# Patient Record
Sex: Female | Born: 1960 | Race: White | Hispanic: No | Marital: Married | Smoking: Former smoker
Health system: Southern US, Community
[De-identification: ages and names within clinical notes are randomized; demographics above are authoritative.]

## PROBLEM LIST (undated history)

## (undated) DIAGNOSIS — E559 Vitamin D deficiency, unspecified: Secondary | ICD-10-CM

## (undated) DIAGNOSIS — M810 Age-related osteoporosis without current pathological fracture: Secondary | ICD-10-CM

## (undated) DIAGNOSIS — R778 Other specified abnormalities of plasma proteins: Secondary | ICD-10-CM

## (undated) DIAGNOSIS — J439 Emphysema, unspecified: Secondary | ICD-10-CM

## (undated) DIAGNOSIS — F419 Anxiety disorder, unspecified: Secondary | ICD-10-CM

## (undated) DIAGNOSIS — E041 Nontoxic single thyroid nodule: Secondary | ICD-10-CM

## (undated) DIAGNOSIS — M2550 Pain in unspecified joint: Secondary | ICD-10-CM

## (undated) DIAGNOSIS — N809 Endometriosis, unspecified: Secondary | ICD-10-CM

## (undated) DIAGNOSIS — N6019 Diffuse cystic mastopathy of unspecified breast: Secondary | ICD-10-CM

## (undated) DIAGNOSIS — N879 Dysplasia of cervix uteri, unspecified: Secondary | ICD-10-CM

## (undated) DIAGNOSIS — J449 Chronic obstructive pulmonary disease, unspecified: Secondary | ICD-10-CM

## (undated) DIAGNOSIS — K219 Gastro-esophageal reflux disease without esophagitis: Secondary | ICD-10-CM

## (undated) HISTORY — DX: Gastro-esophageal reflux disease without esophagitis: K21.9

## (undated) HISTORY — DX: Pain in unspecified joint: M25.50

## (undated) HISTORY — DX: Diffuse cystic mastopathy of unspecified breast: N60.19

## (undated) HISTORY — DX: Endometriosis, unspecified: N80.9

## (undated) HISTORY — DX: Emphysema, unspecified: J43.9

## (undated) HISTORY — DX: Age-related osteoporosis without current pathological fracture: M81.0

## (undated) HISTORY — PX: OTHER SURGICAL HISTORY: SHX169

## (undated) HISTORY — DX: Chronic obstructive pulmonary disease, unspecified: J44.9

## (undated) HISTORY — DX: Nontoxic single thyroid nodule: E04.1

## (undated) HISTORY — DX: Vitamin D deficiency, unspecified: E55.9

## (undated) HISTORY — DX: Other specified abnormalities of plasma proteins: R77.8

## (undated) HISTORY — DX: Anxiety disorder, unspecified: F41.9

## (undated) HISTORY — DX: Dysplasia of cervix uteri, unspecified: N87.9

---

## 1986-01-23 HISTORY — PX: TUBAL LIGATION: SHX77

## 1999-01-24 ENCOUNTER — Other Ambulatory Visit: Admission: RE | Admit: 1999-01-24 | Discharge: 1999-01-24 | Payer: Self-pay | Admitting: Obstetrics & Gynecology

## 2000-02-02 ENCOUNTER — Other Ambulatory Visit: Admission: RE | Admit: 2000-02-02 | Discharge: 2000-02-02 | Payer: Self-pay | Admitting: Obstetrics & Gynecology

## 2000-02-02 ENCOUNTER — Encounter: Admission: RE | Admit: 2000-02-02 | Discharge: 2000-02-02 | Payer: Self-pay | Admitting: Obstetrics & Gynecology

## 2000-02-02 ENCOUNTER — Encounter: Payer: Self-pay | Admitting: Obstetrics & Gynecology

## 2000-02-06 ENCOUNTER — Encounter: Payer: Self-pay | Admitting: Obstetrics & Gynecology

## 2000-02-06 ENCOUNTER — Encounter: Admission: RE | Admit: 2000-02-06 | Discharge: 2000-02-06 | Payer: Self-pay | Admitting: Obstetrics & Gynecology

## 2000-04-17 ENCOUNTER — Other Ambulatory Visit: Admission: RE | Admit: 2000-04-17 | Discharge: 2000-04-17 | Payer: Self-pay | Admitting: Obstetrics & Gynecology

## 2000-04-17 ENCOUNTER — Encounter (INDEPENDENT_AMBULATORY_CARE_PROVIDER_SITE_OTHER): Payer: Self-pay

## 2000-10-11 ENCOUNTER — Other Ambulatory Visit: Admission: RE | Admit: 2000-10-11 | Discharge: 2000-10-11 | Payer: Self-pay | Admitting: Obstetrics & Gynecology

## 2001-05-29 ENCOUNTER — Other Ambulatory Visit: Admission: RE | Admit: 2001-05-29 | Discharge: 2001-05-29 | Payer: Self-pay | Admitting: Obstetrics & Gynecology

## 2002-03-05 ENCOUNTER — Encounter: Payer: Self-pay | Admitting: Obstetrics & Gynecology

## 2002-03-05 ENCOUNTER — Encounter: Admission: RE | Admit: 2002-03-05 | Discharge: 2002-03-05 | Payer: Self-pay | Admitting: Obstetrics & Gynecology

## 2002-06-19 ENCOUNTER — Other Ambulatory Visit: Admission: RE | Admit: 2002-06-19 | Discharge: 2002-06-19 | Payer: Self-pay | Admitting: Obstetrics and Gynecology

## 2003-02-25 ENCOUNTER — Other Ambulatory Visit: Admission: RE | Admit: 2003-02-25 | Discharge: 2003-02-25 | Payer: Self-pay | Admitting: Obstetrics and Gynecology

## 2003-08-26 ENCOUNTER — Encounter: Admission: RE | Admit: 2003-08-26 | Discharge: 2003-08-26 | Payer: Self-pay | Admitting: Obstetrics and Gynecology

## 2004-07-18 ENCOUNTER — Other Ambulatory Visit: Admission: RE | Admit: 2004-07-18 | Discharge: 2004-07-18 | Payer: Self-pay | Admitting: Obstetrics and Gynecology

## 2005-07-10 ENCOUNTER — Ambulatory Visit: Payer: Self-pay | Admitting: Internal Medicine

## 2005-07-16 ENCOUNTER — Ambulatory Visit: Payer: Self-pay | Admitting: Internal Medicine

## 2005-08-02 ENCOUNTER — Other Ambulatory Visit: Admission: RE | Admit: 2005-08-02 | Discharge: 2005-08-02 | Payer: Self-pay | Admitting: Obstetrics and Gynecology

## 2005-08-21 ENCOUNTER — Ambulatory Visit: Payer: Self-pay | Admitting: Internal Medicine

## 2009-08-02 ENCOUNTER — Encounter: Admission: RE | Admit: 2009-08-02 | Discharge: 2009-08-02 | Payer: Self-pay | Admitting: Family Medicine

## 2009-08-02 ENCOUNTER — Other Ambulatory Visit: Admission: RE | Admit: 2009-08-02 | Discharge: 2009-08-02 | Payer: Self-pay | Admitting: Interventional Radiology

## 2009-11-21 ENCOUNTER — Encounter: Admission: RE | Admit: 2009-11-21 | Discharge: 2009-11-21 | Payer: Self-pay | Admitting: Surgery

## 2010-03-08 ENCOUNTER — Ambulatory Visit (HOSPITAL_COMMUNITY): Admission: RE | Admit: 2010-03-08 | Discharge: 2010-03-09 | Payer: Self-pay | Admitting: Surgery

## 2010-03-08 ENCOUNTER — Encounter (INDEPENDENT_AMBULATORY_CARE_PROVIDER_SITE_OTHER): Payer: Self-pay | Admitting: Surgery

## 2010-03-08 HISTORY — PX: THYROID LOBECTOMY: SHX420

## 2010-05-19 ENCOUNTER — Encounter: Admission: RE | Admit: 2010-05-19 | Discharge: 2010-05-19 | Payer: Self-pay | Admitting: Obstetrics and Gynecology

## 2010-11-12 LAB — COMPREHENSIVE METABOLIC PANEL
Alkaline Phosphatase: 96 U/L (ref 39–117)
BUN: 7 mg/dL (ref 6–23)
Chloride: 104 mEq/L (ref 96–112)
Creatinine, Ser: 0.64 mg/dL (ref 0.4–1.2)
Glucose, Bld: 90 mg/dL (ref 70–99)
Potassium: 4.1 mEq/L (ref 3.5–5.1)
Total Bilirubin: 0.4 mg/dL (ref 0.3–1.2)
Total Protein: 7.6 g/dL (ref 6.0–8.3)

## 2010-11-12 LAB — SURGICAL PCR SCREEN
MRSA, PCR: NEGATIVE
Staphylococcus aureus: POSITIVE — AB

## 2010-11-12 LAB — CALCIUM: Calcium: 8.1 mg/dL — ABNORMAL LOW (ref 8.4–10.5)

## 2011-01-12 NOTE — H&P (Signed)
NAME:  Megan Harper, Megan Harper                         ACCOUNT NO.:  [id number]   MEDICAL RECORD NO.:  [id number]                   PATIENT TYPE:  AMB   LOCATION:  [facility]                                  FACILITY:  [facility]   PHYSICIAN:  [place] B. Earlene Plater, M.D.               DATE OF BIRTH:  April 09, 1961   DATE OF ADMISSION:  DATE OF DISCHARGE:                                HISTORY & PHYSICAL   PREOPERATIVE DIAGNOSES:  1. Persistent complex right ovarian cyst.  2. Bilateral lower quadrant abdominal pain.  3. History of endometriosis.   PLANNED PROCEDURE:  Open laparoscopy, right ovarian cystectomy, possible  right salpingo-oophorectomy.   HISTORY OF PRESENT ILLNESS:  A 50 year old white female gravida 3, para 3  with a history of severe dysmenorrhea and associated right lower quadrant  pain which has been increased in the last month.  Recent ultrasound in the  office showed complex right ovarian cyst consistent with endometrioma by  appearance.  The patient with a previous history of endometriosis and  desires surgical therapy as she is taking nonsteroidal pain medications  without relief.   PAST MEDICAL HISTORY:  1. Endometriosis.  2. Cervical dysplasia.  3. Previous urinary tract infection.  4. Fibrocystic breast disease.   PAST SURGICAL HISTORY:  Laparoscopy for endometriosis.   FAMILY HISTORY:  Heart disease, hypertension and mother with cervical  cancer.  Father with lung cancer.   SOCIAL HISTORY:  The patient does smoke 1-1/2 per day.  Occasional alcohol.  No other drugs.   ALLERGIES:  CECLOR.   MEDICATIONS:  Prevacid.   REVIEW OF SYSTEMS:  Otherwise noncontributory.   PHYSICAL EXAMINATION:  VITAL SIGNS:  Blood pressure 120/70.  Pulse 68.  Temperature 98.8.  GENERAL:  The patient is alert and oriented in no acute distress.  SKIN:  Warm, dry, no lesions.  HEART:  Regular rate and rhythm.  LUNGS:  Clear to auscultation.  ABDOMEN:  Liver and spleen normal.  No hernia.  No acute  signs of mild  bilateral lower quadrant tenderness.  Previous laparoscopy and tubal  ligation scar noted.  PELVIC:  Normal external genitalia.  Vagina: small amount of white discharge  noted.  Wet prep showed yeast in the office.  Bimanual exam:  The uterus is  felt enlarged and slightly tender.  No true adnexal mass or tenderness  palpable.   ASSESSMENT:  Abdominal pain with associated complex right ovarian cyst  consistent with endometriosis.  Not responding to Bextra and not a candidate  for contraceptive management as she is a smoker over age 62.  Therefore, she  wishes to proceed with laparoscopy and right ovarian cystectomy.  Also,  discussed with the patient possible need for right salpingo-oophorectomy.  Operative risks discussed including infection, bleeding, damage to  gallbladder and surrounding organs.  All questions were answered and the  patient wished to proceed.  [name] B. Earlene Plater, M.D.    WBD/MEDQ  D:  05/21/2002  T:  05/21/2002  Job:  [phone]

## 2011-04-18 ENCOUNTER — Other Ambulatory Visit: Payer: Self-pay | Admitting: Obstetrics and Gynecology

## 2013-01-09 ENCOUNTER — Encounter: Payer: Self-pay | Admitting: Internal Medicine

## 2013-02-04 ENCOUNTER — Encounter: Payer: Self-pay | Admitting: Internal Medicine

## 2013-02-04 ENCOUNTER — Ambulatory Visit (INDEPENDENT_AMBULATORY_CARE_PROVIDER_SITE_OTHER): Payer: 59 | Admitting: Internal Medicine

## 2013-02-04 VITALS — BP 104/62 | HR 68 | Ht 66.25 in | Wt 175.0 lb

## 2013-02-04 DIAGNOSIS — R1011 Right upper quadrant pain: Secondary | ICD-10-CM

## 2013-02-04 DIAGNOSIS — R1013 Epigastric pain: Secondary | ICD-10-CM

## 2013-02-04 DIAGNOSIS — R141 Gas pain: Secondary | ICD-10-CM

## 2013-02-04 DIAGNOSIS — K219 Gastro-esophageal reflux disease without esophagitis: Secondary | ICD-10-CM

## 2013-02-04 DIAGNOSIS — K5900 Constipation, unspecified: Secondary | ICD-10-CM

## 2013-02-04 DIAGNOSIS — R1310 Dysphagia, unspecified: Secondary | ICD-10-CM

## 2013-02-04 MED ORDER — MOVIPREP 100 G PO SOLR: 1.0000 | Freq: Once | ORAL | Status: DC

## 2013-02-04 NOTE — Patient Instructions (Addendum)
   You have been scheduled for an abdominal ultrasound at [facility] (1st floor of hospital) on 02-06-13 at 9:00am. Please arrive 15 minutes prior to your appointment for registration. Make certain not to have anything to eat or drink after midnight prior to your appointment. Should you need to reschedule your appointment, please contact radiology at [phone]. This test typically takes about 30 minutes to perform.   You have been scheduled for an endoscopy and colonoscopy with propofol. Please follow the written instructions given to you at your visit today.  Please pick up your prep at the pharmacy within the next 1-3 days.  If you use inhalers (even only as needed), please bring them with you on the day of your procedure.  Your physician has requested that you go to www.startemmi.com and enter the access code given to you at your visit today. This web site gives a general overview about your procedure. However, you should still follow specific instructions given to you by our office regarding your preparation for the procedure.  Continue taking your Prilosec - make sure to take it daily.  You have been given a reflux

## 2013-02-04 NOTE — Progress Notes (Signed)
HISTORY OF PRESENT ILLNESS:  Megan Harper is a 52 y.o. female with postop hypothyroidism, anxiety, GERD, and IBS. Patient has not been seen in this office since 2006. Previous evaluations included a normal upper endoscopy and flexible sigmoidoscopy in 1999. As well, normal abdominal ultrasound 2006. She presents today with a myriad of GI complaints. First, chronic bloating which has worsened in recent months. Associated with this has been a tendency toward constipation with bowel movements every few days. Next, she reports reflux symptoms if she misses doses of PPI. She does report an episode of regurgitation with coughing at night. She is concerned about halitosis. She is also had some intermittent food and pill dysphagia for 6 months. Finally, she reports a one-year history of recurrent epigastric and right upper cautery pain with radiation to the back. She has this several times per week it last for several hours. She cannot identify any exacerbating or relieving factors. She denies melena or hematochezia. She has had over 30 pound weight gain since 2006. Review of outside laboratories from January 2014 reveal normal CBC, comprehensive metabolic panel, and thyroid testing . REVIEW OF SYSTEMS:  All non-GI ROS negative except for anxiety, back pain, confusion, fatigue, shortness of breath, itching, leg swelling, excessive urination  Past Medical History  Diagnosis Date  . GERD (gastroesophageal reflux disease)   . Endometriosis   . Fibrocystic breast disease   . Cervical dysplasia   . Thyroid nodule   . Anxiety   . COPD (chronic obstructive pulmonary disease)     Past Surgical History  Procedure Laterality Date  . Laproscopy for endometriosis    . Thyroid lobectomy Right 03/08/2010  . Tubal ligation  01/23/1986    Social History SIBBIE FLAMMIA  reports that she quit smoking about 3 years ago. Her smoking use included Cigarettes. She has a 77.5 pack-year smoking history. She has never  used smokeless tobacco. She reports that she does not drink alcohol or use illicit drugs.  family history includes Colon cancer in her maternal grandfather; Diabetes in her paternal grandmother; Heart disease in her maternal grandmother, maternal uncle, and paternal grandmother; Hypertension in her maternal grandfather and maternal grandmother; Kidney cancer in her maternal grandfather and maternal uncle; Lung cancer in her father; and Uterine cancer in her mother.  No Known Allergies     PHYSICAL EXAMINATION: Vital signs: BP 104/62  Pulse 68  Ht 5' 6.25" (1.683 m)  Wt 175 lb (79.379 kg)  BMI 28.02 kg/m2  Constitutional: Obese, generally well-appearing, no acute distress Psychiatric: alert and oriented x3, cooperative Eyes: extraocular movements intact, anicteric, conjunctiva pink Mouth: oral pharynx moist, no lesions Neck: supple no lymphadenopathy Cardiovascular: heart regular rate and rhythm, no murmur Lungs: clear to auscultation bilaterally Abdomen: soft, obese, nontender, nondistended, no obvious ascites, no peritoneal signs, normal bowel sounds, no organomegaly Rectal: Deferred until colonoscopy Extremities: no lower extremity edema bilaterally Skin: no lesions on visible extremities Neuro: No focal deficits.   ASSESSMENT:  #1. Constipation predominant IBS #2. Colon cancer screening. Appropriate candidate for screening colonoscopy without contraindication. No prior colonoscopy #3. GERD. #4. Intermittent solid food/pill dysphagia. Rule out stricture #5. Intermittent epigastric and right upper quadrant pain. Rule out stones.   PLAN:  #1. Reflux precautions with attention to weight loss #2. Take PPI dosage daily. May have to increase dose if breakthrough occurs on regular medication #3. Upper endoscopy to evaluate recurrent abdominal pain as well as new onset dysphagia. Possible esophageal dilation.The nature of the procedure, as well  as the risks, benefits, and  alternatives were carefully and thoroughly reviewed with the patient. Ample time for discussion and questions allowed. The patient understood, was satisfied, and agreed to proceed. #4. Screening colonoscopy.The nature of the procedure, as well as the risks, benefits, and alternatives were carefully and thoroughly reviewed with the patient. Ample time for discussion and questions allowed. The patient understood, was satisfied, and agreed to proceed. We will perform this at the same time endoscopy for her convenience. #5. Movi prep prescribed. The patient instructed on his use #6. Abdominal ultrasound rule out gallstones #7. Consider Linzess for constipation predominant IBS after endoscopic evaluations completed

## 2013-02-06 ENCOUNTER — Telehealth: Payer: Self-pay | Admitting: Internal Medicine

## 2013-02-06 ENCOUNTER — Ambulatory Visit (HOSPITAL_COMMUNITY)
Admission: RE | Admit: 2013-02-06 | Discharge: 2013-02-06 | Disposition: A | Discharge status: Home / Self Care | Payer: 59 | Source: Ambulatory Visit | Attending: Internal Medicine | Admitting: Internal Medicine

## 2013-02-06 DIAGNOSIS — R1310 Dysphagia, unspecified: Secondary | ICD-10-CM

## 2013-02-06 DIAGNOSIS — R141 Gas pain: Secondary | ICD-10-CM

## 2013-02-06 DIAGNOSIS — K219 Gastro-esophageal reflux disease without esophagitis: Secondary | ICD-10-CM

## 2013-02-06 DIAGNOSIS — R1011 Right upper quadrant pain: Secondary | ICD-10-CM

## 2013-02-06 DIAGNOSIS — R1013 Epigastric pain: Secondary | ICD-10-CM

## 2013-02-06 DIAGNOSIS — K5900 Constipation, unspecified: Secondary | ICD-10-CM

## 2013-02-06 NOTE — Telephone Encounter (Signed)
LM on VM.

## 2013-02-09 NOTE — Telephone Encounter (Signed)
Told patient I would mail a moviprep voucher to her.  Patient acknowledged and understood

## 2013-02-09 NOTE — Telephone Encounter (Signed)
Called and gave pt results of ultrasound. Pt states she was waiting to hear back regarding moviprep alternative.

## 2013-03-09 ENCOUNTER — Encounter: Payer: 59 | Admitting: Internal Medicine

## 2013-04-23 ENCOUNTER — Other Ambulatory Visit: Payer: Self-pay | Admitting: Obstetrics & Gynecology

## 2014-04-30 ENCOUNTER — Other Ambulatory Visit: Payer: Self-pay | Admitting: Obstetrics and Gynecology

## 2014-05-04 LAB — CYTOLOGY - PAP

## 2014-07-19 ENCOUNTER — Other Ambulatory Visit (HOSPITAL_COMMUNITY): Payer: Self-pay | Admitting: Respiratory Therapy

## 2014-07-19 DIAGNOSIS — J441 Chronic obstructive pulmonary disease with (acute) exacerbation: Secondary | ICD-10-CM

## 2014-07-21 ENCOUNTER — Encounter: Payer: Self-pay | Admitting: Internal Medicine

## 2014-08-03 ENCOUNTER — Ambulatory Visit (HOSPITAL_COMMUNITY)
Admission: RE | Admit: 2014-08-03 | Discharge: 2014-08-03 | Disposition: A | Discharge status: Home / Self Care | Payer: 59 | Source: Ambulatory Visit | Attending: Family Medicine | Admitting: Family Medicine

## 2014-08-03 DIAGNOSIS — J441 Chronic obstructive pulmonary disease with (acute) exacerbation: Secondary | ICD-10-CM | POA: Insufficient documentation

## 2014-08-03 LAB — PULMONARY FUNCTION TEST
DL/VA % PRED: 102 %
DL/VA: 5.16 ml/min/mmHg/L
DLCO COR: 10.66 ml/min/mmHg
DLCO UNC % PRED: 39 %
DLCO UNC: 10.66 ml/min/mmHg
DLCO cor % pred: 39 %
FEF 25-75 POST: 1.27 L/s
FEF 25-75 PRE: 0.82 L/s
FEF2575-%Change-Post: 53 %
FEF2575-%PRED-POST: 45 %
FEF2575-%Pred-Pre: 29 %
FEV1-%Change-Post: 10 %
FEV1-%Pred-Post: 57 %
FEV1-%Pred-Pre: 52 %
FEV1-PRE: 1.55 L
FEV1-Post: 1.7 L
FEV1FVC-%Change-Post: 5 %
FEV1FVC-%PRED-PRE: 83 %
FEV6-%CHANGE-POST: 6 %
FEV6-%PRED-POST: 64 %
FEV6-%Pred-Pre: 61 %
FEV6-POST: 2.35 L
FEV6-Pre: 2.21 L
FEV6FVC-%CHANGE-POST: 2 %
FEV6FVC-%PRED-POST: 101 %
FEV6FVC-%Pred-Pre: 99 %
FVC-%CHANGE-POST: 4 %
FVC-%PRED-POST: 64 %
FVC-%PRED-PRE: 61 %
FVC-POST: 2.4 L
FVC-Pre: 2.31 L
PRE FEV1/FVC RATIO: 67 %
Post FEV1/FVC ratio: 71 %
Post FEV6/FVC ratio: 98 %
Pre FEV6/FVC Ratio: 96 %
RV % pred: 122 %
RV: 2.37 L
TLC % pred: 89 %
TLC: 4.8 L

## 2014-08-03 MED ORDER — ALBUTEROL SULFATE (2.5 MG/3ML) 0.083% IN NEBU
2.5000 mg | INHALATION_SOLUTION | Freq: Once | RESPIRATORY_TRACT | Status: AC
  Administered 2014-08-03: 2.5 mg via RESPIRATORY_TRACT

## 2014-09-13 ENCOUNTER — Ambulatory Visit: Payer: 59

## 2014-09-13 VITALS — Ht 66.5 in | Wt 168.0 lb

## 2014-09-13 DIAGNOSIS — R1013 Epigastric pain: Secondary | ICD-10-CM

## 2014-09-13 DIAGNOSIS — K219 Gastro-esophageal reflux disease without esophagitis: Secondary | ICD-10-CM

## 2014-09-13 NOTE — Progress Notes (Signed)
No allergies to eggs or soy No past problems with anesthesia No home oxygen No diet/weight loss meds  Has email  Emmi instructions given for colonoscopy 

## 2014-09-27 ENCOUNTER — Ambulatory Visit (AMBULATORY_SURGERY_CENTER): Payer: 59 | Admitting: Internal Medicine

## 2014-09-27 ENCOUNTER — Encounter: Payer: Self-pay | Admitting: Internal Medicine

## 2014-09-27 VITALS — BP 114/64 | HR 71 | Temp 97.8°F | Resp 23 | Ht 66.0 in | Wt 168.0 lb

## 2014-09-27 DIAGNOSIS — D125 Benign neoplasm of sigmoid colon: Secondary | ICD-10-CM

## 2014-09-27 DIAGNOSIS — R1013 Epigastric pain: Secondary | ICD-10-CM

## 2014-09-27 DIAGNOSIS — K635 Polyp of colon: Secondary | ICD-10-CM

## 2014-09-27 DIAGNOSIS — R1314 Dysphagia, pharyngoesophageal phase: Secondary | ICD-10-CM

## 2014-09-27 DIAGNOSIS — Z1211 Encounter for screening for malignant neoplasm of colon: Secondary | ICD-10-CM

## 2014-09-27 DIAGNOSIS — K219 Gastro-esophageal reflux disease without esophagitis: Secondary | ICD-10-CM

## 2014-09-27 MED ORDER — SODIUM CHLORIDE 0.9 % IV SOLN: 500.0000 mL | INTRAVENOUS | Status: DC

## 2014-09-27 NOTE — Op Note (Signed)
[facility]  [address] [place], [address]   ENDOSCOPY PROCEDURE REPORT  PATIENT: Megan Harper, Megan Harper  MR#: [id number] BIRTHDATE: March 18, 1961 , 71  yrs. old GENDER: female ENDOSCOPIST: Eustace Quail, MD REFERRED BY:  .  Self / Office PROCEDURE DATE:  09/27/2014 PROCEDURE:  EGD, diagnostic ASA CLASS:     Class II INDICATIONS:  history of esophageal reflux and dysphagia. MEDICATIONS: Monitored anesthesia care and Propofol 90 mg IV TOPICAL ANESTHETIC: none  DESCRIPTION OF PROCEDURE: After the risks benefits and alternatives of the procedure were thoroughly explained, informed consent was obtained.  The LB [id number] endoscope was introduced through the mouth and advanced to the second portion of the duodenum , Without limitations.  The instrument was slowly withdrawn as the mucosa was fully examined.    EXAM: The esophagus and gastroesophageal junction were completely normal in appearance.  The stomach was entered and closely examined.The antrum, angularis, and lesser curvature were well visualized, including a retroflexed view of the cardia and fundus. The stomach wall was normally distensable.  The scope passed easily through the pylorus into the duodenum.  Retroflexed views revealed no abnormalities.     The scope was then withdrawn from the patient and the procedure completed.  COMPLICATIONS: There were no immediate complications.  ENDOSCOPIC IMPRESSION: 1. Normal EGD 2. GERD 3. IBS  RECOMMENDATIONS: 1.  Anti-reflux regimen to be followed 2.  Continue omeprazole for acid reflux symptoms  REPEAT EXAM:  eSigned:  Eustace Quail, MD 09/27/2014 10:15 AM    MV:HQIONG Laurann Montana, MD and The Patient

## 2014-09-27 NOTE — Progress Notes (Signed)
Report to PACU, RN, vss, BBS= Clear.  

## 2014-09-27 NOTE — Patient Instructions (Signed)
YOU HAD AN ENDOSCOPIC PROCEDURE TODAY AT THE [facility]: Refer to the procedure report that was given to you for any specific questions about what was found during the examination.  If the procedure report does not answer your questions, please call your gastroenterologist to clarify.  If you requested that your care partner not be given the details of your procedure findings, then the procedure report has been included in a sealed envelope for you to review at your convenience later.  YOU SHOULD EXPECT: Some feelings of bloating in the abdomen. Passage of more gas than usual.  Walking can help get rid of the air that was put into your GI tract during the procedure and reduce the bloating. If you had a lower endoscopy (such as a colonoscopy or flexible sigmoidoscopy) you may notice spotting of blood in your stool or on the toilet paper. If you underwent a bowel prep for your procedure, then you may not have a normal bowel movement for a few days.  DIET: Your first meal following the procedure should be a light meal and then it is ok to progress to your normal diet.  A half-sandwich or bowl of soup is an example of a good first meal.  Heavy or fried foods are harder to digest and may make you feel nauseous or bloated.  Likewise meals heavy in dairy and vegetables can cause extra gas to form and this can also increase the bloating.  Drink plenty of fluids but you should avoid alcoholic beverages for 24 hours.  ACTIVITY: Your care partner should take you home directly after the procedure.  You should plan to take it easy, moving slowly for the rest of the day.  You can resume normal activity the day after the procedure however you should NOT DRIVE or use heavy machinery for 24 hours (because of the sedation medicines used during the test).    SYMPTOMS TO REPORT IMMEDIATELY: A gastroenterologist can be reached at any hour.  During normal business hours, 8:30 AM to 5:00 PM Monday through Friday,  call [phone].  After hours and on weekends, please call the GI answering service at [phone] who will take a message and have the physician on call contact you.   Following lower endoscopy (colonoscopy or flexible sigmoidoscopy):  Excessive amounts of blood in the stool  Significant tenderness or worsening of abdominal pains  Swelling of the abdomen that is new, acute  Fever of 100F or higher  Following upper endoscopy (EGD)  Vomiting of blood or coffee ground material  New chest pain or pain under the shoulder blades  Painful or persistently difficult swallowing  New shortness of breath  Fever of 100F or higher  Black, tarry-looking stools  FOLLOW UP: If any biopsies were taken you will be contacted by phone or by letter within the next 1-3 weeks.  Call your gastroenterologist if you have not heard about the biopsies in 3 weeks.  Our staff will call the home number listed on your records the next business day following your procedure to check on you and address any questions or concerns that you may have at that time regarding the information given to you following your procedure. This is a courtesy call and so if there is no answer at the home number and we have not heard from you through the emergency physician on call, we will assume that you have returned to your regular daily activities without incident.  SIGNATURES/CONFIDENTIALITY: You and/or your care   partner have signed paperwork which will be entered into your electronic medical record.  These signatures attest to the fact that that the information above on your After Visit Summary has been reviewed and is understood.  Full responsibility of the confidentiality of this discharge information lies with you and/or your care-partner.  Recommendations Next routine colonoscopy determined by pathology results; 5 years or 10 years. Discharge instructions given to patient and/or care partner. Polyp and GERD handouts  provided.

## 2014-09-27 NOTE — Op Note (Signed)
[facility]  [address]. [place], [address]   COLONOSCOPY PROCEDURE REPORT  PATIENT: Megan, Harper  MR#: [id number] BIRTHDATE: Jan 16, 1961 , 82  yrs. old GENDER: female ENDOSCOPIST: Eustace Quail, MD REFERRED BY:.  Self / Office PROCEDURE DATE:  09/27/2014 PROCEDURE:   Colonoscopy with snare polypectomy x 1 First Screening Colonoscopy - Avg.  risk and is 50 yrs.  old or older Yes.  Prior Negative Screening - Now for repeat screening. N/A  History of Adenoma - Now for follow-up colonoscopy & has been > or = to 3 yrs.  N/A  Polyps Removed Today? Yes. ASA CLASS:   Class II INDICATIONS:average risk for colorectal cancer. MEDICATIONS: Monitored anesthesia care and Propofol 250 mg IV  DESCRIPTION OF PROCEDURE:   After the risks benefits and alternatives of the procedure were thoroughly explained, informed consent was obtained.  The digital rectal exam revealed no abnormalities of the rectum.   The LB [id number]  endoscope was introduced through the anus and advanced to the cecum, which was identified by both the appendix and ileocecal valve. No adverse events experienced.   The quality of the prep was good, using MoviPrep  The instrument was then slowly withdrawn as the colon was fully examined.     COLON FINDINGS: A single polyp measuring 3 mm in size was found in the sigmoid colon.  A polypectomy was performed with a cold snare. The resection was complete, the polyp tissue was completely retrieved and sent to histology.   The examination was otherwise normal.  Retroflexed views revealed no abnormalities. The time to cecum=2 minutes 31 seconds.  Withdrawal time=11 minutes 56 seconds. The scope was withdrawn and the procedure completed. COMPLICATIONS: There were no immediate complications.  ENDOSCOPIC IMPRESSION: 1.   Single polyp measuring 3 mm in size was found in the sigmoid colon; polypectomy was performed with a cold snare 2.   The examination  was otherwise normal  RECOMMENDATIONS: 1.  Repeat colonoscopy in 5 years if polyp adenomatous; otherwise 10 years 2.  Upper endoscopy performed today (please see report)  eSigned:  Eustace Quail, MD 09/27/2014 10:12 AM   cc: Kelton Pillar, MD and The Patient

## 2014-09-27 NOTE — Progress Notes (Signed)
Called to room to assist during endoscopic procedure.  Patient ID and intended procedure confirmed with present staff. Received instructions for my participation in the procedure from the performing physician.  

## 2014-09-28 ENCOUNTER — Telehealth: Payer: Self-pay

## 2014-09-28 NOTE — Telephone Encounter (Signed)
  Follow up Call-  Call back number 09/27/2014  Post procedure Call Back phone  # [phone]  Permission to leave phone message Yes     Patient questions:  Do you have a fever, pain , or abdominal swelling? No. Pain Score  0 *  Have you tolerated food without any problems? Yes.    Have you been able to return to your normal activities? Yes.    Do you have any questions about your discharge instructions: Diet   No. Medications  No. Follow up visit  No.  Do you have questions or concerns about your Care? No.  Actions: * If pain score is 4 or above: No action needed, pain <4.

## 2014-09-30 ENCOUNTER — Encounter: Payer: Self-pay | Admitting: Internal Medicine

## 2014-11-24 ENCOUNTER — Other Ambulatory Visit: Payer: Self-pay | Admitting: Family Medicine

## 2014-11-24 ENCOUNTER — Ambulatory Visit
Admission: RE | Admit: 2014-11-24 | Discharge: 2014-11-24 | Disposition: A | Discharge status: Home / Self Care | Payer: 59 | Source: Ambulatory Visit | Attending: Family Medicine | Admitting: Family Medicine

## 2014-11-24 DIAGNOSIS — M546 Pain in thoracic spine: Secondary | ICD-10-CM

## 2015-05-17 ENCOUNTER — Other Ambulatory Visit: Payer: Self-pay | Admitting: Obstetrics and Gynecology

## 2015-05-18 LAB — CYTOLOGY - PAP

## 2015-08-23 ENCOUNTER — Telehealth: Payer: Self-pay | Admitting: Internal Medicine

## 2015-08-23 NOTE — Telephone Encounter (Signed)
Patient advised that unfortunately the facility fee is part of the procedure charges and can't be separated.  I did apologized that she was misinformed about the information.  I did advise her I will have my director look into the billing.

## 2015-09-05 ENCOUNTER — Institutional Professional Consult (permissible substitution): Payer: 59 | Admitting: Pulmonary Disease

## 2015-10-12 ENCOUNTER — Institutional Professional Consult (permissible substitution): Payer: 59 | Admitting: Internal Medicine

## 2015-10-17 ENCOUNTER — Institutional Professional Consult (permissible substitution): Payer: 59 | Admitting: Internal Medicine

## 2015-10-28 ENCOUNTER — Ambulatory Visit (INDEPENDENT_AMBULATORY_CARE_PROVIDER_SITE_OTHER)
Admission: RE | Admit: 2015-10-28 | Discharge: 2015-10-28 | Disposition: A | Discharge status: Home / Self Care | Payer: 59 | Source: Ambulatory Visit | Attending: Internal Medicine | Admitting: Internal Medicine

## 2015-10-28 ENCOUNTER — Other Ambulatory Visit (INDEPENDENT_AMBULATORY_CARE_PROVIDER_SITE_OTHER): Payer: 59

## 2015-10-28 ENCOUNTER — Telehealth: Payer: Self-pay | Admitting: Internal Medicine

## 2015-10-28 ENCOUNTER — Ambulatory Visit (INDEPENDENT_AMBULATORY_CARE_PROVIDER_SITE_OTHER): Payer: 59 | Admitting: Internal Medicine

## 2015-10-28 ENCOUNTER — Encounter: Payer: Self-pay | Admitting: Internal Medicine

## 2015-10-28 VITALS — BP 124/84 | HR 72 | Ht 66.0 in | Wt 181.8 lb

## 2015-10-28 DIAGNOSIS — R05 Cough: Secondary | ICD-10-CM

## 2015-10-28 DIAGNOSIS — R0600 Dyspnea, unspecified: Secondary | ICD-10-CM

## 2015-10-28 DIAGNOSIS — R058 Other specified cough: Secondary | ICD-10-CM

## 2015-10-28 LAB — CBC WITH DIFFERENTIAL/PLATELET
BASOS PCT: 0.8 % (ref 0.0–3.0)
Basophils Absolute: 0.1 10*3/uL (ref 0.0–0.1)
Eosinophils Absolute: 0.1 10*3/uL (ref 0.0–0.7)
Eosinophils Relative: 1.7 % (ref 0.0–5.0)
HEMATOCRIT: 40.8 % (ref 36.0–46.0)
Hemoglobin: 13.8 g/dL (ref 12.0–15.0)
Lymphocytes Relative: 28.5 % (ref 12.0–46.0)
Lymphs Abs: 2.2 10*3/uL (ref 0.7–4.0)
MCHC: 33.7 g/dL (ref 30.0–36.0)
MCV: 93.8 fl (ref 78.0–100.0)
MONOS PCT: 5.8 % (ref 3.0–12.0)
Monocytes Absolute: 0.4 10*3/uL (ref 0.1–1.0)
Neutro Abs: 4.8 10*3/uL (ref 1.4–7.7)
Neutrophils Relative %: 63.2 % (ref 43.0–77.0)
Platelets: 321 10*3/uL (ref 150.0–400.0)
RBC: 4.35 Mil/uL (ref 3.87–5.11)
RDW: 12.9 % (ref 11.5–15.5)
WBC: 7.6 10*3/uL (ref 4.0–10.5)

## 2015-10-28 MED ORDER — ALBUTEROL SULFATE HFA 108 (90 BASE) MCG/ACT IN AERS: 2.0000 | INHALATION_SPRAY | Freq: Four times a day (QID) | RESPIRATORY_TRACT | Status: DC | PRN

## 2015-10-28 MED ORDER — FAMOTIDINE 20 MG PO TABS: ORAL_TABLET | ORAL | Status: DC

## 2015-10-28 MED ORDER — PANTOPRAZOLE SODIUM 40 MG PO TBEC: 40.0000 mg | DELAYED_RELEASE_TABLET | Freq: Every day | ORAL | Status: DC

## 2015-10-28 NOTE — Telephone Encounter (Signed)
Left message for patient to call back  

## 2015-10-28 NOTE — Progress Notes (Signed)
Subjective:    Patient ID: Megan Harper, female    DOB: [birth year],    MRN: [id number]  HPI  59 yowm quit smoking 2011 with mild sob resolved and fine until thryroid surgery same year then gradual worse breathing since assoc with wt gain from baseline of 140 up to 180's and referred to pulmonary clinic 10/28/2015 by Dr Laurann Montana p rec symbicort/alb trial  but never  Started it.    10/28/2015 1st [facility] office visit/ Megan Harper   Chief Complaint  Patient presents with  . Pulmonary Consult    Referred by Dr. Kelton Pillar.  Pt c/o SOB since 2010, worse for the past year. She states she was dxed with COPD. She gets SOB with just talking sometimes. She also c/o occ '"catch" in her chest and non prod cough.   in the last 7 y  is aware of a problem daily with variable pattern overall progressive sob but not continous , sometimes difficulty across the room / unaware while asleep Onset / severity related to reflux severity per pt  No correlation with cough but she does some problems cough  with laughing causing cough sometimes to point of choking  Nose always stopped up for years does not correlate with symptoms of sob or cough  No obvious other patterns in day to day or daytime variabilty or assoc  cp or chest tightness, subjective wheeze overt   hb symptoms. No unusual exp hx or h/o childhood pna/ asthma or knowledge of premature birth.  Sleeping ok without nocturnal  or early am exacerbation  of respiratory  c/o's or need for noct saba. Also denies any obvious fluctuation of symptoms with weather or environmental changes or other aggravating or alleviating factors except as outlined above   Current Medications, Allergies, Complete Past Medical History, Past Surgical History, Family History, and Social History were reviewed in [employer] record.               Review of Systems  Constitutional: Negative for fever, chills and unexpected weight change.  HENT:  Positive for congestion and sinus pressure. Negative for dental problem, ear pain, nosebleeds, postnasal drip, rhinorrhea, sneezing, sore throat, trouble swallowing and voice change.   Eyes: Negative for visual disturbance.  Respiratory: Positive for cough and shortness of breath. Negative for choking.   Cardiovascular: Positive for chest pain and leg swelling.  Gastrointestinal: Negative for vomiting, abdominal pain and diarrhea.  Genitourinary: Negative for difficulty urinating.  Musculoskeletal: Negative for arthralgias.  Skin: Negative for rash.  Neurological: Positive for headaches. Negative for tremors and syncope.  Hematological: Does not bruise/bleed easily.       Objective:   Physical Exam  amb wf nad  Prominent pseudowheeze with fvc    Wt Readings from Last 3 Encounters:  10/28/15 181 lb 12.8 oz (82.464 kg)  09/27/14 168 lb (76.204 kg)  09/13/14 168 lb (76.204 kg)    Vital signs reviewed     HEENT: nl dentition, turbinates, and oropharynx. Nl external ear canals without cough reflex   NECK :  without JVD/Nodes/TM/ nl carotid upstrokes bilaterally   LUNGS: no acc muscle use,  Nl contour chest which is clear to A and P bilaterally without cough on insp or exp maneuvers   CV:  RRR  no s3 or murmur or increase in P2, no edema   ABD:  Obese / soft and nontender with nl inspiratory excursion in the supine position. No bruits or organomegaly, bowel sounds  nl  MS:  Nl gait/ ext warm without deformities, calf tenderness, cyanosis or clubbing No obvious joint restrictions   SKIN: warm and dry without lesions    NEURO:  alert, approp, nl sensorium with  no motor deficits     CXR PA and Lateral:   10/28/2015 :    I personally reviewed images and agree with radiology impression as follows:    Lung volumes are within normal limits. Both lungs are clear without airspace disease or pulmonary edema. Heart and mediastinum are within normal limits. Trachea is midline. Mild  degenerative changes in thoracic spine. No pleural effusions.   Labs ordered/ reviewed 10/28/2015 :   Lab Results  Component Value Date   WBC 7.6 10/28/2015   HGB 13.8 10/28/2015   HCT 40.8 10/28/2015   MCV 93.8 10/28/2015   PLT 321.0 10/28/2015     Allergy profile         Assessment & Plan:

## 2015-10-28 NOTE — Patient Instructions (Signed)
Please remember to go to the lab and x-ray department downstairs for your tests - we will call you with the results when they are available.    GERD (REFLUX)  is an extremely common cause of respiratory symptoms just like yours , many times with no obvious heartburn at all.    It can be treated with medication, but also with lifestyle changes including elevation of the head of your bed (ideally with 6 inch  bed blocks),  Smoking cessation, avoidance of late meals, excessive alcohol, and avoid fatty foods, chocolate, peppermint, colas, red wine, and acidic juices such as orange juice.  NO MINT OR MENTHOL PRODUCTS SO NO COUGH DROPS  USE SUGARLESS CANDY INSTEAD (Jolley ranchers or Stover's or Life Savers) or even ice chips will also do - the key is to swallow to prevent all throat clearing. NO OIL BASED VITAMINS - use powdered substitutes.    Pantoprazole (protonix) 40 mg   Take  30-60 min before first meal of the day and Pepcid (famotidine)  20 mg one @  bedtime until return to office - this is the best way to tell whether stomach acid is contributing to your problem.    Please schedule a follow up office visit in 6 weeks, call sooner if needed

## 2015-10-28 NOTE — Telephone Encounter (Signed)
Patient states that she saw Dr. Melvyn Novas and he was going to send in a rescue inhaler for her to use for emergency. She said that his nurse told her she would send it, but pharmacy never received Rx. Rx for Albuterol rescue inhaler was sent to pharmacy. Nothing further needed.

## 2015-10-29 NOTE — Assessment & Plan Note (Signed)
PFT's  07/19/14   FEV1 1.70 (57 % ) ratio 71  p 10 % improvement from saba with DLCO  39 % corrects to 102% for alv vol ERV 40%  -  10/28/2015  Walked RA x 3 laps @ 185 ft each stopped due to  End of study, fast  pace, min sob no desat    Symptoms are markedly disproportionate to objective findings and not clear this is a lung problem but pt does appear to have difficult airway management issues. DDX of  difficult airways management almost all start with A and  include Adherence, Ace Inhibitors, Acid Reflux, Active Sinus Disease, Alpha 1 Antitripsin deficiency, Anxiety masquerading as Airways dz,  ABPA,  Allergy(esp in young), Aspiration (esp in elderly), Adverse effects of meds,  Active smokers, A bunch of PE's (a small clot burden can't cause this syndrome unless there is already severe underlying pulm or vascular dz with poor reserve) plus two Bs  = Bronchiectasis and Beta blocker use..and one C= CHF   Adherence is always the initial "prime suspect" and is a multilayered concern that requires a "trust but verify" approach in every patient - starting with knowing how to use medications, especially inhalers, correctly, keeping up with refills and understanding the fundamental difference between maintenance and prns vs those medications only taken for a very short course and then stopped and not refilled.   ? Acid (or non-acid) GERD > always difficult to exclude as up to 75% of pts in some series report no assoc GI/ Heartburn symptoms and GERD present in 100% with symptoms of HB which is the case with her > rec max (24h)  acid suppression and diet restrictions/ reviewed and instructions given in writing.   ? Allergy > profile sent   ? Anxiety > certainly may explain the disproportionality of her symptoms  I had an extended discussion with the patient reviewing all relevant studies completed to date and  lasting  35 minutes of a 60 minute visit    Each maintenance medication was reviewed in detail  including most importantly the difference between maintenance and prns and under what circumstances the prns are to be triggered using an action plan format that is not reflected in the computer generated alphabetically organized AVS.    Please see instructions for details which were reviewed in writing and the patient given a copy highlighting the part that I personally wrote and discussed at today's ov.

## 2015-10-29 NOTE — Assessment & Plan Note (Signed)
Many of her symptoms strongly suggest  Classic Upper airway cough syndrome, so named because it's frequently impossible to sort out how much is  CR/sinusitis with freq throat clearing (which can be related to primary GERD)   vs  causing  secondary (" extra esophageal")  GERD from wide swings in gastric pressure that occur with throat clearing, often  promoting self use of mint and menthol lozenges that reduce the lower esophageal sphincter tone and exacerbate the problem further in a cyclical fashion.   These are the same pts (now being labeled as having "irritable larynx syndrome" by some cough centers) who not infrequently have a history of having failed to tolerate ace inhibitors,  dry powder inhalers or biphosphonates or report having atypical reflux symptoms that don't respond to standard doses of PPI , and are easily confused as having aecopd or asthma flares by even experienced allergists/ pulmonologists.   For now focus on gerd rx then regroup in 6 weeks

## 2015-10-31 LAB — RESPIRATORY ALLERGY PROFILE REGION II ~~LOC~~
Allergen, Cedar tree, t12: <0.1 kU/L
Allergen, Comm Silver Birch, t9: <0.1 kU/L
Allergen, Cottonwood, t14: <0.1 kU/L
Allergen, Mulberry, t76: <0.1 kU/L
Alternaria Alternata: <0.1 kU/L
Aspergillus fumigatus, m3: <0.1 kU/L
Bermuda Grass: <0.1 kU/L
Box Elder IgE: <0.1 kU/L
Cladosporium Herbarum: <0.1 kU/L
Cockroach: <0.1 kU/L
Common Ragweed: <0.1 kU/L
D. farinae: <0.1 kU/L
Dog Dander: <0.1 kU/L
IgE (Immunoglobulin E), Serum: 6 kU/L (ref <115)
Johnson Grass: <0.1 kU/L
Penicillium Notatum: <0.1 kU/L

## 2015-10-31 NOTE — Progress Notes (Signed)
Quick Note:  Spoke with pt and notified of results per Dr. Wert. Pt verbalized understanding and denied any questions.  ______ 

## 2015-11-01 NOTE — Progress Notes (Signed)
Quick Note:  Spoke with pt and notified of results per Dr. Wert. Pt verbalized understanding and denied any questions.  ______ 

## 2015-11-01 NOTE — Telephone Encounter (Signed)
I spoke with the patient again and advised her that we could not change the coding for her procedures, nor can we cancel the facility fes.  She says she will appeal her bill with [facility]

## 2015-12-12 ENCOUNTER — Encounter: Payer: Self-pay | Admitting: Internal Medicine

## 2015-12-12 ENCOUNTER — Ambulatory Visit (INDEPENDENT_AMBULATORY_CARE_PROVIDER_SITE_OTHER): Payer: 59 | Admitting: Internal Medicine

## 2015-12-12 VITALS — BP 134/72 | HR 83 | Ht 66.0 in | Wt 183.0 lb

## 2015-12-12 DIAGNOSIS — R05 Cough: Secondary | ICD-10-CM

## 2015-12-12 DIAGNOSIS — R0600 Dyspnea, unspecified: Secondary | ICD-10-CM | POA: Diagnosis not present

## 2015-12-12 DIAGNOSIS — R058 Other specified cough: Secondary | ICD-10-CM

## 2015-12-12 NOTE — Progress Notes (Signed)
Subjective:    Patient ID: Megan Harper, female    DOB: [birth year]     MRN: [id number]   Brief patient profile:  37 yowm quit smoking 2011 with mild sob resolved and fine until thryroid surgery same year then gradual worse breathing since assoc with wt gain from baseline of 140 up to 180's and referred to pulmonary clinic 10/28/2015 by Dr Megan Harper p rec symbicort/alb trial  but never  Started it.     History of Present Illness  10/28/2015 1st [facility] office visit/ Megan Harper   Chief Complaint  Patient presents with  . Pulmonary Consult    Referred by Dr. Kelton Harper.  Pt c/o SOB since 2010, worse for the past year. She states she was dxed with COPD. She gets SOB with just talking sometimes. She also c/o occ '"catch" in her chest and non prod cough.   in the last 7 y  is aware of a problem daily with variable pattern overall progressive sob but not continous , sometimes difficulty across the room / unaware while asleep onset / severity related to reflux severity per pt  No correlation with cough but she does some have problems cough  with laughing causing cough sometimes to point of choking  Nose always stopped up for years does not correlate with symptoms of sob or cough rec GERD diet  Pantoprazole (protonix) 40 mg   Take  30-60 min before first meal of the day and Pepcid (famotidine)  20 mg one @  bedtime until return to office - this is the best way to tell whether stomach acid is contributing to your problem.      12/12/2015  Extended summary  f/u ov/Megan Harper re: unexplained sob/ cough only rx = omeprazole mid day  Chief Complaint  Patient presents with  . Follow-up    Breathing is unchanged. She stopped the protonix due to itching.   sleeps on 2 pillows / water bed Migrating cp/ does not occur supine but happens if tries to sit or sometimes if bends over x years Overt hb symptoms and throat discomfort ever since taking protonix , itching resolved p 24 h off  No obvious day to  day or daytime variability or assoc excess/ purulent sputum or mucus plugs or hemoptysis or   chest tightness, subjective wheeze or overt sinus symptoms. No unusual exp hx or h/o childhood pna/ asthma or knowledge of premature birth.  Sleeping ok without nocturnal  or early am exacerbation  of respiratory  c/o's or need for noct saba. Also denies any obvious fluctuation of symptoms with weather or environmental changes or other aggravating or alleviating factors except as outlined above   Current Medications, Allergies, Complete Past Medical History, Past Surgical History, Family History, and Social History were reviewed in [employer] record.  ROS  The following are not active complaints unless bolded sore throat, dysphagia, dental problems, itching, sneezing,  nasal congestion or excess/ purulent secretions, ear ache,   fever, chills, sweats, unintended wt loss, classically pleuritic or exertional cp,  orthopnea pnd or leg swelling, presyncope, palpitations, abdominal pain, anorexia, nausea, vomiting, diarrhea  or change in bowel or bladder habits, change in stools or urine, dysuria,hematuria,  rash, arthralgias, visual complaints, headache, numbness, weakness or ataxia or problems with walking or coordination,  change in mood/affect or memory.            Objective:   Physical Exam  amb wf nad  With occ violent throat clearing -  no candy handy     12/12/2015       183   10/28/15 181 lb 12.8 oz (82.464 kg)  09/27/14 168 lb (76.204 kg)  09/13/14 168 lb (76.204 kg)    Vital signs reviewed     HEENT: nl dentition, turbinates, and oropharynx. Nl external ear canals without cough reflex   NECK :  without JVD/Nodes/TM/ nl carotid upstrokes bilaterally   LUNGS: no acc muscle use,  Nl contour chest which is clear to A and P bilaterally without cough on insp or exp maneuvers   CV:  RRR  no s3 or murmur or increase in P2, no edema   ABD:  Obese / soft and nontender  with nl inspiratory excursion in the supine position. No bruits or organomegaly, bowel sounds nl  MS:  Nl gait/ ext warm without deformities, calf tenderness, cyanosis or clubbing No obvious joint restrictions   SKIN: warm and dry without lesions    NEURO:  alert, approp, nl sensorium with  no motor deficits     CXR PA and Lateral:   10/28/2015 :    I personally reviewed images and agree with radiology impression as follows:    Lung volumes are within normal limits. Both lungs are clear without airspace disease or pulmonary edema. Heart and mediastinum are within normal limits. Trachea is midline. Mild degenerative changes in thoracic spine. No pleural effusions.               Assessment & Plan:

## 2015-12-12 NOTE — Patient Instructions (Addendum)
Try omeprazole 20 x 2 before x 30 min before breakfast daily and Pepcid 20 mg at bedtime for at least a month trial  GERD (REFLUX)  is an extremely common cause of respiratory symptoms just like yours , many times with no obvious heartburn at all.    It can be treated with medication, but also with lifestyle changes including elevation of the head of your bed (ideally with 6 inch  bed blocks),  Smoking cessation, avoidance of late meals, excessive alcohol, and avoid fatty foods, chocolate, peppermint, colas, red wine, and acidic juices such as orange juice.  NO MINT OR MENTHOL PRODUCTS SO NO COUGH DROPS  USE SUGARLESS CANDY INSTEAD (Jolley ranchers or Stover's or Life Savers) or even ice chips will also do - the key is to swallow to prevent all throat clearing. NO OIL BASED VITAMINS - use powdered substitutes.   I strongly recommend you have a cpst - ok for Dr Nadyne Coombes to schedule-  (take the acid suppression the morning of test)  to sort out why you have symptoms related to exertion but if this is negative this strongly would  indicate that your problem lies in GI and you should focus on this per Dr Henrene Pastor

## 2015-12-13 NOTE — Assessment & Plan Note (Signed)
PFT's  07/19/14   FEV1 1.70 (57 % ) ratio 71  p 10 % improvement from saba with DLCO  39 % corrects to 102% for alv vol ERV 40%  -  10/28/2015  Walked RA x 3 laps @ 185 ft each stopped due to  End of study, fast  pace, min sob no desat    So far obesity/deconditioning are the only findings with pfts c/w restrictive effects of obesity but the next logical step is cpst with spirometry before and after

## 2015-12-13 NOTE — Assessment & Plan Note (Addendum)
Allergy profile 10/28/2015 >  Eos 0.1 /  IgE  6 neg RAST   [place] violent cough typical of    Upper airway cough syndrome, so named because it's frequently impossible to sort out how much is  CR/sinusitis with freq throat clearing (which can be related to primary GERD)   vs  causing  secondary (" extra esophageal")  GERD from wide swings in gastric pressure that occur with throat clearing, often  promoting self use of mint and menthol lozenges that reduce the lower esophageal sphincter tone and exacerbate the problem further in a cyclical fashion.   These are the same pts (now being labeled as having "irritable larynx syndrome" by some cough centers) who not infrequently have a history of having failed to tolerate ace inhibitors,  dry powder inhalers or biphosphonates or report having atypical reflux symptoms that don't respond to standard doses of PPI , and are easily confused as having aecopd or asthma flares by even experienced allergists/ pulmonologists.   For now needs to resume max acid suppression x 24 h with diet/ refuses to elevate HOB as sleeps on water bed/ f/u with GI once CPST/ cards w/u is complete  I had an extended final summary discussion with the patient reviewing all relevant studies completed to date and  lasting 25  minutes of a 40 minute visit   Reviewed her clinical course to date including an apparent reaction to protononix (but not omeprazole which she is now taking at the wrong time of day) with itching on rechallenging with protonix but not with omeprazole and her migratory chest and abd pains that are typical of IBS which I have seen aggravated by PPI in the past and may need additional GI input once the cpst is complete  Each maintenance medication was reviewed in detail including most importantly the difference between maintenance and as needed and under what circumstances the prns are to be used.  Please see instructions for details which were reviewed in writing and the  patient given a copy.

## 2015-12-14 ENCOUNTER — Institutional Professional Consult (permissible substitution): Payer: 59 | Admitting: Pulmonary Disease

## 2016-01-02 ENCOUNTER — Ambulatory Visit (HOSPITAL_COMMUNITY): Payer: 59 | Attending: Cardiology

## 2016-01-02 DIAGNOSIS — R0602 Shortness of breath: Secondary | ICD-10-CM | POA: Insufficient documentation

## 2016-01-31 ENCOUNTER — Telehealth: Payer: Self-pay | Admitting: Internal Medicine

## 2016-01-31 NOTE — Telephone Encounter (Signed)
lmtcb x1 for pt. 

## 2016-02-01 NOTE — Telephone Encounter (Signed)
LMTCB

## 2016-02-03 NOTE — Telephone Encounter (Signed)
Fine with me

## 2016-02-03 NOTE — Telephone Encounter (Signed)
Spoke with pt.  Pt would like to have a 2nd opinion with another MD in our office.  She would like this with Dr. Vaughan Browner but first has to check to see if PM is in network with her insurance.  Pt is aware of office policy.  She is aware we will call her back after sending msg to MW for his approval to see if PM is in network.  She verbalized understanding.  MW, are you ok with this?

## 2016-02-03 NOTE — Telephone Encounter (Signed)
lmtcb x1 for pt. 

## 2016-02-06 NOTE — Telephone Encounter (Signed)
lmtcb x2 for pt. 

## 2016-02-07 NOTE — Telephone Encounter (Signed)
OK with me. Please book for the next available appt

## 2016-02-07 NOTE — Telephone Encounter (Signed)
lmtcb

## 2016-02-07 NOTE — Telephone Encounter (Signed)
Spoke with pt and she would like to see PM. Pt has had CST and PFT done. Pt states that her cardiologist advised her that her lung condition was putting stress on her heart. She would like to know what PM recommends as follow up so that she can make appt.   PM please advise if ok to switch from MW to you and as to when pt needs to be seen for follow up. Thanks!

## 2016-02-08 NOTE — Telephone Encounter (Signed)
lmtcb X1 for pt. Pt just needs to be scheduled for next available 30-minute appt with PM.

## 2016-02-08 NOTE — Telephone Encounter (Signed)
lmtcb

## 2016-02-08 NOTE — Telephone Encounter (Signed)
Pt returning call.Megan Harper ° °

## 2016-02-10 NOTE — Telephone Encounter (Signed)
Spoke with pt. She has already been scheduled to see PM on 03/15/2016. Nothing further was needed.

## 2016-02-10 NOTE — Telephone Encounter (Signed)
Pt calling back [phone]

## 2016-02-10 NOTE — Telephone Encounter (Signed)
lmtcb x2 for pt. 

## 2016-03-15 ENCOUNTER — Ambulatory Visit (INDEPENDENT_AMBULATORY_CARE_PROVIDER_SITE_OTHER): Payer: 59 | Admitting: Pulmonary Disease

## 2016-03-15 ENCOUNTER — Encounter: Payer: Self-pay | Admitting: Pulmonary Disease

## 2016-03-15 VITALS — BP 124/86 | HR 62 | Ht 66.0 in | Wt 183.0 lb

## 2016-03-15 DIAGNOSIS — R911 Solitary pulmonary nodule: Secondary | ICD-10-CM | POA: Diagnosis not present

## 2016-03-15 NOTE — Progress Notes (Addendum)
Subjective:    Patient ID: Megan Harper, female    DOB: 09-Mar-1961, 55 y.o.   MRN: [id number]  HPI Consult for evaluation of dyspnea  Mrs. Cutcher is a 55 year old with past medical history of heavy smoking, hypothyroidism. Complains of dyspnea on exertion since 2010. Symptoms are worsening over the past year. She cannot climb more than a flight of stairs. Her symptoms associated with occasional cough, nonproductive in nature and wheezing. She was given Symbicort several years ago but did not use it. She just on albuterol inhaler which she does not use often. She has stuffy nose but no seasonal allergies, postnasal drip. She has significant issues with acid reflux and is on Nexium. She had a normal EGD in 2016.  She's had workup as noted below which included a cardiac pulmonary exercise test. Evaluated by her cardiologist Dr. Einar Gip with echo suggestive of elevated right pressures (see report below). She has thyroid surgery as few years ago and noted weight gain since then. She is trying to lose weight with exercise program and diet.   DATA: PFTs 07/19/14 FVC 2.31 (61%) FEV1 1.55 (52%) F/F 67 TLC 89% RV/TLC 137 DLCO 39% Moderate obstructive disease with slight bronchial dilator response Severe diffusion defect which corrects partially for alveolar volume.   Cardiac CPET 01/02/16 Exercise testing with gas exchange demonstrates normal functional capacity when compared to matched sedentary Norms.There is no cardiac limitation noted. At peak exercise, patient appears limited due to her body habitus with related restrictive lung physiology and mild obstruction. Would recommend continued follow-up with Pulmonary as well as weight loss.   Echocardiogram 12/27/15 Normal LV cavity size and function, normal diastolic filling pattern EF 61% Trace MR, mild TR. No evidence of pulmonary hypertension. Trace PR. IVC is dilated with respiratory variation. Suggest elevated right heart  pressure.  Imaging CXR 10/28/15 No active disease  CT scan 03/22/10 Small stable bilateral lung nodules, likely calcified granuloma. Bilateral upper lobe emphysematous changes.  Social History: 54-pack-year smoking history. She quit in 2011. No alcohol, drug use.  Family History: Lung cancer-father Uterine cancer-mother   Past Medical History  Diagnosis Date  . GERD (gastroesophageal reflux disease)   . Endometriosis   . Fibrocystic breast disease   . Cervical dysplasia   . Thyroid nodule   . Anxiety   . COPD (chronic obstructive pulmonary disease) ([place])     Current outpatient prescriptions:  .  albuterol (PROVENTIL HFA;VENTOLIN HFA) 108 (90 Base) MCG/ACT inhaler, Inhale 2 puffs into the lungs every 6 (six) hours as needed for wheezing or shortness of breath., Disp: 1 Inhaler, Rfl: 6 .  levothyroxine (SYNTHROID, LEVOTHROID) 50 MCG tablet, Take 50 mcg by mouth daily before breakfast., Disp: , Rfl:  .  omeprazole (PRILOSEC) 20 MG capsule, Take 20 mg by mouth daily., Disp: , Rfl:   Review of Systems Dyspnea on exertion, cough, sputum production, wheezing. No hemoptysis Mild epigastric pain, no nausea, vomiting, diarrhea, constipation. Chest pain, palpitations. No fevers, chills, malaise, loss of weight, loss of appetite. All other review of systems negative.    Objective:   Physical Exam Blood pressure 124/86, pulse 62, height 5\' 6"  (1.676 m), weight 183 lb (83.008 kg), SpO2 98 %. Gen: No apparent distress Neuro: No gross focal deficits. HEENT: No JVD, lymphadenopathy, thyromegaly. RS: Clear, No wheeze or crackles CVS: S1-S2 heard, no murmurs rubs gallops. Abdomen: Soft, positive bowel sounds. Musculoskeletal: No edema.    Assessment & Plan:  #1 Dyspnea She has moderate COPD from smoking.  CT scan in past showed emphysema. The PFTs were reviewed which show moderate obstructive defect with improvement in flow rates post albuterol, although this did not meet ATS criteria  for a bronchodilator response. She also has significant air trapping as shown by the RV/TLC ratio. I believe she would benefit from addition of an inhaler and will try her on symbicort.   She has disproportionate reduction in her diffusion capacity suggestive of pulmonary vascular process but echo did not show any overt pulmonary hypertension. We will continue to monitor.  CPET also shows exercise limitation from obesity and deconditioning. I encouraged weight loss and exercise.  #2 Pulmonary nodules. This is likely benign, suggestive of calcified granulomas. However given her smoking history and since she is anxious about malignancy I'll go ahead with a CT scan without contrast. If this is normal she will get enrolled in the regular low-dose cancer screening program starting next year.  Plan: - Start symbicort - CT chest without contrast - Weight loss and exercise.  Marshell Garfinkel MD [facility] Pager [phone] If no answer or after 3pm call: [phone] 03/15/2016, 11:34 AM

## 2016-03-15 NOTE — Patient Instructions (Addendum)
Start Symbicort 160, 2 puffs twice daily. We will schedule you for a chest CT. Return in 2 months.

## 2016-03-22 ENCOUNTER — Ambulatory Visit (INDEPENDENT_AMBULATORY_CARE_PROVIDER_SITE_OTHER)
Admission: RE | Admit: 2016-03-22 | Discharge: 2016-03-22 | Disposition: A | Discharge status: Home / Self Care | Payer: 59 | Source: Ambulatory Visit | Attending: Pulmonary Disease | Admitting: Pulmonary Disease

## 2016-03-22 DIAGNOSIS — R911 Solitary pulmonary nodule: Secondary | ICD-10-CM | POA: Diagnosis not present

## 2016-04-02 ENCOUNTER — Telehealth: Payer: Self-pay | Admitting: Pulmonary Disease

## 2016-04-02 NOTE — Telephone Encounter (Signed)
Can we try advair 250/50 or dulera 200/10 bid. Give her a sample if we have any. Thanks.

## 2016-04-02 NOTE — Telephone Encounter (Signed)
Called spoke with patient and discussed her CT results / recs as stated by PM Pt did mention that her Symbicort Rx'd at last ov caused throat irritation, "pressure on her voicebox" and dryness to the point where she felt like water was irritating it further.  Pt decreased the dose to 1 puff daily with no relief.  She stopped this medication on 8.3.17 and the irritation and dryness has since resolved.  Pt is requesting an alternative.  Dr Vaughan Browner please advise, thank you.

## 2016-04-02 NOTE — Progress Notes (Signed)
Called spoke with patient, advised of CT results / recs as stated by PM.  Pt verbalized her understanding and denied any questions.  Pt would like to hold off on the referral to the Lung Nodule Program at this time and will discuss it further at her 9.20.17 ov w/ PM.

## 2016-04-03 NOTE — Telephone Encounter (Signed)
Called spoke with patient and advised of PM's recommendations as stated below Pt voiced her understanding but would like to contact her insurance company to check coverage on these two medications and call me back.  Did advise pt that if for some reason her insurance will not cover either of those meds, to please ask them what they will cover.  Pt voiced her understanding.  Will route back to myself for follow up

## 2016-04-17 NOTE — Telephone Encounter (Signed)
Called spoke with pt. She reports she is currently having ear problems that is affecting her throat/sinus's. She is getting this checked out first bc she is now thinking it is not the inhaler causing her problems. She reports after she see's ENT on Friday, she will give [place] a call. Will await call back.

## 2016-05-08 ENCOUNTER — Telehealth: Payer: Self-pay | Admitting: Pulmonary Disease

## 2016-05-08 NOTE — Telephone Encounter (Signed)
Patient states she started Symbicort, but it irritates her throat.  She stopped taking it and discuss switching to Advair.  Patient said that she spoke with Mindy and told her that she was going to try Symbicort again because she had an ear infection and decided to try.  She cancelled appointment to give herself time with Symbicort, and reschedule appointment for recheck later. Patient calling to go ahead and schedule appointment with Dr. Vaughan Browner in 1 month.   Scheduled to see Dr. Vaughan Browner, 10/16 at 21.  Nothing further needed.

## 2016-05-11 ENCOUNTER — Encounter: Payer: Self-pay | Admitting: Pulmonary Disease

## 2016-05-16 ENCOUNTER — Ambulatory Visit: Payer: 59 | Admitting: Pulmonary Disease

## 2016-05-30 ENCOUNTER — Other Ambulatory Visit: Payer: Self-pay | Admitting: Family Medicine

## 2016-05-30 ENCOUNTER — Telehealth: Payer: Self-pay | Admitting: Pulmonary Disease

## 2016-05-30 DIAGNOSIS — R198 Other specified symptoms and signs involving the digestive system and abdomen: Secondary | ICD-10-CM

## 2016-05-30 MED ORDER — MOMETASONE FURO-FORMOTEROL FUM 200-5 MCG/ACT IN AERO: 2.0000 | INHALATION_SPRAY | Freq: Two times a day (BID) | RESPIRATORY_TRACT | 0 refills | Status: DC

## 2016-05-30 NOTE — Telephone Encounter (Signed)
Spoke with pt. Pt would like to try Dulera 200. Sample given to pt and advised to take 2 puffs twice daily. Pt states she will call back if this works well for her and needs an rx. Nothing further needed at this time.

## 2016-06-11 ENCOUNTER — Ambulatory Visit: Payer: 59 | Admitting: Pulmonary Disease

## 2016-06-13 ENCOUNTER — Ambulatory Visit (INDEPENDENT_AMBULATORY_CARE_PROVIDER_SITE_OTHER): Payer: 59 | Admitting: Endocrinology

## 2016-06-13 ENCOUNTER — Encounter: Payer: Self-pay | Admitting: Endocrinology

## 2016-06-13 VITALS — BP 124/76 | HR 64 | Temp 97.7°F | Resp 14 | Ht 66.0 in | Wt 186.8 lb

## 2016-06-13 DIAGNOSIS — Z8639 Personal history of other endocrine, nutritional and metabolic disease: Secondary | ICD-10-CM

## 2016-06-13 DIAGNOSIS — L659 Nonscarring hair loss, unspecified: Secondary | ICD-10-CM | POA: Diagnosis not present

## 2016-06-13 DIAGNOSIS — R5383 Other fatigue: Secondary | ICD-10-CM | POA: Diagnosis not present

## 2016-06-13 DIAGNOSIS — M797 Fibromyalgia: Secondary | ICD-10-CM

## 2016-06-13 LAB — T4, FREE: Free T4: 1.13 ng/dL (ref 0.60–1.60)

## 2016-06-13 LAB — T3, FREE: T3 FREE: 3.6 pg/mL (ref 2.3–4.2)

## 2016-06-13 NOTE — Progress Notes (Signed)
Patient ID: Megan Harper, female   DOB: [birth year], 55 y.o.   MRN: [id number]             Reason for Appointment: ?  Hypothyroidism, new visit    History of Present Illness:    She had a thyroid nodule discovered on a routine exam in 2011 Since the cytology was indicating follicular cells she had a right thyroid lobectomy done which showed benign hyperplastic nodule with Hurthle cells She was not told to have hypothyroidism previously She was started on 100 g of levothyroxine by the surgeon potentially to prevent any nodule formation on the contralateral side  However the patient thinks that sometime after her surgery she started having problems with weight gain and fatigue. She progressively gained about 40 pounds over time.  However also she said that she stopped drinking alcohol mostly in the form of beer Also for several years she has continued to feel tired and has daytime sleepiness also She does not have any significant cold intolerance but she thinks that she feels cold inside more significantly at times However when she is in the sun she tends to feel excessively hot. Over the last day she has had some hair shedding and now staying some thinning of the hair in the front of the scalp She thinks she has had some swelling of her eyes and feet.          Patient's weight history is as follows:   Wt Readings from Last 3 Encounters:  06/13/16 186 lb 12.8 oz (84.7 kg)  03/15/16 183 lb (83 kg)  12/12/15 183 lb (83 kg)    Thyroid function results have been Reviewed from PCP office Her TSH has been consistently in the normal range ranging from 1.6 up to 2.4 since 2015 and most recent TSH was 2.2 done in October 2017 Total T4 was normal in 2014  Patient has requested a consultation since she thinks that she has problems with her thyroid that needs to be evaluated  No results found for: FREET4, TSH   Past Medical History:  Diagnosis Date  . Anxiety   . Cervical dysplasia     . COPD (chronic obstructive pulmonary disease) ([place])   . Endometriosis   . Fibrocystic breast disease   . GERD (gastroesophageal reflux disease)   . Thyroid nodule     Past Surgical History:  Procedure Laterality Date  . laproscopy for endometriosis    . THYROID LOBECTOMY Right 03/08/2010  . TUBAL LIGATION  01/23/1986    Family History  Problem Relation Age of Onset  . Heart disease Paternal Grandmother   . Hypertension Maternal Grandfather   . Uterine cancer Mother   . Lung cancer Father     smoked  . Colon cancer Maternal Grandfather   . Kidney cancer Maternal Grandfather   . Kidney cancer Maternal Uncle   . Diabetes Paternal Grandmother   . Heart disease Maternal Grandmother   . Heart disease Maternal Uncle   . Hypertension Maternal Grandmother     Social History:  reports that she quit smoking about 6 years ago. Her smoking use included Cigarettes. She has a 77.50 pack-year smoking history. She has never used smokeless tobacco. She reports that she does not drink alcohol or use drugs.  Allergies:  Allergies  Allergen Reactions  . Ceclor [Cefaclor]     Flu like symptoms  . Pantoprazole Itching      Medication List       Accurate as of  06/13/16 11:41 AM. Always use your most recent med list.          albuterol 108 (90 Base) MCG/ACT inhaler Commonly known as:  PROVENTIL HFA;VENTOLIN HFA Inhale 2 puffs into the lungs every 6 (six) hours as needed for wheezing or shortness of breath.   levothyroxine 50 MCG tablet Commonly known as:  SYNTHROID, LEVOTHROID Take 50 mcg by mouth daily before breakfast.   mometasone-formoterol 200-5 MCG/ACT Aero Commonly known as:  DULERA Inhale 2 puffs into the lungs 2 (two) times daily.   omeprazole 20 MG capsule Commonly known as:  PRILOSEC Take 20 mg by mouth daily.       Review of Systems:  Review of Systems  Respiratory: Positive for daytime sleepiness and shortness of breath.        She has been told to have  COPD, currently not on any inhalers  Cardiovascular: Positive for palpitations and leg swelling.  Gastrointestinal: Positive for constipation and abdominal pain.       Periodically has abdominal discomfort and tenderness, getting evaluation done  Endocrine: Positive for cold intolerance and heat intolerance.       In 2010 Menopause, no hot flashes but is sensitive to sun exposure with more sweating    Musculoskeletal: Positive for joint pain and muscle aches.  Neurological:       Has memory difficulties  Psychiatric/Behavioral: Positive for insomnia.Negative for depressed mood.       Usually has middle insomnia          Examination:    BP 124/76   Pulse 64   Temp 97.7 F (36.5 C)   Resp 14   Ht 5\' 6"  (1.676 m)   Wt 186 lb 12.8 oz (84.7 kg)   SpO2 98%   BMI 30.15 kg/m   GENERAL:  Average build.  Mild generalized obesity present.  No pallor, clubbing, lymphadenopathy  Skin:  no rash or pigmentation.  EYES:  No prominence of the eyes ; does have swelling of the eyelids  ENT: Oral mucosa and tongue normal.  THYROID:  Not palpable.  HEART:  Normal  S1 and S2; no murmur or click.  CHEST:    Lungs: Vescicular breath sounds heard equally.  No crepitations/ wheeze.  ABDOMEN:  No distention.  Liver and spleen not palpable.  No other mass.  Had mild diffuse tenderness on exam more in the upper and right abdomen  NEUROLOGICAL: Reflexes are bilaterally normal at biceps and ankles.  Extremities: She has 1+ right and 2+ ankle edema  JOINTS:  Normal.   Assessment:  History of benign right-sided thyroid nodule Patient has been on levothyroxine since surgery for purposes of thyroid suppression Does not have documented HYPOTHYROIDISM.  She continues to have multiple somatic symptoms of fatigue, difficulty losing weight and various aches and pains which are unlikely to be related to hypothyroidism Although she does have some puffiness of her face and ankle edema she does not  have all the signs of hypothyroidism on exam  Most likely her symptoms are related to chronic insomnia and fibromyalgia  Dependent pedal edema: May be related to venous insufficiency  PLAN:   Check free T4 and free T3, will need to rule out secondary  hypothyroidism Check thyroid antibodies to determine if she may have Hashimoto thyroiditis in the remaining lobe  Discussed to the patient that she does not need to take levothyroxine long-term for thyroid suppression and may theoretically stop this Will also check DHEAS because of her hair loss  She will need to discuss or other symptoms and potentially management of fibromyalgia with PCP  Follow-up to be determined after labs are available   [facility] 06/13/2016, 11:41 AM

## 2016-06-14 ENCOUNTER — Telehealth: Payer: Self-pay | Admitting: Pulmonary Disease

## 2016-06-14 LAB — DHEA-SULFATE: DHEA SO4: 155.3 ug/dL (ref 41.2–243.7)

## 2016-06-14 LAB — THYROID PEROXIDASE ANTIBODY

## 2016-06-14 MED ORDER — BECLOMETHASONE DIPROPIONATE 80 MCG/ACT IN AERS: 2.0000 | INHALATION_SPRAY | Freq: Two times a day (BID) | RESPIRATORY_TRACT | 6 refills | Status: DC

## 2016-06-14 NOTE — Telephone Encounter (Signed)
Patient states that the Qvar is covered at the lower copay of $15. -pr

## 2016-06-14 NOTE — Telephone Encounter (Signed)
Pt aware of rec's per [facility] Rx for QVAR 80mg  called into pharmacy. Will send to Dr Vaughan Browner as Juluis Rainier.  Nothing further needed.

## 2016-06-14 NOTE — Telephone Encounter (Signed)
Pt states that she spoke with insurance company and was informed that the Megan Harper is not covered by insurance and she will have to try/fail alternatives before they would cover it. Alternatives that are covered are Symbicort, Advair, QVAR and Albuterol.  Pt was told that QVAR would have a cheaper co-pay where as the other alternatives are $85/mo. Symbicort caused throat irritation. Pt is completely out of medication.  Pt needing to know what she can do in the meantime while Dr Vaughan Browner is not available.   Please advise Dr Annamaria Boots if you are able to recommend anything. Thanks.     Medication List       Accurate as of 06/14/16  2:56 PM. Always use your most recent med list.          albuterol 108 (90 Base) MCG/ACT inhaler Commonly known as:  PROVENTIL HFA;VENTOLIN HFA Inhale 2 puffs into the lungs every 6 (six) hours as needed for wheezing or shortness of breath.   levothyroxine 50 MCG tablet Commonly known as:  SYNTHROID, LEVOTHROID Take 50 mcg by mouth daily before breakfast.   omeprazole 20 MG capsule Commonly known as:  PRILOSEC Take 20 mg by mouth daily.      Allergies  Allergen Reactions  . Ceclor [Cefaclor]     Flu like symptoms  . Pantoprazole Itching

## 2016-06-14 NOTE — Telephone Encounter (Signed)
Suggest try Qvar 80    # 1,  Inhale 2 puffs then rinse mouth, twice daily maintenance, instead of Dulera                     At the same time, take 2 puffs of albuterol rescue inhaler twice daily  Try this for a few weeks, and discuss with Dr Vaughan Browner

## 2016-06-14 NOTE — Progress Notes (Signed)
Please let patient know that the rest of the thyroid hormone levels and test   Of autoimmune disease are all normal as also the adrenal gland test.  Recommend she Talk to her PCP about  Finding a chronic fatigue syndrome specialist  fax to PCP

## 2016-06-15 ENCOUNTER — Telehealth: Payer: Self-pay | Admitting: Endocrinology

## 2016-06-15 NOTE — Telephone Encounter (Signed)
Please see below.

## 2016-06-15 NOTE — Telephone Encounter (Signed)
Pt wants to know if there is another drug besides Synthroid that she can maybe switch to.

## 2016-06-15 NOTE — Progress Notes (Signed)
Please have the patient try taking half tablet daily until mid-November and then come back with labs in mid-December

## 2016-06-16 NOTE — Telephone Encounter (Signed)
No other drug besides Synthroid that would be indicated. In any case I don't think she needs to be on it

## 2016-06-18 ENCOUNTER — Ambulatory Visit
Admission: RE | Admit: 2016-06-18 | Discharge: 2016-06-18 | Disposition: A | Discharge status: Home / Self Care | Payer: 59 | Source: Ambulatory Visit | Attending: Family Medicine | Admitting: Family Medicine

## 2016-06-18 DIAGNOSIS — R198 Other specified symptoms and signs involving the digestive system and abdomen: Secondary | ICD-10-CM

## 2016-06-18 NOTE — Telephone Encounter (Signed)
Noted, patient is aware. 

## 2016-07-05 ENCOUNTER — Ambulatory Visit: Payer: 59 | Admitting: Internal Medicine

## 2016-07-09 ENCOUNTER — Ambulatory Visit: Payer: 59 | Admitting: Pulmonary Disease

## 2016-08-06 ENCOUNTER — Other Ambulatory Visit: Payer: Self-pay | Admitting: Endocrinology

## 2016-08-06 ENCOUNTER — Other Ambulatory Visit (INDEPENDENT_AMBULATORY_CARE_PROVIDER_SITE_OTHER): Payer: 59

## 2016-08-06 DIAGNOSIS — R5383 Other fatigue: Secondary | ICD-10-CM | POA: Diagnosis not present

## 2016-08-06 LAB — TSH: TSH: 3.94 u[IU]/mL (ref 0.35–4.50)

## 2016-08-06 LAB — T3, FREE: T3, Free: 3.5 pg/mL (ref 2.3–4.2)

## 2016-08-06 LAB — T4, FREE: Free T4: 0.97 ng/dL (ref 0.60–1.60)

## 2016-08-10 ENCOUNTER — Ambulatory Visit (INDEPENDENT_AMBULATORY_CARE_PROVIDER_SITE_OTHER): Payer: 59 | Admitting: Endocrinology

## 2016-08-10 ENCOUNTER — Encounter: Payer: Self-pay | Admitting: Endocrinology

## 2016-08-10 VITALS — BP 120/70 | HR 74 | Ht 66.0 in | Wt 185.0 lb

## 2016-08-10 DIAGNOSIS — R5383 Other fatigue: Secondary | ICD-10-CM

## 2016-08-10 DIAGNOSIS — Z8639 Personal history of other endocrine, nutritional and metabolic disease: Secondary | ICD-10-CM | POA: Diagnosis not present

## 2016-08-10 NOTE — Progress Notes (Signed)
Patient ID: Megan Harper, female   DOB: [birth year], 55 y.o.   MRN: [id number]             Reason for Appointment:   Follow-up of thyroid    History of Present Illness:   Prior history: She had a thyroid nodule discovered on a routine exam in 2011 Since the cytology was indicating follicular cells she had a right thyroid lobectomy done which showed benign hyperplastic nodule with Hurthle cells She was not told to have hypothyroidism previously She was started on 100 g of levothyroxine by the surgeon potentially to prevent any nodule formation on the contralateral side Apparently after her surgery she started having problems with weight gain and fatigue. She progressively gained about 40 pounds over time.  However also she said that she stopped drinking alcohol mostly in the form of beer Also for several years she has continued to feel tired and has daytime sleepiness also She does not have any significant cold intolerance but she thinks that she feels cold inside more significantly at times At times she would feel excessively hot. Also has had some hair loss, occasional palpitations and some swelling of her eyes and feet.   Thyroid function results have been Reviewed from PCP office Her TSH has been consistently in the normal range ranging from 1.6 up to 2.4 since 2015 and most recent TSH was 2.2 done in October 2017 Total T4 was normal in 2014        RECENT history:  Since at initial consultation she was not felt to have symptoms from thyroid disease and her labs were normal including free T3 she was told to taper off her levothyroxine and she is now here for follow-up.  She does not feel very different from before and has no increased fatigue She occasionally has had palpitations  Patient's weight history is as follows:   Wt Readings from Last 3 Encounters:  08/10/16 185 lb (83.9 kg)  06/13/16 186 lb 12.8 oz (84.7 kg)  03/15/16 183 lb (83 kg)   Recent labs:   Lab  Results  Component Value Date   FREET4 0.97 08/06/2016   FREET4 1.13 06/13/2016   TSH 3.94 08/06/2016     Past Medical History:  Diagnosis Date  . Anxiety   . Cervical dysplasia   . COPD (chronic obstructive pulmonary disease) ([place])   . Endometriosis   . Fibrocystic breast disease   . GERD (gastroesophageal reflux disease)   . Thyroid nodule     Past Surgical History:  Procedure Laterality Date  . laproscopy for endometriosis    . THYROID LOBECTOMY Right 03/08/2010  . TUBAL LIGATION  01/23/1986    Family History  Problem Relation Age of Onset  . Heart disease Paternal Grandmother   . Diabetes Paternal Grandmother   . Hypertension Maternal Grandfather   . Colon cancer Maternal Grandfather   . Kidney cancer Maternal Grandfather   . Uterine cancer Mother   . Lung cancer Father     smoked  . Kidney cancer Maternal Uncle   . Heart disease Maternal Grandmother   . Hypertension Maternal Grandmother   . Thyroid disease Maternal Grandmother   . Heart disease Maternal Uncle   . Thyroid disease Daughter     Social History:  reports that she quit smoking about 6 years ago. Her smoking use included Cigarettes. She has a 77.50 pack-year smoking history. She has never used smokeless tobacco. She reports that she does not drink alcohol or use  drugs.  Allergies:  Allergies  Allergen Reactions  . Ceclor [Cefaclor]     Flu like symptoms  . Pantoprazole Itching    Allergies as of 08/10/2016      Reactions   Ceclor [cefaclor]    Flu like symptoms   Pantoprazole Itching      Medication List       Accurate as of 08/10/16  4:34 PM. Always use your most recent med list.          albuterol 108 (90 Base) MCG/ACT inhaler Commonly known as:  PROVENTIL HFA;VENTOLIN HFA Inhale 2 puffs into the lungs every 6 (six) hours as needed for wheezing or shortness of breath.   beclomethasone 80 MCG/ACT inhaler Commonly known as:  QVAR Inhale 2 puffs into the lungs 2 (two) times  daily.   levothyroxine 50 MCG tablet Commonly known as:  SYNTHROID, LEVOTHROID Take 50 mcg by mouth daily before breakfast.   omeprazole 20 MG capsule Commonly known as:  PRILOSEC Take 20 mg by mouth daily.        Review of Systems        Examination:    BP 120/70   Pulse 74   Ht 5\' 6"  (1.676 m)   Wt 185 lb (83.9 kg)   SpO2 95%   BMI 29.86 kg/m      Assessment:  History of benign right-sided thyroid nodule Patient had been on levothyroxine since surgery for purposes of thyroid suppression but appears to be euthyroid now with no thyroid supplementation recently She is also subjectively not feeling any worse TSH is upper normal but free T4 and free T3 are quite normal  Again discussed that her multiple somatic symptoms are unrelated to thyroid and will need to discuss with her PCP   PLAN:   Continue to stay off thyroid supplements Follow-up in 3 months to see if her thyroid levels are stable  Shahil Speegle 08/10/2016, 4:34 PM

## 2016-08-12 DIAGNOSIS — Z8639 Personal history of other endocrine, nutritional and metabolic disease: Secondary | ICD-10-CM | POA: Insufficient documentation

## 2016-10-04 ENCOUNTER — Other Ambulatory Visit: Payer: Self-pay | Admitting: Otolaryngology

## 2016-10-04 DIAGNOSIS — J329 Chronic sinusitis, unspecified: Secondary | ICD-10-CM

## 2016-10-09 ENCOUNTER — Ambulatory Visit
Admission: RE | Admit: 2016-10-09 | Discharge: 2016-10-09 | Disposition: A | Discharge status: Home / Self Care | Payer: 59 | Source: Ambulatory Visit | Attending: Otolaryngology | Admitting: Otolaryngology

## 2016-10-09 DIAGNOSIS — J329 Chronic sinusitis, unspecified: Secondary | ICD-10-CM

## 2016-10-29 ENCOUNTER — Other Ambulatory Visit: Payer: Self-pay | Admitting: Obstetrics and Gynecology

## 2016-10-31 LAB — CYTOLOGY - PAP

## 2016-11-08 ENCOUNTER — Other Ambulatory Visit (INDEPENDENT_AMBULATORY_CARE_PROVIDER_SITE_OTHER): Payer: 59

## 2016-11-08 DIAGNOSIS — R5383 Other fatigue: Secondary | ICD-10-CM

## 2016-11-08 LAB — T4, FREE: Free T4: 1.02 ng/dL (ref 0.60–1.60)

## 2016-11-08 LAB — TSH: TSH: 3.31 u[IU]/mL (ref 0.35–4.50)

## 2016-11-16 ENCOUNTER — Encounter: Payer: Self-pay | Admitting: Endocrinology

## 2016-11-16 ENCOUNTER — Ambulatory Visit (INDEPENDENT_AMBULATORY_CARE_PROVIDER_SITE_OTHER): Payer: 59 | Admitting: Endocrinology

## 2016-11-16 VITALS — BP 100/64 | HR 66 | Ht 66.0 in | Wt 183.0 lb

## 2016-11-16 DIAGNOSIS — R5383 Other fatigue: Secondary | ICD-10-CM | POA: Diagnosis not present

## 2016-11-16 DIAGNOSIS — Z8639 Personal history of other endocrine, nutritional and metabolic disease: Secondary | ICD-10-CM

## 2016-11-16 MED ORDER — LEVOTHYROXINE SODIUM 50 MCG PO TABS: 50.0000 ug | ORAL_TABLET | Freq: Every day | ORAL | 0 refills | Status: DC

## 2016-11-16 NOTE — Patient Instructions (Signed)
Restart taking 50 g of levothyroxine in the morning before breakfast If you are not improving with energy level and concentration in 30 days you may stop this  If you are feeling significantly better let Dr know and we will refill the medication

## 2016-11-16 NOTE — Progress Notes (Signed)
Patient ID: Megan Harper, female   DOB: Oct 31, 1960, 56 y.o.   MRN: [id number]             Reason for Appointment:   Follow-up of thyroid    History of Present Illness:   Prior history: She had a thyroid nodule discovered on a routine exam in 2011 Since the cytology was indicating follicular cells she had a right thyroid lobectomy done which showed benign hyperplastic nodule with Hurthle cells She was not told to have hypothyroidism previously She was started on 100 g of levothyroxine by the surgeon potentially to prevent any nodule formation on the contralateral side Apparently after her surgery she started having problems with weight gain and fatigue. She progressively gained about 40 pounds over time.  However also she said that she stopped drinking alcohol mostly in the form of beer Also for several years she has continued to feel tired and has daytime sleepiness also She does not have any significant cold intolerance but she thinks that she feels cold inside more significantly at times At times she would feel excessively hot. Also has had some hair loss, occasional palpitations and some swelling of her eyes and feet.  Her TSH has been previously consistently in the normal range ranging from 1.6 up to 2.4 since 2015 and most recent TSH was 2.2 done in October 2017 Total T4 was normal in 2014        RECENT history:   Since at initial consultation she was not felt to have symptoms from thyroid disease and her labs were normal including free T3 she was told to taper off her levothyroxine Since she reported that she did not feel very different from before when she was taking levothyroxine she was told to continue without supplements She thinks she has had some increased fatigue recently and now she thinks she may have been feeling better with taking the levothyroxine Also seems to have long-term symptom of  difficulty focusing especially at work No recent weight change  Patient's  weight history is as follows:   Wt Readings from Last 3 Encounters:  11/16/16 183 lb (83 kg)  08/10/16 185 lb (83.9 kg)  06/13/16 186 lb 12.8 oz (84.7 kg)   Recent labs:   Lab Results  Component Value Date   FREET4 1.02 11/08/2016   FREET4 0.97 08/06/2016   FREET4 1.13 06/13/2016   TSH 3.31 11/08/2016   TSH 3.94 08/06/2016   Lab Results  Component Value Date   T3FREE 3.5 08/06/2016   T3FREE 3.6 06/13/2016      Past Medical History:  Diagnosis Date  . Anxiety   . Cervical dysplasia   . COPD (chronic obstructive pulmonary disease) ([place])   . Endometriosis   . Fibrocystic breast disease   . GERD (gastroesophageal reflux disease)   . Thyroid nodule     Past Surgical History:  Procedure Laterality Date  . laproscopy for endometriosis    . THYROID LOBECTOMY Right 03/08/2010  . TUBAL LIGATION  01/23/1986    Family History  Problem Relation Age of Onset  . Heart disease Paternal Grandmother   . Diabetes Paternal Grandmother   . Hypertension Maternal Grandfather   . Colon cancer Maternal Grandfather   . Kidney cancer Maternal Grandfather   . Uterine cancer Mother   . Lung cancer Father     smoked  . Kidney cancer Maternal Uncle   . Heart disease Maternal Grandmother   . Hypertension Maternal Grandmother   . Thyroid disease  Maternal Grandmother   . Heart disease Maternal Uncle   . Thyroid disease Daughter     Social History:  reports that she quit smoking about 7 years ago. Her smoking use included Cigarettes. She has a 77.50 pack-year smoking history. She has never used smokeless tobacco. She reports that she does not drink alcohol or use drugs.  Allergies:  Allergies  Allergen Reactions  . Ceclor [Cefaclor]     Flu like symptoms  . Pantoprazole Itching    Allergies as of 11/16/2016      Reactions   Ceclor [cefaclor]    Flu like symptoms   Pantoprazole Itching      Medication List       Accurate as of 11/16/16  4:41 PM. Always use your most recent  med list.          albuterol 108 (90 Base) MCG/ACT inhaler Commonly known as:  PROVENTIL HFA;VENTOLIN HFA Inhale 2 puffs into the lungs every 6 (six) hours as needed for wheezing or shortness of breath.   beclomethasone 80 MCG/ACT inhaler Commonly known as:  QVAR Inhale 2 puffs into the lungs 2 (two) times daily.   levothyroxine 50 MCG tablet Commonly known as:  SYNTHROID, LEVOTHROID Take 50 mcg by mouth daily before breakfast.   omeprazole 20 MG capsule Commonly known as:  PRILOSEC Take 20 mg by mouth daily.        Review of Systems        Examination:    BP 100/64   Pulse 66   Ht 5\' 6"  (1.676 m)   Wt 183 lb (83 kg)   SpO2 96%   BMI 29.54 kg/m   No mass felt in the thyroid but Reflexes at biceps appear normal  Assessment:  History of benign right-sided thyroid nodule Patient had been on levothyroxine since surgery for purposes of thyroid suppression but has been euthyroid without thyroid supplement, previously taking 50 g  Although on her last visit TSH was upper normal it is now 3.31 Also free T4 and free T3 are quite normal  Again discussed that her multiple somatic symptoms are unrelated to thyroid However she thinks she may have felt a little better with taking levothyroxine    PLAN:   She will try taking levothyroxine 50 g to see if she feels any better However discussed that she may need to talk to her PCP regarding possible ADD  [facility] 11/16/2016, 4:41 PM

## 2017-07-23 ENCOUNTER — Ambulatory Visit (INDEPENDENT_AMBULATORY_CARE_PROVIDER_SITE_OTHER): Payer: 59 | Admitting: Physician Assistant

## 2017-07-23 ENCOUNTER — Encounter: Payer: Self-pay | Admitting: Physician Assistant

## 2017-07-23 ENCOUNTER — Other Ambulatory Visit (INDEPENDENT_AMBULATORY_CARE_PROVIDER_SITE_OTHER): Payer: 59

## 2017-07-23 ENCOUNTER — Encounter (INDEPENDENT_AMBULATORY_CARE_PROVIDER_SITE_OTHER): Payer: Self-pay

## 2017-07-23 ENCOUNTER — Ambulatory Visit: Payer: 59 | Admitting: Physician Assistant

## 2017-07-23 VITALS — BP 130/78 | HR 80 | Wt 182.0 lb

## 2017-07-23 DIAGNOSIS — J0101 Acute recurrent maxillary sinusitis: Secondary | ICD-10-CM | POA: Diagnosis not present

## 2017-07-23 DIAGNOSIS — Z86010 Personal history of colonic polyps: Secondary | ICD-10-CM

## 2017-07-23 DIAGNOSIS — K219 Gastro-esophageal reflux disease without esophagitis: Secondary | ICD-10-CM

## 2017-07-23 DIAGNOSIS — R1010 Upper abdominal pain, unspecified: Secondary | ICD-10-CM

## 2017-07-23 DIAGNOSIS — J320 Chronic maxillary sinusitis: Secondary | ICD-10-CM | POA: Diagnosis not present

## 2017-07-23 LAB — CBC WITH DIFFERENTIAL/PLATELET
BASOS PCT: 1.1 % (ref 0.0–3.0)
Basophils Absolute: 0.1 10*3/uL (ref 0.0–0.1)
EOS PCT: 0.4 % (ref 0.0–5.0)
Eosinophils Absolute: 0 10*3/uL (ref 0.0–0.7)
HCT: 41.6 % (ref 36.0–46.0)
HEMOGLOBIN: 14.2 g/dL (ref 12.0–15.0)
LYMPHS ABS: 1.3 10*3/uL (ref 0.7–4.0)
Lymphocytes Relative: 14.9 % (ref 12.0–46.0)
MCHC: 34 g/dL (ref 30.0–36.0)
MCV: 96.9 fl (ref 78.0–100.0)
MONOS PCT: 6.9 % (ref 3.0–12.0)
Monocytes Absolute: 0.6 10*3/uL (ref 0.1–1.0)
NEUTROS PCT: 76.7 % (ref 43.0–77.0)
Neutro Abs: 6.6 10*3/uL (ref 1.4–7.7)
Platelets: 282 10*3/uL (ref 150.0–400.0)
RBC: 4.29 Mil/uL (ref 3.87–5.11)
RDW: 13.1 % (ref 11.5–15.5)
WBC: 8.6 10*3/uL (ref 4.0–10.5)

## 2017-07-23 LAB — BASIC METABOLIC PANEL
BUN: 7 mg/dL (ref 6–23)
CHLORIDE: 101 meq/L (ref 96–112)
CO2: 30 mEq/L (ref 19–32)
Calcium: 9.5 mg/dL (ref 8.4–10.5)
Creatinine, Ser: 0.69 mg/dL (ref 0.40–1.20)
GFR: 93.75 mL/min (ref >60.00)
GLUCOSE: 100 mg/dL — AB (ref 70–99)
POTASSIUM: 3.6 meq/L (ref 3.5–5.1)
SODIUM: 139 meq/L (ref 135–145)

## 2017-07-23 LAB — SEDIMENTATION RATE: Sed Rate: 26 mm/hr (ref 0–30)

## 2017-07-23 LAB — C-REACTIVE PROTEIN: CRP: 1.3 mg/dL (ref 0.5–20.0)

## 2017-07-23 MED ORDER — OMEPRAZOLE 20 MG PO CPDR: 20.0000 mg | DELAYED_RELEASE_CAPSULE | Freq: Every day | ORAL | 11 refills | Status: DC

## 2017-07-23 MED ORDER — AMOXICILLIN-POT CLAVULANATE 500-125 MG PO TABS: 1.0000 | ORAL_TABLET | Freq: Two times a day (BID) | ORAL | 0 refills | Status: DC

## 2017-07-23 NOTE — Progress Notes (Signed)
Subjective:    Patient ID: Megan Harper, female    DOB: 09-24-1960, 56 y.o.   MRN: [id number]  HPI Megan Harper is a pleasant 56 year old white female, known to Dr. Henrene Pastor from previous procedures who was last seen here in 2016. She is referred today by Dr. Tamsen Roers for evaluation of abdominal pain. Patient has history of hypothyroidism, GERD, COPD, and prior laparoscopy for endometriosis in the 1980s. She had endoscopy done in February 2016 which was a normal exam and colonoscopy with finding of one 3 mm polyp in the sigmoid colon which was hyperplastic, no diverticulosis noted. Upper abdominal ultrasound in October 2017 showed a fatty liver, no gallstones Patient says she's been having problems over the past couple of years with upper abdominal discomfort which she says has worsened. Her bowel movements have been normal. She's not had any melena or hematochezia. She describes some bloating and distention postprandially. She says she feels like there is always a heaviness in her upper abdomen which is worse if she bends over. She also has become aware of a bulge in the middle of her upper abdomen which she feels has enlarged. When she bends forward this is more uncomfortable, she is also very uncomfortable with her husband lying on her which causes a lot of pressure in her upper abdomen, and feels as if "everything is pushing up". She is also had some heartburn describing some pain that goes up into her shoulders at times with eating.  Review of Systems Pertinent positive and negative review of systems were noted in the above HPI section.  All other review of systems was otherwise negative.  Outpatient Encounter Medications as of 07/23/2017  Medication Sig  . omeprazole (PRILOSEC) 20 MG capsule Take 20 mg by mouth daily.  . [DISCONTINUED] albuterol (PROVENTIL HFA;VENTOLIN HFA) 108 (90 Base) MCG/ACT inhaler Inhale 2 puffs into the lungs every 6 (six) hours as needed for wheezing or shortness of  breath. (Patient not taking: Reported on 11/16/2016)  . [DISCONTINUED] beclomethasone (QVAR) 80 MCG/ACT inhaler Inhale 2 puffs into the lungs 2 (two) times daily. (Patient not taking: Reported on 11/16/2016)  . [DISCONTINUED] levothyroxine (SYNTHROID, LEVOTHROID) 50 MCG tablet Take 1 tablet (50 mcg total) by mouth daily before breakfast.   No facility-administered encounter medications on file as of 07/23/2017.    Allergies  Allergen Reactions  . Ceclor [Cefaclor]     Flu like symptoms  . Pantoprazole Itching   Patient Active Problem List   Diagnosis Date Noted  . Hx of thyroid nodule 08/12/2016  . Dyspnea 10/28/2015  . Upper airway cough syndrome 10/28/2015   Social History   Socioeconomic History  . Marital status: Married    Spouse name: Not on file  . Number of children: 3  . Years of education: Not on file  . Highest education level: Not on file  Social Needs  . Financial resource strain: Not on file  . Food insecurity - worry: Not on file  . Food insecurity - inability: Not on file  . Transportation needs - medical: Not on file  . Transportation needs - non-medical: Not on file  Occupational History  . 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 Smoking status: Former Smoker    Packs/day: 2.50    Years: 31.00    Pack years: 77.50    Types: Cigarettes    Last attempt to quit: 09/17/2009    Years since quitting: 7.8  . Smokeless tobacco:  Never Used  Substance and Sexual Activity  . Alcohol use: No  . Drug use: No  . Sexual activity: Not on file  Other Topics Concern  . Not on file  Social History Narrative  . Not on file    Ms. Palau's family history includes Colon cancer in her maternal grandfather; Diabetes in her paternal grandmother; Heart disease in her maternal grandmother, maternal uncle, and paternal grandmother; Hypertension in her maternal grandfather and maternal grandmother; Kidney cancer in her maternal  grandfather and maternal uncle; Lung cancer in her father; Thyroid disease in her daughter and maternal grandmother; Uterine cancer in her mother.      Objective:    Vitals:   07/23/17 1440  BP: 130/78  Pulse: 80    Physical Exam  well-developed white female in no acute distress, pleasant blood pressure 130/78 pulse 80, weight 182, BMI 29.3. HEENT; nontraumatic normocephalic EOMI PERRLA sclera anicteric, Cardiovascular; regular rate and rhythm with S1-S2, Pulmonary ;clear bilaterally, Abdomen; obese, soft she does have a midline bulge in the upper abdomen, with some tenderness to deep palpation, no palpable bowel loops.Saunders Revel is present with straining to sit up and also with bending forward Bowel sounds are present, No mass or hepatosplenomegaly, Rectal ;exam not done, Extremities; no clubbing cyanosis or edema skin warm and dry, Neuropsych; mood and affect appropriate       Assessment & Plan:   #80 57 year old female with 2-3 year history of upper abdominal bloating and pressure, worse with bending forward and with pressure on her abdomen. She does have a midline bulge which is large, rule out ventral hernia symptomatic versus diastases #2 chronic GERD #3 history of hyperplastic colon polyp, up-to-date with colon cancer surveillance last done February 2016 #4 acute sinusitis, patient requesting antibiotic until she can get in with her PCP recurrent problem  #5 COPD #6 hypothyroidism  Plan; Continue omeprazole 20 mg by mouth every morning refills sent CBC, BMETt, CRP Schedule for CT scan of the abdomen and pelvis with contrast Start Augmentin 500/125 one by mouth twice a day 10 days for acute sinusitis and patient is advised to follow-up with primary care as soon as possible.  Amy Genia Harold PA-C 07/23/2017   Cc: Kelton Pillar, MD

## 2017-07-23 NOTE — Progress Notes (Signed)
Initial assessment and plan reviewed °

## 2017-07-23 NOTE — Patient Instructions (Signed)
Your physician has requested that you go to the basement for lab work before leaving today.  We have sent the following medications to your pharmacy for you to pick up at your convenience: Augmentin and omeprazole.   You have been scheduled for a CT scan of the abdomen and pelvis at [facility] ([address]---this is in the same building as [facility]).   You are scheduled on 07/26/17 at 1:00pm. You should arrive 15 minutes prior to your appointment time for registration. Please follow the written instructions below on the day of your exam:  WARNING: IF YOU ARE ALLERGIC TO IODINE/X-RAY DYE, PLEASE NOTIFY RADIOLOGY IMMEDIATELY AT [phone]! YOU WILL BE GIVEN A 13 HOUR PREMEDICATION PREP.  1) Do not eat or drink anything after 9:00am (4 hours prior to your test) 2) You have been given 2 bottles of oral contrast to drink. The solution may taste               better if refrigerated, but do NOT add ice or any other liquid to this solution. Shake             well before drinking.    Drink 1 bottle of contrast @ 11:00am (2 hours prior to your exam)  Drink 1 bottle of contrast @ 12:00pm (1 hour prior to your exam)  You may take any medications as prescribed with a small amount of water except for the following: Metformin, Glucophage, Glucovance, Avandamet, Riomet, Fortamet, Actoplus Met, Janumet, Glumetza or Metaglip. The above medications must be held the day of the exam AND 48 hours after the exam.  The purpose of you drinking the oral contrast is to aid in the visualization of your intestinal tract. The contrast solution may cause some diarrhea. Before your exam is started, you will be given a small amount of fluid to drink. Depending on your individual set of symptoms, you may also receive an intravenous injection of x-ray contrast/dye. Plan on being at [facility] for 30 minutes or longer, depending on the type of exam you are having performed.  This test typically  takes 30-45 minutes to complete.  If you have any questions regarding your exam or if you need to reschedule, you may call the CT department at [phone] between the hours of 8:00 am and 5:00 pm, Monday-Friday.  ________________________________________________________________________  Normal BMI (Body Mass Index- based on height and weight) is between 19 and 25. Your BMI today is Body mass index is 29.38 kg/m. Marland Kitchen Please consider follow up  regarding your BMI with your Primary Care Provider.

## 2017-07-26 ENCOUNTER — Ambulatory Visit (INDEPENDENT_AMBULATORY_CARE_PROVIDER_SITE_OTHER)
Admission: RE | Admit: 2017-07-26 | Discharge: 2017-07-26 | Disposition: A | Discharge status: Home / Self Care | Payer: 59 | Source: Ambulatory Visit | Attending: Physician Assistant | Admitting: Physician Assistant

## 2017-07-26 DIAGNOSIS — R1010 Upper abdominal pain, unspecified: Secondary | ICD-10-CM | POA: Diagnosis not present

## 2017-07-26 MED ORDER — IOPAMIDOL (ISOVUE-300) INJECTION 61%
100.0000 mL | Freq: Once | INTRAVENOUS | Status: AC | PRN
  Administered 2017-07-26: 100 mL via INTRAVENOUS

## 2017-08-14 ENCOUNTER — Other Ambulatory Visit: Payer: Self-pay | Admitting: Family Medicine

## 2017-08-14 DIAGNOSIS — F17200 Nicotine dependence, unspecified, uncomplicated: Secondary | ICD-10-CM

## 2017-08-28 ENCOUNTER — Ambulatory Visit: Payer: 59

## 2017-09-16 ENCOUNTER — Ambulatory Visit: Payer: 59

## 2018-05-07 ENCOUNTER — Encounter: Payer: Self-pay | Admitting: Pulmonary Disease

## 2018-05-07 ENCOUNTER — Ambulatory Visit (INDEPENDENT_AMBULATORY_CARE_PROVIDER_SITE_OTHER): Payer: 59 | Admitting: Pulmonary Disease

## 2018-05-07 VITALS — BP 124/78 | HR 66 | Ht 66.0 in | Wt 182.0 lb

## 2018-05-07 DIAGNOSIS — R0602 Shortness of breath: Secondary | ICD-10-CM

## 2018-05-07 DIAGNOSIS — J449 Chronic obstructive pulmonary disease, unspecified: Secondary | ICD-10-CM

## 2018-05-07 DIAGNOSIS — Z87891 Personal history of nicotine dependence: Secondary | ICD-10-CM

## 2018-05-07 MED ORDER — TIOTROPIUM BROMIDE-OLODATEROL 2.5-2.5 MCG/ACT IN AERS: 2.0000 | INHALATION_SPRAY | Freq: Every day | RESPIRATORY_TRACT | 0 refills | Status: AC

## 2018-05-07 MED ORDER — TIOTROPIUM BROMIDE-OLODATEROL 2.5-2.5 MCG/ACT IN AERS: 2.0000 | INHALATION_SPRAY | Freq: Every day | RESPIRATORY_TRACT | 5 refills | Status: AC

## 2018-05-07 MED ORDER — ALBUTEROL SULFATE HFA 108 (90 BASE) MCG/ACT IN AERS: 2.0000 | INHALATION_SPRAY | Freq: Four times a day (QID) | RESPIRATORY_TRACT | 2 refills | Status: DC | PRN

## 2018-05-07 NOTE — Patient Instructions (Signed)
We will schedule you for low-dose screening CT of the chest We will start you on an inhaler called stiolto and give you albuterol rescue inhaler Schedule you for pulmonary function test Return to clinic in 2 to 3 months.

## 2018-05-07 NOTE — Addendum Note (Signed)
Addended by: Maryanna Shape A on: 05/07/2018 05:17 PM   Modules accepted: Orders

## 2018-05-07 NOTE — Progress Notes (Signed)
Megan Harper    [id number]    [birth year]  Primary Care Physician:Little, Jeneen Rinks, MD  Referring Physician: Tamsen Roers, [address] [place], [place] [address]  Chief complaint: Follow-up for COPD  HPI: Megan Harper is a 57 year old with past medical history of heavy smoking, hypothyroidism. Complains of dyspnea on exertion since 2010. Symptoms are worsening over the past year. She cannot climb more than a flight of stairs. Her symptoms associated with occasional cough, nonproductive in nature and wheezing. She was given Symbicort several years ago but did not use it. She just on albuterol inhaler which she does not use often. She has stuffy nose but no seasonal allergies, postnasal drip. She has significant issues with acid reflux and is on Nexium. She had a normal EGD in 2016.  She's had workup as noted below which included a cardiac pulmonary exercise test. Evaluated by her cardiologist Dr. Einar Gip with echo suggestive of elevated right pressures (see report below). She has thyroid surgery as few years ago and noted weight gain since then. She is trying to lose weight with exercise program and diet.  Interim History: She has not followed up in our clinic since 2017.  Currently not on inhalers Reports increasing on exertion, cough with white mucus, chest congestion  Physical Exam: Blood pressure 124/78, pulse 66, height 5\' 6"  (1.676 m), weight 182 lb (82.6 kg), SpO2 97 %. Gen:      No acute distress HEENT:  EOMI, sclera anicteric Neck:     No masses; no thyromegaly Lungs:    Clear to auscultation bilaterally; normal respiratory effort CV:         Regular rate and rhythm; no murmurs Abd:      + bowel sounds; soft, non-tender; no palpable masses, no distension Ext:    No edema; adequate peripheral perfusion Skin:      Warm and dry; no rash Neuro: alert and oriented x 3 Psych: normal mood and affect  Data Reviewed: Imaging CT scan 03/22/10 Small stable bilateral lung  nodules, likely calcified granuloma. Bilateral upper lobe emphysematous changes.  CT chest 03/22/2016 2 mm right middle lobe nodule, multiple tiny calcified granulomas.  Mild centrilobular, paraseptal emphysema.  Aortic atherosclerosis. I have reviewed the images personally.  PFTs: 07/19/14 FVC 2.31 (61%), FEV1 1.55 (52%), F/F 67, TLC 89%, RV/TLC 137, DLCO 39% Moderate obstructive disease with slight bronchial dilator response Severe diffusion defect which corrects partially for alveolar volume.  Cardiac CPET 01/02/16 Exercise testing with gas exchange demonstrates normal functional capacity when compared to matched sedentary Norms.There is no cardiac limitation noted. At peak exercise, patient appears limited due to her body habitus with related restrictive lung physiology and mild obstruction. Would recommend continued follow-up with Pulmonary as well as weight loss.   Echocardiogram 12/27/15 Normal LV cavity size and function, normal diastolic filling pattern EF 61% Trace MR, mild TR. No evidence of pulmonary hypertension. Trace PR. IVC is dilated with respiratory variation. Suggest elevated right heart pressure.  Labs: CBC 07/23/2017-WBC 8.6, eos 0.4%, absolute eosinophil count 35  Assessment:  COPD She has moderate COPD from smoking. CT scan in past showed emphysema. The PFTs were reviewed which show moderate obstructive defect with improvement in flow rates post albuterol, although this did not meet ATS criteria for a bronchodilator response. She also has significant air trapping as shown by the RV/TLC ratio.  Currently not on inhalers.  Will start on stiolto and repeat pulmonary function test.  She has disproportionate reduction in her diffusion capacity suggestive of pulmonary vascular process but echo did not show any overt pulmonary hypertension. We will continue to monitor.  CPET also shows exercise limitation from obesity and deconditioning. I encouraged weight loss  and exercise.  Pulmonary nodules Likely benign, suggestive of calcified granulomas Schedule for low-dose screening CT of the chest.  Health maintenance 05/27/2014-Pneumovax Did not want to get flu vaccine.  Plan/Recommendations: - Start stiolto, albuterol rescue inhaler - Pulmonary function test - Refer for low-dose screening CT of the chest.  Outpatient Encounter Medications as of 05/07/2018  Medication Sig  . omeprazole (PRILOSEC) 20 MG capsule Take 1 capsule (20 mg total) by mouth daily.  . [DISCONTINUED] amoxicillin-clavulanate (AUGMENTIN) 500-125 MG tablet Take 1 tablet (500 mg total) by mouth 2 (two) times daily.   No facility-administered encounter medications on file as of 05/07/2018.     Allergies as of 05/07/2018 - Review Complete 05/07/2018  Allergen Reaction Noted  . Ceclor [cefaclor]  10/28/2015  . Pantoprazole Itching 12/12/2015    Past Medical History:  Diagnosis Date  . Anxiety   . Cervical dysplasia   . COPD (chronic obstructive pulmonary disease) ([place])   . Endometriosis   . Fibrocystic breast disease   . GERD (gastroesophageal reflux disease)   . Thyroid nodule     Past Surgical History:  Procedure Laterality Date  . laproscopy for endometriosis    . THYROID LOBECTOMY Right 03/08/2010  . TUBAL LIGATION  01/23/1986    Family History  Problem Relation Age of Onset  . Heart disease Paternal Grandmother   . Diabetes Paternal Grandmother   . Hypertension Maternal Grandfather   . Colon cancer Maternal Grandfather   . Kidney cancer Maternal Grandfather   . Uterine cancer Mother   . Lung cancer Father        smoked  . Kidney cancer Maternal Uncle   . Heart disease Maternal Grandmother   . Hypertension Maternal Grandmother   . Thyroid disease Maternal Grandmother   . Heart disease Maternal Uncle   . Thyroid disease Daughter     Social History   Socioeconomic History  . Marital status: Married    Spouse name: Not on file  . Number of children:  3  . Years of education: Not on file  . Highest education level: Not on file  Occupational History  . Occupation: Armed forces technical officer: [employer]  . Financial resource strain: Not on file  . Food insecurity:    Worry: Not on file    Inability: Not on file  . Transportation needs:    Medical: Not on file    Non-medical: Not on file  Tobacco Use  . Smoking status: Former Smoker    Packs/day: 2.50    Years: 31.00    Pack years: 77.50    Types: Cigarettes    Last attempt to quit: 09/17/2009    Years since quitting: 8.6  . Smokeless tobacco: Never Used  Substance and Sexual Activity  . Alcohol use: No  . Drug use: No  . Sexual activity: Not on file  Lifestyle  . Physical activity:    Days per week: Not on file    Minutes per session: Not on file  . Stress: Not on file  Relationships  . Social connections:    Talks on phone: Not on file    Gets together: Not on file    Attends religious service: Not on file  Active member of club or organization: Not on file    Attends meetings of clubs or organizations: Not on file    Relationship status: Not on file  . Intimate partner violence:    Fear of current or ex partner: Not on file    Emotionally abused: Not on file    Physically abused: Not on file    Forced sexual activity: Not on file  Other Topics Concern  . Not on file  Social History Narrative  . Not on file    Review of systems: Review of Systems  Constitutional: Negative for fever and chills.  HENT: Negative.   Eyes: Negative for blurred vision.  Respiratory: as per HPI  Cardiovascular: Negative for chest pain and palpitations.  Gastrointestinal: Negative for vomiting, diarrhea, blood per rectum. Genitourinary: Negative for dysuria, urgency, frequency and hematuria.  Musculoskeletal: Negative for myalgias, back pain and joint pain.  Skin: Negative for itching and rash.  Neurological: Negative for dizziness, tremors, focal  weakness, seizures and loss of consciousness.  Endo/Heme/Allergies: Negative for environmental allergies.  Psychiatric/Behavioral: Negative for depression, suicidal ideas and hallucinations.  All other systems reviewed and are negative.  Marshell Garfinkel MD [facility] 05/07/2018, 1:42 PM  CC: Tamsen Roers, MD

## 2018-05-08 ENCOUNTER — Telehealth: Payer: Self-pay

## 2018-05-08 ENCOUNTER — Other Ambulatory Visit: Payer: Self-pay | Admitting: Acute Care

## 2018-05-08 DIAGNOSIS — Z122 Encounter for screening for malignant neoplasm of respiratory organs: Secondary | ICD-10-CM

## 2018-05-08 DIAGNOSIS — Z87891 Personal history of nicotine dependence: Secondary | ICD-10-CM

## 2018-05-08 NOTE — Telephone Encounter (Signed)
Medication name and strength: Stiolto Respimat 2.5 Provider: Dr. Vaughan Browner Pharmacy: [address] Patient insurance ID: [id number] Phone: [phone] Fax: [phone]  Was the PA started on CMM?  yes If yes, please enter the Key: [id number] Timeframe for approval/denial: 24-72 hours

## 2018-05-08 NOTE — Telephone Encounter (Signed)
A user error has taken place.

## 2018-05-09 NOTE — Telephone Encounter (Signed)
Medication name and strength: Stiolto Aer 2.5-2.5 PA approved/denied: denied Approval dates:  If denied, reason for denial: Requested medication is not a covered benefit and are excluded from coverage in accordance with the terms and conditions of benefit plan. Request has been administratively denied.

## 2018-05-12 NOTE — Telephone Encounter (Signed)
Dr. Vaughan Browner please advise if you would like to start appeal?  Thanks.

## 2018-05-12 NOTE — Telephone Encounter (Signed)
Can you check if anoro is covered. Otherwise start appeal. Thanks

## 2018-05-13 ENCOUNTER — Telehealth: Payer: Self-pay | Admitting: Pulmonary Disease

## 2018-05-13 NOTE — Telephone Encounter (Addendum)
Called and spoke to [employer], who stated that anoro is covered by insurance with a 10 dollar co-pay. lmtcb x1 to make pt aware.

## 2018-05-13 NOTE — Telephone Encounter (Signed)
Spoke with patient, made aware Anoro is covered by her insurance with a $10 dollar co-pay. Voiced understanding. Nothing further needed at this time.

## 2018-05-14 MED ORDER — UMECLIDINIUM-VILANTEROL 62.5-25 MCG/INH IN AEPB: 1.0000 | INHALATION_SPRAY | Freq: Every day | RESPIRATORY_TRACT | 5 refills | Status: AC

## 2018-05-14 NOTE — Telephone Encounter (Signed)
Please see phone note dated as 05/13/18. Rx for anoro has been sent to preferred pharmacy. Nothing further is needed.

## 2018-05-14 NOTE — Addendum Note (Signed)
Addended by: Maryanna Shape A on: 05/14/2018 03:19 PM   Modules accepted: Orders

## 2018-05-16 ENCOUNTER — Ambulatory Visit (INDEPENDENT_AMBULATORY_CARE_PROVIDER_SITE_OTHER): Payer: 59 | Admitting: Acute Care

## 2018-05-16 ENCOUNTER — Ambulatory Visit (INDEPENDENT_AMBULATORY_CARE_PROVIDER_SITE_OTHER)
Admission: RE | Admit: 2018-05-16 | Discharge: 2018-05-16 | Disposition: A | Discharge status: Home / Self Care | Payer: 59 | Source: Ambulatory Visit | Attending: Acute Care | Admitting: Acute Care

## 2018-05-16 ENCOUNTER — Encounter: Payer: Self-pay | Admitting: Acute Care

## 2018-05-16 DIAGNOSIS — Z122 Encounter for screening for malignant neoplasm of respiratory organs: Secondary | ICD-10-CM

## 2018-05-16 DIAGNOSIS — Z87891 Personal history of nicotine dependence: Secondary | ICD-10-CM | POA: Diagnosis not present

## 2018-05-16 NOTE — Progress Notes (Signed)
Shared Decision Making Visit Lung Cancer Screening Program [phone])   Eligibility:  Age 57 y.o.  Pack Years Smoking History Calculation 64 pack year smoking history (# packs/per year x # years smoked)  Recent History of coughing up blood  no  Unexplained weight loss? no ( >Than 15 pounds within the last 6 months )  Prior History Lung / other cancer no (Diagnosis within the last 5 years already requiring surveillance chest CT Scans).  Smoking Status Former Smoker  Former Smokers: Years since quit: 8 years  Quit Date: 09/17/2009  Visit Components:  Discussion included one or more decision making aids. yes  Discussion included risk/benefits of screening. yes  Discussion included potential follow up diagnostic testing for abnormal scans. yes  Discussion included meaning and risk of over diagnosis. yes  Discussion included meaning and risk of False Positives. yes  Discussion included meaning of total radiation exposure. yes  Counseling Included:  Importance of adherence to annual lung cancer LDCT screening. yes  Impact of comorbidities on ability to participate in the program. yes  Ability and willingness to under diagnostic treatment. yes  Smoking Cessation Counseling:  Current Smokers:   Discussed importance of smoking cessation. No>> Former smoker  Information about tobacco cessation classes and interventions provided to patient. yes  Patient provided with "ticket" for LDCT Scan. yes  Symptomatic Patient. no  Counseling  Diagnosis Code: Tobacco Use Z72.0  Asymptomatic Patient yes  Counseling (Intermediate counseling: > three minutes counseling) U0454  Former Smokers:   Discussed the importance of maintaining cigarette abstinence. yes  Diagnosis Code: Personal History of Nicotine Dependence. U98.119  Information about tobacco cessation classes and interventions provided to patient. Yes  Patient provided with "ticket" for LDCT Scan. yes  Written  Order for Lung Cancer Screening with LDCT placed in Epic. Yes (CT Chest Lung Cancer Screening Low Dose W/O CM) [id number] Z12.2-Screening of respiratory organs Z87.891-Personal history of nicotine dependence  I spent 25 minutes of face to face time with Megan Harper discussing the risks and benefits of lung cancer screening. We viewed a power point together that explained in detail the above noted topics. We took the time to pause the power point at intervals to allow for questions to be asked and answered to ensure understanding. We discussed that she had taken the single most powerful action possible to decrease her risk of developing lung cancer when she quit smoking. I counseled her to remain smoke free, and to contact me if she ever had the desire to smoke again so that I can provide resources and tools to help support the effort to remain smoke free. We discussed the time and location of the scan, and that either  Doroteo Glassman RN or I will call with the results within  24-48 hours of receiving them. She has my card and contact information in the event she needs to speak with me, in addition to a copy of the power point we reviewed as a resource. She verbalized understanding of all of the above and had no further questions upon leaving the office.     I explained to the patient that there has been a high incidence of coronary artery disease noted on these exams. I explained that this is a non-gated exam therefore degree or severity cannot be determined. This patient is not currently on statin therapy. I have asked the patient to follow-up with their PCP regarding any incidental finding of coronary artery disease and management with diet or medication as they  feel is clinically indicated. The patient verbalized understanding of the above and had no further questions.     Megan Spatz, NP 05/16/2018 4:05 PM

## 2018-05-23 ENCOUNTER — Other Ambulatory Visit: Payer: Self-pay | Admitting: Acute Care

## 2018-05-23 DIAGNOSIS — Z122 Encounter for screening for malignant neoplasm of respiratory organs: Secondary | ICD-10-CM

## 2018-05-23 DIAGNOSIS — Z87891 Personal history of nicotine dependence: Secondary | ICD-10-CM

## 2018-06-23 ENCOUNTER — Ambulatory Visit (INDEPENDENT_AMBULATORY_CARE_PROVIDER_SITE_OTHER): Payer: 59 | Admitting: Pulmonary Disease

## 2018-06-23 ENCOUNTER — Encounter: Payer: Self-pay | Admitting: Pulmonary Disease

## 2018-06-23 VITALS — BP 122/64 | HR 68 | Ht 66.0 in | Wt 178.0 lb

## 2018-06-23 DIAGNOSIS — R0602 Shortness of breath: Secondary | ICD-10-CM

## 2018-06-23 DIAGNOSIS — J449 Chronic obstructive pulmonary disease, unspecified: Secondary | ICD-10-CM

## 2018-06-23 LAB — PULMONARY FUNCTION TEST
DL/VA % pred: 101 %
DL/VA: 5.09 ml/min/mmHg/L
DLCO UNC % PRED: 72 %
DLCO unc: 19.65 ml/min/mmHg
FEF 25-75 PRE: 0.69 L/s
FEF 25-75 Post: 1.46 L/sec
FEF2575-%CHANGE-POST: 112 %
FEF2575-%PRED-POST: 55 %
FEF2575-%Pred-Pre: 26 %
FEV1-%Change-Post: 24 %
FEV1-%Pred-Post: 55 %
FEV1-%Pred-Pre: 44 %
FEV1-Post: 1.57 L
FEV1-Pre: 1.26 L
FEV1FVC-%CHANGE-POST: 5 %
FEV1FVC-%Pred-Pre: 82 %
FEV6-%CHANGE-POST: 16 %
FEV6-%Pred-Post: 63 %
FEV6-%Pred-Pre: 54 %
FEV6-PRE: 1.93 L
FEV6-Post: 2.25 L
FEV6FVC-%Change-Post: 0 %
FEV6FVC-%Pred-Post: 101 %
FEV6FVC-%Pred-Pre: 102 %
FVC-%CHANGE-POST: 17 %
FVC-%PRED-POST: 62 %
FVC-%PRED-PRE: 53 %
FVC-POST: 2.28 L
FVC-PRE: 1.94 L
PRE FEV1/FVC RATIO: 65 %
Post FEV1/FVC ratio: 69 %
Post FEV6/FVC ratio: 99 %
Pre FEV6/FVC Ratio: 99 %
RV % pred: 134 %
RV: 2.71 L
TLC % pred: 87 %
TLC: 4.71 L

## 2018-06-23 NOTE — Progress Notes (Signed)
Megan Harper    [id number]    1961/01/07  Primary Care Physician:Little, Jeneen Rinks, MD  Referring Physician: Tamsen Roers, [place], [place] [address]  Chief complaint: Follow-up for COPD  HPI: Megan Harper is a 57 year old with past medical history of heavy smoking, hypothyroidism. Complains of dyspnea on exertion since 2010. Symptoms are worsening over the past year. She cannot climb more than a flight of stairs. Her symptoms associated with occasional cough, nonproductive in nature and wheezing. She was given Symbicort several years ago but did not use it. She just on albuterol inhaler which she does not use often. She has stuffy nose but no seasonal allergies, postnasal drip. She has significant issues with acid reflux and is on Nexium. She had a normal EGD in 2016.  She's had workup as noted below which included a cardiac pulmonary exercise test. Evaluated by her cardiologist Dr. Einar Gip with echo suggestive of elevated right pressures (see report below). She has thyroid surgery as few years ago and noted weight gain since then. She is trying to lose weight with exercise program and diet.   78-pack-year smoker.  Quit in 2011.  Interim History: Prescribed Stiolto but was not covered by insurance This has been switched anoro.  Patient has picked up the prescription but not started using it yet  Outpatient Encounter Medications as of 06/23/2018  Medication Sig  . albuterol (PROVENTIL HFA;VENTOLIN HFA) 108 (90 Base) MCG/ACT inhaler Inhale 2 puffs into the lungs every 6 (six) hours as needed for wheezing or shortness of breath.  Marland Kitchen omeprazole (PRILOSEC) 20 MG capsule Take 1 capsule (20 mg total) by mouth daily.   No facility-administered encounter medications on file as of 06/23/2018.    Physical Exam: Blood pressure 122/64, pulse 68, height 5\' 6"  (1.676 m), weight 178 lb (80.7 kg), SpO2 97 %. Gen:      No acute distress HEENT:  EOMI, sclera anicteric Neck:      No masses; no thyromegaly Lungs:    Clear to auscultation bilaterally; normal respiratory effort CV:         Regular rate and rhythm; no murmurs Abd:      + bowel sounds; soft, non-tender; no palpable masses, no distension Ext:    No edema; adequate peripheral perfusion Skin:      Warm and dry; no rash Neuro: alert and oriented x 3 Psych: normal mood and affect  Data Reviewed: Imaging CT scan 03/22/10 Small stable bilateral lung nodules, likely calcified granuloma. Bilateral upper lobe emphysematous changes.  CT chest 03/22/2016 2 mm right middle lobe nodule, multiple tiny calcified granulomas.  Mild centrilobular, paraseptal emphysema.  Aortic atherosclerosis.  CT chest 05/16/2018 Stable small calcified granulomas, mild emphysema, diffuse bronchial wall thickening.  I have reviewed the images personally.  PFTs: 07/19/14 FVC 2.31 (61%), FEV1 1.55 (52%), F/F 67, TLC 89%, RV/TLC 137, DLCO 39% Moderate obstructive disease with slight bronchial dilator response Severe diffusion defect which corrects partially for alveolar volume.  06/15/2018 FVC 2.28 [62%], FEV1 1.57 [55%), F/F 69, TLC 87%, RV/TLC 152%, DLCO 72% Severe obstruction with bronchodilator response, air trapping, minimal diffusion defect.  Cardiac CPET 01/02/16 Exercise testing with gas exchange demonstrates normal functional capacity when compared to matched sedentary Norms.There is no cardiac limitation noted. At peak exercise, patient appears limited due to her body habitus with related restrictive lung physiology and mild obstruction. Would recommend continued follow-up with Pulmonary as well as weight loss.  Echocardiogram 12/27/15 Normal LV cavity size and function, normal diastolic filling pattern EF 61% Trace MR, mild TR. No evidence of pulmonary hypertension. Trace PR. IVC is dilated with respiratory variation. Suggest elevated right heart pressure.  Labs: CBC 07/23/2017-WBC 8.6, eos 0.4%, absolute  eosinophil count 35  Assessment:  COPD PFTs today reviewed with severe obstruction, or trapping and a bronchodilator response She has the Anoro inhaler but has not started using it I have given instructions on use and encouraged her to start controller medication.. She did not desat on exertion today.  CPET shows exercise limitation from obesity and deconditioning. I encouraged weight loss and exercise.  Pulmonary nodules Likely benign, suggestive of calcified granulomas Follow annual screening.  Health maintenance 05/27/2014-Pneumovax Does not want to get the flu vaccine  Plan/Recommendations: - Start anoro, continue albuterol rescue inhaler - Continue annual screening CT of the chest  CC: Tamsen Roers, MD

## 2018-06-23 NOTE — Progress Notes (Signed)
PFT done today. 

## 2018-06-23 NOTE — Patient Instructions (Signed)
Start using the Anoro inhaler.  We will give you instructions on how to use it We will check oxygen levels on exertion today. Follow-up in 6 months.

## 2018-07-11 ENCOUNTER — Ambulatory Visit: Payer: 59 | Admitting: Pulmonary Disease

## 2019-05-26 ENCOUNTER — Ambulatory Visit (INDEPENDENT_AMBULATORY_CARE_PROVIDER_SITE_OTHER)
Admission: RE | Admit: 2019-05-26 | Discharge: 2019-05-26 | Disposition: A | Discharge status: Home / Self Care | Payer: 59 | Source: Ambulatory Visit | Attending: Acute Care | Admitting: Acute Care

## 2019-05-26 ENCOUNTER — Ambulatory Visit (INDEPENDENT_AMBULATORY_CARE_PROVIDER_SITE_OTHER): Payer: 59 | Admitting: Pulmonary Disease

## 2019-05-26 ENCOUNTER — Other Ambulatory Visit: Payer: Self-pay

## 2019-05-26 ENCOUNTER — Encounter: Payer: Self-pay | Admitting: Pulmonary Disease

## 2019-05-26 VITALS — BP 122/60 | HR 65 | Ht 66.5 in | Wt 177.2 lb

## 2019-05-26 DIAGNOSIS — Z87891 Personal history of nicotine dependence: Secondary | ICD-10-CM | POA: Diagnosis not present

## 2019-05-26 DIAGNOSIS — Z122 Encounter for screening for malignant neoplasm of respiratory organs: Secondary | ICD-10-CM

## 2019-05-26 DIAGNOSIS — J449 Chronic obstructive pulmonary disease, unspecified: Secondary | ICD-10-CM | POA: Diagnosis not present

## 2019-05-26 LAB — CBC WITH DIFFERENTIAL/PLATELET
Basophils Absolute: 0.1 10*3/uL (ref 0.0–0.1)
Basophils Relative: 1.4 % (ref 0.0–3.0)
Eosinophils Absolute: 0.1 10*3/uL (ref 0.0–0.7)
Eosinophils Relative: 1.2 % (ref 0.0–5.0)
HCT: 39.8 % (ref 36.0–46.0)
Hemoglobin: 13.6 g/dL (ref 12.0–15.0)
Lymphocytes Relative: 25.1 % (ref 12.0–46.0)
Lymphs Abs: 1.6 10*3/uL (ref 0.7–4.0)
MCHC: 34.1 g/dL (ref 30.0–36.0)
MCV: 96.5 fl (ref 78.0–100.0)
Monocytes Absolute: 0.5 10*3/uL (ref 0.1–1.0)
Monocytes Relative: 7.2 % (ref 3.0–12.0)
Neutro Abs: 4.2 10*3/uL (ref 1.4–7.7)
Neutrophils Relative %: 65.1 % (ref 43.0–77.0)
Platelets: 272 10*3/uL (ref 150.0–400.0)
RBC: 4.12 Mil/uL (ref 3.87–5.11)
RDW: 12.9 % (ref 11.5–15.5)
WBC: 6.5 10*3/uL (ref 4.0–10.5)

## 2019-05-26 MED ORDER — ANORO ELLIPTA 62.5-25 MCG/INH IN AEPB: 1.0000 | INHALATION_SPRAY | Freq: Every day | RESPIRATORY_TRACT | 0 refills | Status: DC

## 2019-05-26 NOTE — Progress Notes (Signed)
Megan Harper    [id number]    03-Mar-1961  Primary Care Physician:Little, Jeneen Rinks, MD  Referring Physician: Tamsen Harper, [address] [place],  [place] [address]  Chief complaint: Follow-up for COPD  HPI: [name] is a 58 year old with past medical history of heavy smoking, hypothyroidism. Complains of dyspnea on exertion since 2010. Symptoms are worsening over the past year. She cannot climb more than a flight of stairs. Her symptoms associated with occasional cough, nonproductive in nature and wheezing. She was given Symbicort several years ago but did not use it. She just on albuterol inhaler which she does not use often. She has stuffy nose but no seasonal allergies, postnasal drip. She has significant issues with acid reflux and is on Nexium. She had a normal EGD in 2016.  She's had workup as noted below which included a cardiac pulmonary exercise test. Evaluated by her cardiologist Dr. Einar Harper with echo suggestive of elevated right pressures (see report below). She has thyroid surgery as few years ago and noted weight gain since then. She is trying to lose weight with exercise program and diet.   78-pack-year smoker.  Quit in 2011.  Interim History: Prescribed Stiolto but was not covered by insurance This has been switched anoro.  Patient has not started it due to cost issues.  She may be losing her insurance.  Continues to have dyspnea on exertion.  Outpatient Encounter Medications as of 05/26/2019  Medication Sig  . albuterol (PROVENTIL HFA;VENTOLIN HFA) 108 (90 Base) MCG/ACT inhaler Inhale 2 puffs into the lungs every 6 (six) hours as needed for wheezing or shortness of breath.  Marland Kitchen omeprazole (PRILOSEC) 20 MG capsule Take 1 capsule (20 mg total) by mouth daily. (Patient taking differently: Take 20 mg by mouth daily. As needed)   No facility-administered encounter medications on file as of 05/26/2019.    Physical Exam: Blood pressure 122/60, pulse 65, height 5' 6.5"  (1.689 m), weight 177 lb 3.2 oz (80.4 kg), SpO2 97 %. Gen:      No acute distress HEENT:  EOMI, sclera anicteric Neck:     No masses; no thyromegaly Lungs:    Clear to auscultation bilaterally; normal respiratory effort CV:         Regular rate and rhythm; no murmurs Abd:      + bowel sounds; soft, non-tender; no palpable masses, no distension Ext:    No edema; adequate peripheral perfusion Skin:      Warm and dry; no rash Neuro: alert and oriented x 3 Psych: normal mood and affect  Data Reviewed: Imaging CT scan 03/22/10 Small stable bilateral lung nodules, likely calcified granuloma. Bilateral upper lobe emphysematous changes.  CT chest 03/22/2016 2 mm right middle lobe nodule, multiple tiny calcified granulomas.  Mild centrilobular, paraseptal emphysema.  Aortic atherosclerosis.  CT chest 05/16/2018 Stable small calcified granulomas, mild emphysema, diffuse bronchial wall thickening.  I have reviewed the images personally.  CT chest 05/26/2019  PFTs: 07/19/14 FVC 2.31 (61%), FEV1 1.55 (52%), F/F 67, TLC 89%, RV/TLC 137, DLCO 39% Moderate obstructive disease with slight bronchial dilator response Severe diffusion defect which corrects partially for alveolar volume.  06/15/2018 FVC 2.28 [62%], FEV1 1.57 [55%), F/F 69, TLC 87%, RV/TLC 152%, DLCO 72% Severe obstruction with bronchodilator response, air trapping, minimal diffusion defect.  Cardiac CPET 01/02/16 Exercise testing with gas exchange demonstrates normal functional capacity when compared to matched sedentary Norms.There is no cardiac limitation noted. At peak  exercise, patient appears limited due to her body habitus with related restrictive lung physiology and mild obstruction. Would recommend continued follow-up with Pulmonary as well as weight loss.   Echocardiogram 12/27/15 Normal LV cavity size and function, normal diastolic filling pattern EF 61% Trace MR, mild TR. No evidence of pulmonary hypertension. Trace  PR. IVC is dilated with respiratory variation. Suggest elevated right heart pressure.  Labs: CBC 07/23/2017-WBC 8.6, eos 0.4%, absolute eosinophil count 35  Assessment:  COPD PFTs show severe obstruction, or trapping and a bronchodilator response She will benefit from an inhaler but has not started due to insurance issues We will give her samples of Anoro.  Offered follow-up with the pharmacy clinic to explore patient assistance options but she wants to hold off for now. She did not desat on exertion today.  CPET shows exercise limitation from obesity and deconditioning. I encouraged weight loss and exercise. Check baseline labs including CBC differential, IgE, alpha-1 antitrypsin levels and phenotype  Pulmonary nodules Likely benign, suggestive of calcified granulomas Follow annual screening.  Health maintenance 05/27/2014-Pneumovax Wants to wait for a few weeks before getting the flu vaccine  Plan/Recommendations: - Start anoro, continue albuterol rescue inhaler - Continue annual screening CT of the chest  CC: Megan Roers, MD

## 2019-05-26 NOTE — Patient Instructions (Signed)
We will check some labs today including CBC differential, IgE, alpha-1 antitrypsin levels and phenotype We will give you samples of Anoro inhaler  Continue to work on weight loss and exercise Make sure you get the flu vaccine this year  Follow-up in 6 months.

## 2019-05-28 ENCOUNTER — Other Ambulatory Visit: Payer: Self-pay | Admitting: *Deleted

## 2019-05-28 DIAGNOSIS — Z122 Encounter for screening for malignant neoplasm of respiratory organs: Secondary | ICD-10-CM

## 2019-05-28 DIAGNOSIS — Z87891 Personal history of nicotine dependence: Secondary | ICD-10-CM

## 2019-05-30 LAB — ALPHA-1 ANTITRYPSIN PHENOTYPE: A-1 Antitrypsin, Ser: 142 mg/dL (ref 83–199)

## 2019-05-30 LAB — IGE: IgE (Immunoglobulin E), Serum: 8 kU/L

## 2019-06-02 ENCOUNTER — Ambulatory Visit: Payer: 59

## 2019-06-03 ENCOUNTER — Telehealth: Payer: Self-pay | Admitting: Pulmonary Disease

## 2019-06-03 NOTE — Telephone Encounter (Signed)
Call returned to patient, states she is returning a call for her lab results. Made aware of results per PM:  Notes recorded by Marshell Garfinkel, MD on 06/03/2019 at 10:11 AM EDT  Labs are normal  Voiced understanding. Nothing further is needed at this time.

## 2019-08-25 ENCOUNTER — Other Ambulatory Visit: Payer: Self-pay | Admitting: Pulmonary Disease

## 2020-03-15 ENCOUNTER — Ambulatory Visit: Payer: 59 | Admitting: Physician Assistant

## 2020-06-15 ENCOUNTER — Other Ambulatory Visit: Payer: Self-pay

## 2020-06-15 DIAGNOSIS — Z20822 Contact with and (suspected) exposure to covid-19: Secondary | ICD-10-CM

## 2020-06-17 ENCOUNTER — Telehealth (HOSPITAL_COMMUNITY): Payer: Self-pay

## 2020-06-17 ENCOUNTER — Telehealth: Payer: Self-pay

## 2020-06-17 LAB — SARS-COV-2, NAA 2 DAY TAT

## 2020-06-17 LAB — NOVEL CORONAVIRUS, NAA: SARS-CoV-2, NAA: DETECTED — AB

## 2020-06-17 NOTE — Telephone Encounter (Signed)
Pt. Checking on COVID 19 results, not available yet.

## 2020-06-17 NOTE — Telephone Encounter (Signed)
Called to Discuss with patient about Covid symptoms and the use of the monoclonal antibody infusion for those with mild to moderate Covid symptoms and at a high risk of hospitalization.     Pt is qualified for this infusion due to co-morbid conditions and/or a member of an at-risk group.     Patient Active Problem List   Diagnosis Date Noted  . Hx of thyroid nodule 08/12/2016  . Dyspnea 10/28/2015  . Upper airway cough syndrome 10/28/2015    Patient declines infusion at this time. Symptoms tier reviewed as well as criteria for ending isolation. Preventative practices reviewed. Patient verbalized understanding.    Patient advised to call back if he/she decides that he/she does want to get infusion. Callback number to the infusion center given. Patient advised to go to Urgent care or ED with severe symptoms.

## 2020-06-29 ENCOUNTER — Ambulatory Visit (INDEPENDENT_AMBULATORY_CARE_PROVIDER_SITE_OTHER): Payer: 59

## 2020-06-29 ENCOUNTER — Encounter: Payer: Self-pay | Admitting: Pulmonary Disease

## 2020-06-29 ENCOUNTER — Ambulatory Visit: Payer: 59 | Admitting: Pulmonary Disease

## 2020-06-29 ENCOUNTER — Other Ambulatory Visit: Payer: Self-pay

## 2020-06-29 VITALS — BP 114/64 | HR 72 | Temp 97.7°F | Ht 66.5 in | Wt 172.0 lb

## 2020-06-29 DIAGNOSIS — Z87891 Personal history of nicotine dependence: Secondary | ICD-10-CM | POA: Diagnosis not present

## 2020-06-29 DIAGNOSIS — Z8616 Personal history of COVID-19: Secondary | ICD-10-CM

## 2020-06-29 DIAGNOSIS — J449 Chronic obstructive pulmonary disease, unspecified: Secondary | ICD-10-CM

## 2020-06-29 DIAGNOSIS — Z0000 Encounter for general adult medical examination without abnormal findings: Secondary | ICD-10-CM | POA: Insufficient documentation

## 2020-06-29 DIAGNOSIS — Z79899 Other long term (current) drug therapy: Secondary | ICD-10-CM | POA: Insufficient documentation

## 2020-06-29 DIAGNOSIS — Z1389 Encounter for screening for other disorder: Secondary | ICD-10-CM | POA: Insufficient documentation

## 2020-06-29 MED ORDER — BENZONATATE 200 MG PO CAPS: 200.0000 mg | ORAL_CAPSULE | Freq: Three times a day (TID) | ORAL | 3 refills | Status: DC | PRN

## 2020-06-29 MED ORDER — BREZTRI AEROSPHERE 160-9-4.8 MCG/ACT IN AERO: 2.0000 | INHALATION_SPRAY | Freq: Two times a day (BID) | RESPIRATORY_TRACT | 0 refills | Status: DC

## 2020-06-29 NOTE — Assessment & Plan Note (Signed)
Former smoker.  Due for September/2021 lung cancer screening CT  Plan: We will obtain chest x-ray today If chest x-ray is clear we will send message to lung cancer screening team to get her scheduled for her screening CT If COVID-19 pneumonia seen on chest x-ray will have to delay screening CT by 2 to 3 months

## 2020-06-29 NOTE — Assessment & Plan Note (Signed)
Patient reports a previous "reaction" to the flu vaccine.  She describes this as fatigue, body aches, nausea, vomiting, fevers.  She reports that the symptoms all quickly resolved when she started drinking Powerade within a matter of an hour.  She denies angioedema or sudden increase shortness of breath or chest pain.  Discussion: Given patient's chronic lung disease.  COPD Gold stage II based off of 2019 PFTs.  We would still recommend seasonal flu vaccine in fall/2021 as well as obtaining COVID-19 vaccinations when she has acutely recovered from current COVID-19 illness.  I have discussed this with the patient.

## 2020-06-29 NOTE — Assessment & Plan Note (Signed)
06/15/2020-SARS-CoV-2-positive Patient declined monoclonal antibody infusion Patient is unvaccinated COVID-19 Patient did not require hospitalization Patient with ongoing cough and shortness of breath status post COVID-19 infection  Plan: Walk today in office Chest x-ray today Can use Delsym over-the-counter cough medicine for suppression of cough Can use Tessalon Perles for suppression of cough We will obtain pulmonary function testing in 3 months with follow-up with our office

## 2020-06-29 NOTE — Addendum Note (Signed)
Addended by: Lorretta Harp on: 06/29/2020 01:31 PM   Modules accepted: Orders

## 2020-06-29 NOTE — Progress Notes (Signed)
@Patient  ID: Megan Harper, female    DOB: 08/23/1961, 59 y.o.   MRN: [id number]  Chief Complaint  Patient presents with  . Follow-up    Pt was diagnosed with Covid 10/22 after being exposed to her grandson. Pt had complaints of cough and fever. States fever broke 10/30. Pt has also had SOB.    Referring provider: Tamsen Roers, MD  HPI:  59 year old female former smoker found our office for dyspnea on exertion, COPD, history of COVID-19 infection in October/2021.  Patient did not require hospitalization.  Does not appear the patient received the monoclonal antibody infusion.  PMH: History of thyroid nodules Smoker/ Smoking History: Former smoker.  Quit 2011.  77.5-pack-year smoking history. Maintenance:   Pt of: Dr. Vaughan Browner  06/29/2020  - Visit   59 year old female former smoker presenting to her office today as a acute visit for worsening cough status post COVID-19 infection in October/2021.  Patient was last seen in our office in September/2020 for COPD.  She is established with Dr. Vaughan Browner.  She was encouraged to start Anoro Ellipta.  And continue annual screening CTs of her chest.  Patient tested positive for Covid on 06/15/2020.  She was offered monoclonal antibody infusion which she declined.  Patient is unvaccinated COVID-19. She did not require hospitalization.  Patient reports that she is having ongoing cough today.  She has not had any fever since 06/25/2020.  She reports that she did obtain insurance as of September/2021.  She still has concerns about affording her inhalers.  She reports that when she looked at her formulary with her insurance the Anoro Ellipta medication was around $150 a month.  She is unsure about other inhalers.  She reports that her shortness of breath was stable since last seen Dr. Vaughan Browner until she contracted Covid.  Unfortunately with Covid has worsened.  She leads a fairly sedentary lifestyle.  She is requesting a handicap placard  today.   Questionaires / Pulmonary Flowsheets:   ACT:  No flowsheet data found.  MMRC: mMRC Dyspnea Scale mMRC Score  06/29/2020 3  05/26/2019 1    Epworth:  No flowsheet data found.  Tests:   Imaging CT scan 03/22/10 Small stable bilateral lung nodules, likely calcified granuloma. Bilateral upper lobe emphysematous changes.  CT chest 03/22/2016 2 mm right middle lobe nodule, multiple tiny calcified granulomas.  Mild centrilobular, paraseptal emphysema.  Aortic atherosclerosis.  CT chest 05/16/2018 Stable small calcified granulomas, mild emphysema, diffuse bronchial wall thickening.    CT chest 05/26/2019 Lung RADS one, negative, mild centrilobular and paraseptal emphysema  PFTs: 07/19/14 FVC 2.31 (61%), FEV1 1.55 (52%), F/F 67, TLC 89%, RV/TLC 137, DLCO 39% Moderate obstructive disease with slight bronchial dilator response Severe diffusion defect which corrects partially for alveolar volume.  06/15/2018 FVC 2.28 [62%], FEV1 1.57 [55%), F/F 69, TLC 87%, RV/TLC 152%, DLCO 72% Severe obstruction with bronchodilator response, air trapping, minimal diffusion defect.  Cardiac CPET 01/02/16 Exercise testing with gas exchange demonstrates normal functional capacity when compared to matched sedentary Norms.There is no cardiac limitation noted. At peak exercise, patient appears limited due to her body habitus with related restrictive lung physiology and mild obstruction. Would recommend continued follow-up with Pulmonary as well as weight loss.   Echocardiogram 12/27/15 Normal LV cavity size and function, normal diastolic filling pattern EF 61% Trace MR, mild TR. No evidence of pulmonary hypertension. Trace PR. IVC is dilated with respiratory variation. Suggest elevated right heart pressure.  Labs: CBC 07/23/2017-WBC 8.6, eos 0.4%, absolute  eosinophil count 35   FENO:  No results found for: NITRICOXIDE  PFT: PFT Results Latest Ref Rng & Units 06/23/2018 07/19/2014   FVC-Pre L 1.94 2.31  FVC-Predicted Pre % 53 61  FVC-Post L 2.28 2.40  FVC-Predicted Post % 62 64  Pre FEV1/FVC % % 65 67  Post FEV1/FCV % % 69 71  FEV1-Pre L 1.26 1.55  FEV1-Predicted Pre % 44 52  FEV1-Post L 1.57 1.70  DLCO uncorrected ml/min/mmHg 19.65 10.66  DLCO UNC% % 72 39  DLCO corrected ml/min/mmHg - 10.66  DLCO COR %Predicted % - 39  DLVA Predicted % 101 102  TLC L 4.71 4.80  TLC % Predicted % 87 89  RV % Predicted % 134 122    WALK:  SIX MIN WALK 06/23/2018 10/28/2015  Supplimental Oxygen during Test? (L/min) No No  Tech Comments: patient tolerated well, walked at a moderate pace, no lightheadedness, dizziness, or SOB. fast pace/min SOB//lmr    Imaging: No results found.  Lab Results:  CBC    Component Value Date/Time   WBC 6.5 05/26/2019 1432   RBC 4.12 05/26/2019 1432   HGB 13.6 05/26/2019 1432   HCT 39.8 05/26/2019 1432   PLT 272.0 05/26/2019 1432   MCV 96.5 05/26/2019 1432   MCHC 34.1 05/26/2019 1432   RDW 12.9 05/26/2019 1432   LYMPHSABS 1.6 05/26/2019 1432   MONOABS 0.5 05/26/2019 1432   EOSABS 0.1 05/26/2019 1432   BASOSABS 0.1 05/26/2019 1432    BMET    Component Value Date/Time   NA 139 07/23/2017 1528   K 3.6 07/23/2017 1528   CL 101 07/23/2017 1528   CO2 30 07/23/2017 1528   GLUCOSE 100 (H) 07/23/2017 1528   BUN 7 07/23/2017 1528   CREATININE 0.69 07/23/2017 1528   CALCIUM 9.5 07/23/2017 1528   GFRNONAA >60 03/02/2010 1535   GFRAA  03/02/2010 1535    >60        The eGFR has been calculated using the MDRD equation. This calculation has not been validated in all clinical situations. eGFR's persistently <60 mL/min signify possible Chronic Kidney Disease.    BNP No results found for: BNP  ProBNP No results found for: PROBNP  Specialty Problems      Pulmonary Problems   Dyspnea    PFT's  07/19/14   FEV1 1.70 (57 % ) ratio 71  p 10 % improvement from saba with DLCO  39 % corrects to 102% for alv vol ERV 40%  -  10/28/2015   Walked RA x 3 laps @ 185 ft each stopped due to  End of study, fast  pace, min sob no desat   - cpst 01/02/16 c/w obesity/conditioning      Upper airway cough syndrome    Allergy profile 10/28/2015 >  Eos 0.1 /  IgE  6 neg RAST       COPD with chronic bronchitis and emphysema (HCC)      Allergies  Allergen Reactions  . Ceclor [Cefaclor]     Flu like symptoms  . Pantoprazole Itching    Immunization History  Administered Date(s) Administered  . Pneumococcal-Unspecified 05/27/2014   Patient reports a "reaction" to the flu vaccine.  She denies angioedema or shortness of breath.  It appears upon discussion with the patient this is likely the interferon process.  She reports that the symptoms quickly resolved within a matter of an hour by drinking Gatorade.  Based off the information provided as I discussed with the patient  today I would recommend the seasonal flu vaccine as well as the COVID-19 vaccinations  Past Medical History:  Diagnosis Date  . Anxiety   . Cervical dysplasia   . COPD (chronic obstructive pulmonary disease) ([place])   . Endometriosis   . Fibrocystic breast disease   . GERD (gastroesophageal reflux disease)   . Thyroid nodule     Tobacco History: Social History   Tobacco Use  Smoking Status Former Smoker  . Packs/day: 2.50  . Years: 31.00  . Pack years: 77.50  . Types: Cigarettes  . Quit date: 09/17/2009  . Years since quitting: 10.7  Smokeless Tobacco Never Used   Counseling given: Not Answered   Continue to not smoke  Outpatient Encounter Medications as of 06/29/2020  Medication Sig  . albuterol (PROVENTIL HFA;VENTOLIN HFA) 108 (90 Base) MCG/ACT inhaler Inhale 2 puffs into the lungs every 6 (six) hours as needed for wheezing or shortness of breath.  Marland Kitchen azithromycin (ZITHROMAX) 250 MG tablet Take by mouth.  Marland Kitchen omeprazole (PRILOSEC) 20 MG capsule Take 1 capsule (20 mg total) by mouth daily. (Patient taking differently: Take 20 mg by mouth daily. As  needed)  . ALPRAZolam (XANAX) 0.25 MG tablet alprazolam 0.25 mg tablet (Patient not taking: Reported on 06/29/2020)  . ANORO ELLIPTA 62.5-25 MCG/INH AEPB INHALE 1 PUFF BY MOUTH ONCE DAILY FOR 1 DAY (Patient not taking: Reported on 06/29/2020)  . benzonatate (TESSALON) 200 MG capsule Take 1 capsule (200 mg total) by mouth 3 (three) times daily as needed for cough.   No facility-administered encounter medications on file as of 06/29/2020.     Review of Systems  Review of Systems  Constitutional: Positive for fatigue. Negative for activity change and fever.  HENT: Positive for congestion. Negative for sinus pressure, sinus pain and sore throat.   Respiratory: Positive for cough, shortness of breath and wheezing.   Cardiovascular: Negative for chest pain and palpitations.  Gastrointestinal: Negative for diarrhea, nausea and vomiting.  Musculoskeletal: Negative for arthralgias.  Neurological: Negative for dizziness.  Psychiatric/Behavioral: Negative for sleep disturbance. The patient is not nervous/anxious.      Physical Exam  BP 114/64 (BP Location: Left Arm, Cuff Size: Normal)   Pulse 72   Temp 97.7 F (36.5 C) (Other (Comment)) Comment (Src): wrist  Ht 5' 6.5" (1.689 m)   Wt 172 lb (78 kg)   SpO2 97%   BMI 27.35 kg/m   Wt Readings from Last 5 Encounters:  06/29/20 172 lb (78 kg)  05/26/19 177 lb 3.2 oz (80.4 kg)  06/23/18 178 lb (80.7 kg)  05/07/18 182 lb (82.6 kg)  07/23/17 182 lb (82.6 kg)    BMI Readings from Last 5 Encounters:  06/29/20 27.35 kg/m  05/26/19 28.17 kg/m  06/23/18 28.73 kg/m  05/07/18 29.38 kg/m  07/23/17 29.38 kg/m     Physical Exam Vitals and nursing note reviewed.  Constitutional:      General: She is not in acute distress.    Appearance: Normal appearance. She is normal weight.  HENT:     Head: Normocephalic and atraumatic.     Right Ear: Tympanic membrane, ear canal and external ear normal. There is no impacted cerumen.     Left Ear:  Tympanic membrane, ear canal and external ear normal. There is no impacted cerumen.     Nose: Rhinorrhea present. No congestion.     Mouth/Throat:     Mouth: Mucous membranes are moist.     Pharynx: Oropharynx is clear.  Eyes:  Pupils: Pupils are equal, round, and reactive to light.  Cardiovascular:     Rate and Rhythm: Normal rate and regular rhythm.     Pulses: Normal pulses.     Heart sounds: Normal heart sounds. No murmur heard.   Pulmonary:     Breath sounds: Normal breath sounds. No decreased air movement. No decreased breath sounds, wheezing or rales.  Musculoskeletal:     Cervical back: Normal range of motion.  Skin:    General: Skin is warm and dry.     Capillary Refill: Capillary refill takes less than 2 seconds.  Neurological:     General: No focal deficit present.     Mental Status: She is alert and oriented to person, place, and time. Mental status is at baseline.     Gait: Gait normal.  Psychiatric:        Mood and Affect: Mood normal.        Behavior: Behavior normal.        Thought Content: Thought content normal.        Judgment: Judgment normal.       Assessment & Plan:   COPD with chronic bronchitis and emphysema ([place]) Reviewed 2019 pulmonary function testing with patient Explained to patient that it showed COPD Gold stage II with positive bronchodilator response Previously has been managed on Symbicort as well as Anoro Ellipta inhalers Has not been on a maintenance inhaler for at least the last 6 to 9 months Dyspnea has been stable since last office visit in September/2020 with Dr. Vaughan Browner Patient is currently unvaccinated COVID-19 Patient currently has not received the seasonal flu vaccine She leads a sedentary lifestyle She has an albuterol inhaler but she is scared to use it as she reports that it feels like there is liquid going down her throat  Plan: Trial of Breztri  >>> We will utilize this inhaler given the supply of samples that we have,  likely patient would benefit from either being on an ICS/LABA or LAMA/LABA. We will have patient investigate inhaler coverage from her current insurance Encouraged her to utilize her rescue inhaler every 4-6 hours as needed for shortness of breath or wheezing Chest x-ray today Walk today in office Encouraged her to obtain the seasonal flu vaccine in fall/2021 Encouraged her to receive COVID-19 vaccination when she is recovered from her current COVID-19 illness We will order pulmonary function testing with follow-up to office in 3 months  Medication management New insurance coverage Has had difficulties with affording inhalers in the past  Plan: Have provided a list of inhalers for the patient to check her insurance formulary for the cost Patient will contact our office over the next 2 weeks and let us know what the cost of these inhalers would be Samples of Breztri provided today  History of COVID-19 06/15/2020-SARS-CoV-2-positive Patient declined monoclonal antibody infusion Patient is unvaccinated COVID-19 Patient did not require hospitalization Patient with ongoing cough and shortness of breath status post COVID-19 infection  Plan: Walk today in office Chest x-ray today Can use Delsym over-the-counter cough medicine for suppression of cough Can use Tessalon Perles for suppression of cough We will obtain pulmonary function testing in 3 months with follow-up with our office  Healthcare maintenance Patient reports a previous "reaction" to the flu vaccine.  She describes this as fatigue, body aches, nausea, vomiting, fevers.  She reports that the symptoms all quickly resolved when she started drinking Powerade within a matter of an hour.  She denies angioedema or sudden  increase shortness of breath or chest pain.  Discussion: Given patient's chronic lung disease.  COPD Gold stage II based off of 2019 PFTs.  We would still recommend seasonal flu vaccine in fall/2021 as well as  obtaining COVID-19 vaccinations when she has acutely recovered from current COVID-19 illness.  I have discussed this with the patient.  Former smoker Former smoker.  Due for September/2021 lung cancer screening CT  Plan: We will obtain chest x-ray today If chest x-ray is clear we will send message to lung cancer screening team to get her scheduled for her screening CT If COVID-19 pneumonia seen on chest x-ray will have to delay screening CT by 2 to 3 months    Return in about 3 months (around 09/29/2020), or if symptoms worsen or fail to improve, for Follow up with Dr. Vaughan Browner, Follow up for FULL PFT - 60 min.   Lauraine Rinne, NP 06/29/2020   This appointment required 50 minutes of patient care (this includes precharting, chart review, review of results, face-to-face care, etc.).

## 2020-06-29 NOTE — Patient Instructions (Addendum)
You were seen today by Lauraine Rinne, NP  for:   1. COPD with chronic bronchitis and emphysema ([place])  - Pulmonary function test; Future  Walk today in office   Trial of Breztri >>> 2 puffs in the morning right when you wake up, rinse out your mouth after use, 12 hours later 2 puffs, rinse after use >>> Take this daily, no matter what >>> This is not a rescue inhaler   Only use your albuterol as a rescue medication to be used if you can't catch your breath by resting or doing a relaxed purse lip breathing pattern.  - The less you use it, the better it will work when you need it. - Ok to use up to 2 puffs  every 4 hours if you must but call for immediate appointment if use goes up over your usual need - Don't leave home without it !!  (think of it like the spare tire for your car)   Note your daily symptoms > remember "red flags" for COPD:   >>>Increase in cough >>>increase in sputum production >>>increase in shortness of breath or activity  intolerance.   If you notice these symptoms, please call the office to be seen.   Work on increasing your overall physical activity  2. History of COVID-19  - DG Chest 2 View; Future - benzonatate (TESSALON) 200 MG capsule; Take 1 capsule (200 mg total) by mouth 3 (three) times daily as needed for cough.  Dispense: 30 capsule; Refill: 3  I have sent in Tessalon cough Perles free to be able to utilize to help with suppressing her cough  Chest x-ray today  Can also use Delsym over-the-counter cough medicine  3. Former smoker  - DG Chest 2 View; Future  I will send a message to the lung cancer screening team to get you rescheduled for your screening CT that was due in September/2021  4. Medication management  I believe you would be of benefit from being on maintenance inhalers.  We are limited with our sample supplies today.  I will provide you samples for a inhaler called Breztri.  I will provide you a 4-week supply.  This will give [place]  2 to 4 weeks to do the research below:  I would like for you to research inhaler cost with your insurance formulary.  Please look up the cost for these medications listed below:  Anoro Ellipta Trelegy Ellipta Bevespi Breztri  Symbicort 160 Stiolto Respimat  Please notify our office via MyChart message or calling and notify us of the cost of these inhalers.  Whichever one is most affordable can be what we place you on  5. Healthcare maintenance  Despite your previous symptoms when you have received the flu vaccine I still would recommend the seasonal flu vaccine for fall/2021 given your known chronic lung disease-COPD  Would recommend that you plan on obtaining your COVID-19 vaccination in about 30 to 60 days as you recover from your current COVID-19 infection   We recommend today:  Orders Placed This Encounter  Procedures  . DG Chest 2 View    Standing Status:   Future    Number of Occurrences:   1    Standing Expiration Date:   10/27/2020    Order Specific Question:   Reason for Exam (SYMPTOM  OR DIAGNOSIS REQUIRED)    Answer:   post covid19    Order Specific Question:   Preferred imaging location?    Answer:   Internal  Order Specific Question:   Radiology Contrast Protocol - do NOT remove file path    Answer:   [email or web address]  . Pulmonary function test    Standing Status:   Future    Standing Expiration Date:   06/29/2021    Order Specific Question:   Where should this test be performed?    Answer:   [facility]    Order Specific Question:   Full PFT: includes the following: basic spirometry, spirometry pre & post bronchodilator, diffusion capacity (DLCO), lung volumes    Answer:   Full PFT   Orders Placed This Encounter  Procedures  . DG Chest 2 View  . Pulmonary function test   Meds ordered this encounter  Medications  . benzonatate (TESSALON) 200 MG capsule    Sig: Take 1 capsule (200 mg total) by  mouth 3 (three) times daily as needed for cough.    Dispense:  30 capsule    Refill:  3    Follow Up:    Return in about 3 months (around 09/29/2020), or if symptoms worsen or fail to improve, for Follow up with Dr. Vaughan Browner, Follow up for FULL PFT - 60 min.   Notification of test results are managed in the following manner: If there are  any recommendations or changes to the  plan of care discussed in office today,  we will contact you and let you know what they are. If you do not hear from [place], then your results are normal and you can view them through your  MyChart account , or a letter will be sent to you. Thank you again for trusting [place] with your care  - Thank you, [facility]    It is flu season:   >>> Best ways to protect herself from the flu: Receive the yearly flu vaccine, practice good hand hygiene washing with soap and also using hand sanitizer when available, eat a nutritious meals, get adequate rest, hydrate appropriately       Please contact the office if your symptoms worsen or you have concerns that you are not improving.   Thank you for choosing [facility] for your healthcare, and for allowing [place] to partner with you on your healthcare journey. I am thankful to be able to provide care to you today.   Wyn Quaker FNP-C

## 2020-06-29 NOTE — Assessment & Plan Note (Signed)
New insurance coverage Has had difficulties with affording inhalers in the past  Plan: Have provided a list of inhalers for the patient to check her insurance formulary for the cost Patient will contact our office over the next 2 weeks and let us know what the cost of these inhalers would be Samples of Breztri provided today

## 2020-06-29 NOTE — Assessment & Plan Note (Addendum)
Reviewed 2019 pulmonary function testing with patient Explained to patient that it showed COPD Gold stage II with positive bronchodilator response Previously has been managed on Symbicort as well as Anoro Ellipta inhalers Has not been on a maintenance inhaler for at least the last 6 to 9 months Dyspnea has been stable since last office visit in September/2020 with Dr. Vaughan Browner Patient is currently unvaccinated COVID-33 Patient currently has not received the seasonal flu vaccine She leads a sedentary lifestyle She has an albuterol inhaler but she is scared to use it as she reports that it feels like there is liquid going down her throat  Plan: Trial of Breztri  >>> We will utilize this inhaler given the supply of samples that we have, likely patient would benefit from either being on an ICS/LABA or LAMA/LABA. We will have patient investigate inhaler coverage from her current insurance Encouraged her to utilize her rescue inhaler every 4-6 hours as needed for shortness of breath or wheezing Chest x-ray today Walk today in office Encouraged her to obtain the seasonal flu vaccine in fall/2021 Encouraged her to receive COVID-19 vaccination when she is recovered from her current COVID-19 illness We will order pulmonary function testing with follow-up to office in 3 months

## 2020-07-01 ENCOUNTER — Other Ambulatory Visit: Payer: Self-pay | Admitting: Pulmonary Disease

## 2020-07-01 DIAGNOSIS — Z8616 Personal history of COVID-19: Secondary | ICD-10-CM

## 2020-07-19 ENCOUNTER — Ambulatory Visit: Payer: 59 | Admitting: Family Medicine

## 2020-07-19 ENCOUNTER — Other Ambulatory Visit: Payer: Self-pay

## 2020-07-19 ENCOUNTER — Encounter: Payer: Self-pay | Admitting: Family Medicine

## 2020-07-19 VITALS — BP 128/52 | HR 88 | Temp 98.2°F | Wt 178.0 lb

## 2020-07-19 DIAGNOSIS — Z8616 Personal history of COVID-19: Secondary | ICD-10-CM

## 2020-07-19 DIAGNOSIS — E890 Postprocedural hypothyroidism: Secondary | ICD-10-CM

## 2020-07-19 DIAGNOSIS — Z7689 Persons encountering health services in other specified circumstances: Secondary | ICD-10-CM

## 2020-07-19 DIAGNOSIS — J449 Chronic obstructive pulmonary disease, unspecified: Secondary | ICD-10-CM

## 2020-07-19 NOTE — Progress Notes (Signed)
New patient visit   Patient: Megan Harper   DOB: Jan 21, 1961   59 y.o. Female  MRN: [id number] Visit Date: 07/19/2020  Today's healthcare provider: Vernie Murders, PA   No chief complaint on file.  Subjective    Megan Harper is a 59 y.o. female who presents today as a new patient to establish care.  HPI   Patient is here to establish care but also wants to be checked as a post Covid visit.  She states she was positive on 06/15/20.  She continues to have sinus symptoms and cough.  Past Medical History:  Diagnosis Date   Anxiety    Cervical dysplasia    COPD (chronic obstructive pulmonary disease) (HCC)    Endometriosis    Fibrocystic breast disease    GERD (gastroesophageal reflux disease)    Thyroid nodule    Past Surgical History:  Procedure Laterality Date   laproscopy for endometriosis     THYROID LOBECTOMY Right 03/08/2010   TUBAL LIGATION  01/23/1986    Family History  Problem Relation Age of Onset   Heart disease Paternal Grandmother    Diabetes Paternal Grandmother    Hypertension Maternal Grandfather    Colon cancer Maternal Grandfather    Kidney cancer Maternal Grandfather    Uterine cancer Mother    Lung cancer Father        smoked   Kidney cancer Maternal Uncle    Heart disease Maternal Grandmother    Hypertension Maternal Grandmother    Thyroid disease Maternal Grandmother    Heart disease Maternal Uncle    Thyroid disease Daughter    Social History   Socioeconomic History   Marital status: Married    Spouse name: Not on file   Number of children: 3   Years of education: Not on file   Highest education level: Not on file  Occupational History   Occupation: customer advocate    Employer: [employer]  Tobacco Use   Smoking status: Former Smoker    Packs/day: 2.50    Years: 31.00    Pack years: 77.50    Types: Cigarettes    Quit date: 09/17/2009    Years since quitting: 10.8   Smokeless tobacco: Never Used  Substance and  Sexual Activity   Alcohol use: No   Drug use: No   Sexual activity: Not on file  Other Topics Concern   Not on file  Social History Narrative   Not on file   Social Determinants of Health   Financial Resource Strain:    Difficulty of Paying Living Expenses: Not on file  Food Insecurity:    Worried About Charity fundraiser in the Last Year: Not on file   [employer] of Food in the Last Year: Not on file  Transportation Needs:    Lack of Transportation (Medical): Not on file   Lack of Transportation (Non-Medical): Not on file  Physical Activity:    Days of Exercise per Week: Not on file   Minutes of Exercise per Session: Not on file  Stress:    Feeling of Stress : Not on file  Social Connections:    Frequency of Communication with Friends and Family: Not on file   Frequency of Social Gatherings with Friends and Family: Not on file   Attends Religious Services: Not on file   Active Member of Clubs or Organizations: Not on file   Attends Archivist Meetings: Not on file   Marital Status:  Not on file   Outpatient Medications Prior to Visit  Medication Sig   albuterol (PROVENTIL HFA;VENTOLIN HFA) 108 (90 Base) MCG/ACT inhaler Inhale 2 puffs into the lungs every 6 (six) hours as needed for wheezing or shortness of breath.   Budeson-Glycopyrrol-Formoterol (BREZTRI AEROSPHERE) 160-9-4.8 MCG/ACT AERO Inhale 2 puffs into the lungs in the morning and at bedtime.   fluticasone (FLONASE) 50 MCG/ACT nasal spray Place 2 sprays into both nostrils daily.   omeprazole (PRILOSEC) 20 MG capsule Take 1 capsule (20 mg total) by mouth daily. (Patient taking differently: Take 20 mg by mouth daily. As needed)   ALPRAZolam (XANAX) 0.25 MG tablet alprazolam 0.25 mg tablet (Patient not taking: Reported on 06/29/2020)   ANORO ELLIPTA 62.5-25 MCG/INH AEPB INHALE 1 PUFF BY MOUTH ONCE DAILY FOR 1 DAY (Patient not taking: Reported on 06/29/2020)   azithromycin (ZITHROMAX) 250 MG tablet Take by mouth.  (Patient not taking: Reported on 07/19/2020)   benzonatate (TESSALON) 200 MG capsule Take 1 capsule (200 mg total) by mouth 3 (three) times daily as needed for cough. (Patient not taking: Reported on 07/19/2020)   No facility-administered medications prior to visit.   Allergies  Allergen Reactions   Ceclor [Cefaclor]     Flu like symptoms   Pantoprazole Itching    Immunization History  Administered Date(s) Administered   Pneumococcal-Unspecified 05/27/2014    Health Maintenance  Topic Date Due   Hepatitis C Screening  Never done   COVID-19 Vaccine (1) Never done   HIV Screening  Never done   TETANUS/TDAP  Never done   MAMMOGRAM  05/19/2012   PAP SMEAR-Modifier  10/30/2019   INFLUENZA VACCINE  11/24/2020 (Originally 03/27/2020)   COLONOSCOPY  09/27/2024    Patient Care Team: Kinston Magnan, Vickki Muff, PA as PCP - General (Family Medicine)  Review of Systems  Constitutional: Positive for fatigue.  HENT: Positive for ear pain and sinus pressure.   Respiratory: Positive for cough and wheezing.   Cardiovascular: Positive for palpitations.  Gastrointestinal: Positive for abdominal distention and nausea.  Endocrine: Positive for cold intolerance and heat intolerance.  Musculoskeletal: Positive for arthralgias, back pain, joint swelling and neck stiffness.      Objective    BP (!) 128/52 (BP Location: Right Arm, Patient Position: Sitting, Cuff Size: Normal)   Pulse 88   Temp 98.2 F (36.8 C) (Oral)   Wt 178 lb (80.7 kg)   SpO2 97%   BMI 28.30 kg/m  Physical Exam Constitutional:      General: She is not in acute distress.    Appearance: She is well-developed.  HENT:     Head: Normocephalic and atraumatic.     Right Ear: Hearing normal.     Left Ear: Hearing normal.     Nose: Nose normal.     Mouth/Throat:     Mouth: Mucous membranes are moist.     Pharynx: Oropharynx is clear.  Eyes:     General: Lids are normal. No scleral icterus.       Right eye: No discharge.         Left eye: No discharge.     Conjunctiva/sclera: Conjunctivae normal.  Cardiovascular:     Rate and Rhythm: Normal rate and regular rhythm.     Heart sounds: Normal heart sounds.  Pulmonary:     Effort: Pulmonary effort is normal. No respiratory distress.  Abdominal:     General: Bowel sounds are normal.     Palpations: Abdomen is soft.  Musculoskeletal:  General: Normal range of motion.  Skin:    Findings: No lesion or rash.  Neurological:     Mental Status: She is alert and oriented to person, place, and time.  Psychiatric:        Speech: Speech normal.        Behavior: Behavior normal.        Thought Content: Thought content normal.     Depression Screen No flowsheet data found. No results found for any visits on 07/19/20.  Assessment & Plan     1. Encounter to establish care New patient to this practice with major concern of persistent loss of taste, cough and fatigue from recent COVID infection with history of COPD, GERD, thyroid disease, macular degeneration since age 49 and DDD of thoracic spine. Will maintain follow up with specialists.  2. History of COVID-19 Onset with positive test on 06-15-20. Still having some cough, fatigue and malaise. Declined MAB infusion. Still using Breztri 2 puffs BID and Albuterol 2 puffs QID per pulmonologist.   3. H/O partial thyroidectomy- right side.  July 2011 had partial thyroidectomy on the right by Dr. Hassell Done foe nodules. Followed by Dr. Dwyane Dee (endocrinologist).  4. COPD with chronic bronchitis and emphysema ([place]) Followed by pulmonology Wyn Quaker, NP) on 06-29-20.     No follow-ups on file.     I, Desarea Ohagan, PA-C, have reviewed all documentation for this visit. The documentation on 05/18/21 for the exam, diagnosis, procedures, and orders are all accurate and complete.    Vernie Murders, [place] [phone] (phone) [phone] (fax)  [place]

## 2020-08-16 ENCOUNTER — Other Ambulatory Visit: Payer: Self-pay | Admitting: *Deleted

## 2020-08-16 DIAGNOSIS — Z87891 Personal history of nicotine dependence: Secondary | ICD-10-CM

## 2020-08-22 ENCOUNTER — Telehealth: Payer: Self-pay | Admitting: Pulmonary Disease

## 2020-08-22 ENCOUNTER — Ambulatory Visit (INDEPENDENT_AMBULATORY_CARE_PROVIDER_SITE_OTHER): Payer: 59

## 2020-08-22 DIAGNOSIS — Z8616 Personal history of COVID-19: Secondary | ICD-10-CM

## 2020-08-22 MED ORDER — BREZTRI AEROSPHERE 160-9-4.8 MCG/ACT IN AERO: 2.0000 | INHALATION_SPRAY | Freq: Two times a day (BID) | RESPIRATORY_TRACT | 0 refills | Status: DC

## 2020-08-22 NOTE — Telephone Encounter (Addendum)
Spoke with the pt  She is asking for Markus Daft- states that she let the person who called about her cxr know that none of the inhalers that Arlys John told her to ask insurance about are affordable  She states all of the inhalers would be $160  I advised will give one Breztri sample and I gave her pt assistance forms to fill out and return Will forward to Lee to keep an eye out for the forms and document once sent   Thank you

## 2020-09-16 NOTE — Telephone Encounter (Signed)
Megan Harper please advise if you've received patient assistance forms, thanks!

## 2020-09-16 NOTE — Telephone Encounter (Signed)
I have not received assistance forms on Patient at this time.

## 2020-09-20 NOTE — Telephone Encounter (Signed)
Patient assistance still not received at this time.

## 2020-09-22 NOTE — Telephone Encounter (Signed)
Will close encounter until pt returns forms. A new encounter can be opened when pt brings/sends forms in.

## 2020-10-17 ENCOUNTER — Ambulatory Visit: Admission: RE | Admit: 2020-10-17 | Payer: 59 | Source: Ambulatory Visit

## 2020-10-18 ENCOUNTER — Other Ambulatory Visit: Payer: Self-pay

## 2020-10-18 ENCOUNTER — Ambulatory Visit
Admission: RE | Admit: 2020-10-18 | Discharge: 2020-10-18 | Disposition: A | Discharge status: Home / Self Care | Payer: 59 | Source: Ambulatory Visit | Attending: Acute Care | Admitting: Acute Care

## 2020-10-18 DIAGNOSIS — Z87891 Personal history of nicotine dependence: Secondary | ICD-10-CM | POA: Diagnosis not present

## 2020-10-18 DIAGNOSIS — J439 Emphysema, unspecified: Secondary | ICD-10-CM | POA: Insufficient documentation

## 2020-10-18 DIAGNOSIS — I700 Atherosclerosis of aorta: Secondary | ICD-10-CM | POA: Insufficient documentation

## 2020-10-18 DIAGNOSIS — Z122 Encounter for screening for malignant neoplasm of respiratory organs: Secondary | ICD-10-CM | POA: Insufficient documentation

## 2020-10-19 NOTE — Progress Notes (Signed)
Please call patient and let them  know their  low dose Ct was read as a Lung RADS 1, negative study: no nodules or definitely benign nodules. Radiology recommendation is for a repeat LDCT in 12 months. .Please let them  know we will order and schedule their  annual screening scan for 09/2021. Please let them  know there was notation of CAD on their  scan.  Please remind the patient  that this is a non-gated exam therefore degree or severity of disease  cannot be determined. Please have them  follow up with their PCP regarding potential risk factor modification, dietary therapy or pharmacologic therapy if clinically indicated. Pt.  is  currently on statin therapy. Please place order for annual  screening scan for  09/2021 and fax results to PCP. Thanks so much.

## 2020-10-20 ENCOUNTER — Other Ambulatory Visit: Payer: Self-pay | Admitting: *Deleted

## 2020-10-20 DIAGNOSIS — Z87891 Personal history of nicotine dependence: Secondary | ICD-10-CM

## 2020-11-11 ENCOUNTER — Ambulatory Visit: Payer: 59 | Admitting: Adult Health

## 2020-11-16 ENCOUNTER — Encounter: Payer: Self-pay | Admitting: Adult Health

## 2020-11-16 ENCOUNTER — Ambulatory Visit (INDEPENDENT_AMBULATORY_CARE_PROVIDER_SITE_OTHER): Payer: 59 | Admitting: Adult Health

## 2020-11-16 ENCOUNTER — Other Ambulatory Visit: Payer: Self-pay

## 2020-11-16 VITALS — BP 143/67 | HR 76 | Resp 15 | Wt 178.6 lb

## 2020-11-16 DIAGNOSIS — E559 Vitamin D deficiency, unspecified: Secondary | ICD-10-CM

## 2020-11-16 DIAGNOSIS — H6505 Acute serous otitis media, recurrent, left ear: Secondary | ICD-10-CM

## 2020-11-16 DIAGNOSIS — E890 Postprocedural hypothyroidism: Secondary | ICD-10-CM

## 2020-11-16 DIAGNOSIS — N951 Menopausal and female climacteric states: Secondary | ICD-10-CM | POA: Insufficient documentation

## 2020-11-16 DIAGNOSIS — R002 Palpitations: Secondary | ICD-10-CM | POA: Insufficient documentation

## 2020-11-16 DIAGNOSIS — R5383 Other fatigue: Secondary | ICD-10-CM | POA: Diagnosis not present

## 2020-11-16 DIAGNOSIS — E039 Hypothyroidism, unspecified: Secondary | ICD-10-CM | POA: Insufficient documentation

## 2020-11-16 DIAGNOSIS — K219 Gastro-esophageal reflux disease without esophagitis: Secondary | ICD-10-CM | POA: Insufficient documentation

## 2020-11-16 DIAGNOSIS — J302 Other seasonal allergic rhinitis: Secondary | ICD-10-CM

## 2020-11-16 DIAGNOSIS — Z8639 Personal history of other endocrine, nutritional and metabolic disease: Secondary | ICD-10-CM

## 2020-11-16 MED ORDER — LEVOCETIRIZINE DIHYDROCHLORIDE 5 MG PO TABS: 5.0000 mg | ORAL_TABLET | Freq: Every evening | ORAL | 3 refills | Status: DC

## 2020-11-16 MED ORDER — AMOXICILLIN-POT CLAVULANATE 875-125 MG PO TABS: 1.0000 | ORAL_TABLET | Freq: Two times a day (BID) | ORAL | 0 refills | Status: DC

## 2020-11-16 MED ORDER — FLUTICASONE PROPIONATE 50 MCG/ACT NA SUSP: 2.0000 | Freq: Every day | NASAL | 2 refills | Status: DC

## 2020-11-16 NOTE — Patient Instructions (Addendum)
Health Maintenance, Female Adopting a healthy lifestyle and getting preventive care are important in promoting health and wellness. Ask your health care provider about:  The right schedule for you to have regular tests and exams.  Things you can do on your own to prevent diseases and keep yourself healthy. What should I know about diet, weight, and exercise? Eat a healthy diet  Eat a diet that includes plenty of vegetables, fruits, low-fat dairy products, and lean protein.  Do not eat a lot of foods that are high in solid fats, added sugars, or sodium.   Maintain a healthy weight Body mass index (BMI) is used to identify weight problems. It estimates body fat based on height and weight. Your health care provider can help determine your BMI and help you achieve or maintain a healthy weight. Get regular exercise Get regular exercise. This is one of the most important things you can do for your health. Most adults should:  Exercise for at least 150 minutes each week. The exercise should increase your heart rate and make you sweat (moderate-intensity exercise).  Do strengthening exercises at least twice a week. This is in addition to the moderate-intensity exercise.  Spend less time sitting. Even light physical activity can be beneficial. Watch cholesterol and blood lipids Have your blood tested for lipids and cholesterol at 59 years of age, then have this test every 5 years. Have your cholesterol levels checked more often if:  Your lipid or cholesterol levels are high.  You are older than 59 years of age.  You are at high risk for heart disease. What should I know about cancer screening? Depending on your health history and family history, you may need to have cancer screening at various ages. This may include screening for:  Breast cancer.  Cervical cancer.  Colorectal cancer.  Skin cancer.  Lung cancer. What should I know about heart disease, diabetes, and high blood  pressure? Blood pressure and heart disease  High blood pressure causes heart disease and increases the risk of stroke. This is more likely to develop in people who have high blood pressure readings, are of African descent, or are overweight.  Have your blood pressure checked: ? Every 3-5 years if you are 18-39 years of age. ? Every year if you are 40 years old or older. Diabetes Have regular diabetes screenings. This checks your fasting blood sugar level. Have the screening done:  Once every three years after age 40 if you are at a normal weight and have a low risk for diabetes.  More often and at a younger age if you are overweight or have a high risk for diabetes. What should I know about preventing infection? Hepatitis B If you have a higher risk for hepatitis B, you should be screened for this virus. Talk with your health care provider to find out if you are at risk for hepatitis B infection. Hepatitis C Testing is recommended for:  Everyone born from 1945 through 1965.  Anyone with known risk factors for hepatitis C. Sexually transmitted infections (STIs)  Get screened for STIs, including gonorrhea and chlamydia, if: ? You are sexually active and are younger than 59 years of age. ? You are older than 59 years of age and your health care provider tells you that you are at risk for this type of infection. ? Your sexual activity has changed since you were last screened, and you are at increased risk for chlamydia or gonorrhea. Ask your health care provider   if you are at risk.  Ask your health care provider about whether you are at high risk for HIV. Your health care provider may recommend a prescription medicine to help prevent HIV infection. If you choose to take medicine to prevent HIV, you should first get tested for HIV. You should then be tested every 3 months for as long as you are taking the medicine. Pregnancy  If you are about to stop having your period (premenopausal) and  you may become pregnant, seek counseling before you get pregnant.  Take 400 to 800 micrograms (mcg) of folic acid every day if you become pregnant.  Ask for birth control (contraception) if you want to prevent pregnancy. Osteoporosis and menopause Osteoporosis is a disease in which the bones lose minerals and strength with aging. This can result in bone fractures. If you are 36 years old or older, or if you are at risk for osteoporosis and fractures, ask your health care provider if you should:  Be screened for bone loss.  Take a calcium or vitamin D supplement to lower your risk of fractures.  Be given hormone replacement therapy (HRT) to treat symptoms of menopause. Follow these instructions at home: Lifestyle  Do not use any products that contain nicotine or tobacco, such as cigarettes, e-cigarettes, and chewing tobacco. If you need help quitting, ask your health care provider.  Do not use street drugs.  Do not share needles.  Ask your health care provider for help if you need support or information about quitting drugs. Alcohol use  Do not drink alcohol if: ? Your health care provider tells you not to drink. ? You are pregnant, may be pregnant, or are planning to become pregnant.  If you drink alcohol: ? Limit how much you use to 0-1 drink a day. ? Limit intake if you are breastfeeding.  Be aware of how much alcohol is in your drink. In the [place] one drink equals one 12 oz bottle of beer (355 mL), one 5 oz glass of wine (148 mL), or one 1 oz glass of hard liquor (44 mL). General instructions  Schedule regular health, dental, and eye exams.  Stay current with your vaccines.  Tell your health care provider if: ? You often feel depressed. ? You have ever been abused or do not feel safe at home. Summary  Adopting a healthy lifestyle and getting preventive care are important in promoting health and wellness.  Follow your health care provider's instructions about healthy  diet, exercising, and getting tested or screened for diseases.  Follow your health care provider's instructions on monitoring your cholesterol and blood pressure. This information is not intended to replace advice given to you by your health care provider. Make sure you discuss any questions you have with your health care provider. Document Revised: 08/06/2018 Document Reviewed: 08/06/2018 Elsevier Patient Education  2021 [place].  Thyroidectomy A thyroidectomy is a surgery that is done to remove the thyroid gland. The thyroid is a butterfly-shaped gland that is located at the lower front of your neck. It produces thyroid hormone, which is a substance that helps to control certain body processes. You may have a:  Total thyroidectomy. All of your thyroid is removed.  Thyroid lobectomy. Part of your thyroid is removed. The amount of thyroid gland tissue that is removed during your surgery depends on the reason for the procedure. Reasons to have this procedure include treatment for:  Thyroid nodules.  Thyroid cancer.  Benign thyroid tumors.  Goiter.  Overactive thyroid gland (hyperthyroidism). There are two ways to do this procedure. Conventional, or open, thyroidectomy uses one large incision to remove the thyroid gland. This is the most common method. Endoscopic thyroidectomy, a less invasive method, uses a narrow tube with a light and camera (endoscope) to remove the gland. Tell a health care provider about:  Any allergies you have.  All medicines you are taking, including vitamins, herbs, eye drops, creams, and over-the-counter medicines.  Any problems you or family members have had with anesthetic medicines.  Any blood disorders you have.  Any surgeries you have had.  Any medical conditions you have.  Whether you are pregnant or may be pregnant. What are the risks? Generally, this is a safe procedure. However, problems may occur, including:  Damage to the  parathyroid glands. These are located behind your thyroid gland. They maintain the calcium levels in the body. Damage may lead to: ? A decrease in parathyroid hormone levels (hypoparathyroidism). ? A decrease in calcium levels. This will make your nerves irritable and may cause muscle spasms.  An increase in thyroid hormone.  Damage to the nerves of your voice box (larynx). This can be temporary or long-term (rare).  Hoarseness. This usually resolves in 24-48 hours.  Bleeding.  Infection. What happens before the procedure? Staying hydrated Follow instructions from your health care provider about hydration, which may include:  Up to 2 hours before the procedure - you may continue to drink clear liquids, such as water, clear fruit juice, black coffee, and plain tea. Eating and drinking restrictions Follow instructions from your health care provider about eating and drinking, which may include:  8 hours before the procedure - stop eating heavy meals or foods such as meat, fried foods, or fatty foods.  6 hours before the procedure - stop eating light meals or foods, such as toast or cereal.  6 hours before the procedure - stop drinking milk or drinks that contain milk.  2 hours before the procedure - stop drinking clear liquids. Medicines Ask your health care provider about:  Changing or stopping your regular medicines. This is especially important if you are taking diabetes medicines or blood thinners.  Taking medicines such as aspirin and ibuprofen. These medicines can thin your blood. Do not take these medicines unless your health care provider tells you to take them.  Taking over-the-counter medicines, vitamins, herbs, and supplements. General instructions  You may be asked to shower with a germ-killing soap.  Plan to have someone take you home from the hospital or clinic.  Plan to have a responsible adult care for you for at least 24 hours after you leave the hospital or  clinic. This is important. What happens during the procedure?  To reduce your risk of infection: ? Your health care team will wash or sanitize their hands. ? Hair may be removed from the surgical area. ? Your skin will be washed with soap.  An IV will be inserted into one of your veins.  You will be given one or more of the following: ? A medicine to help you relax (sedative). ? A medicine to make you fall asleep (general anesthetic).  Your health care provider will perform your surgery using one of two methods: ? For open thyroidectomy, an incision will be made in your lower neck. Muscles in the area will be separated to reveal your thyroid gland. ? For endoscopic thyroidectomy, several small incisions will be made in your neck, chest, or armpit. An endoscopewill be  inserted into an incision.  Your health care provider may monitor laryngeal nerve function during the procedure for safety reasons.  Part or all of your thyroid gland will be removed.  A tube (drain) may be placed at the incision site to drain blood and fluids that accumulate under the skin after the procedure. The drain may have to stay in place for a day or two after the procedure.  The incision will be closed with stitches (sutures).  A dressing will be placed over your incision. The procedure may vary among health care providers and hospitals. What happens after the procedure?  Your blood pressure, heart rate, breathing rate, and blood oxygen level will be monitored often until the medicines you were given have worn off.  You will be given pain medicine as needed.  Your provider will check your ability to talk and swallow after the procedure.  You will gradually start to drink liquids and have soft foods as tolerated.  You may have a blood test to check the level of calcium in your body.  If you had a drain put in during the procedure, it will usually be removed the next day. Summary  A thyroidectomy is a  surgery that is done to remove the thyroid gland.  The procedure will be done in one of two ways: conventional, or open, thyroidectomy or endoscopic thyroidectomy.  Serious complications are rare.  Plan to have a responsible adult care for you for at least 24 hours after you leave the hospital or clinic. This is important. This information is not intended to replace advice given to you by your health care provider. Make sure you discuss any questions you have with your health care provider. Document Revised: 04/21/2020 Document Reviewed: 04/21/2020 Elsevier Patient Education  [place].

## 2020-11-16 NOTE — Progress Notes (Signed)
Established patient visit   Patient: Megan Harper   DOB: 1961-03-25   60 y.o. Female  MRN: [id number] Visit Date: 11/16/2020  Today's healthcare provider: Marcille Buffy, FNP   Chief Complaint  Patient presents with  . Hypothyroidism    Patient reports that she was previously on Synthroid in 2018 she states that she had surgery to remove right thyroid and was told that medication would no longer be needed because her labs were within normal range. Patient reports today decreased energy, hair loss, brittle nails and intolerance to cold.   . Palpitations    Patient would like to address concerns of heart palpitations for over 6 months, she reports she notices more when laying down at night.  . Nausea    Patient would like to address nausea that has been present for over 3 months, patient states that he wakes up in morning feeling nauseas.    Subjective    HPI HPI    Hypothyroidism     Additional comments: Patient reports that she was previously on Synthroid in 2018 she states that she had surgery to remove right thyroid and was told that medication would no longer be needed because her labs were within normal range. Patient reports today decreased energy, hair loss, brittle nails and intolerance to cold.           Palpitations     Additional comments: Patient would like to address concerns of heart palpitations for over 6 months, she reports she notices more when laying down at night.          Nausea     Additional comments: Patient would like to address nausea that has been present for over 3 months, patient states that he wakes up in morning feeling nauseas.        Last edited by Minette Headland, CMA on 11/16/2020 11:38 AM. (History)      She had thyroid medication stopped 2018 and she saw a different PCP after that. Was told she did not need synthroid at that time. She has lots of hair falling it. She had Physical with [facility] October 2020  and TSH was 4.37. She has had palpitations occasionally. Increased fatigued. Dry skin.   She has seen endocrinology Dr. Dwyane Dee but will need new referral.      Medications: Outpatient Medications Prior to Visit  Medication Sig  . albuterol (PROVENTIL HFA;VENTOLIN HFA) 108 (90 Base) MCG/ACT inhaler Inhale 2 puffs into the lungs every 6 (six) hours as needed for wheezing or shortness of breath.  . [DISCONTINUED] fluticasone (FLONASE) 50 MCG/ACT nasal spray Place 2 sprays into both nostrils daily.  Jearl Klinefelter ELLIPTA 62.5-25 MCG/INH AEPB INHALE 1 PUFF BY MOUTH ONCE DAILY FOR 1 DAY (Patient not taking: No sig reported)  . omeprazole (PRILOSEC) 20 MG capsule Take 1 capsule (20 mg total) by mouth daily. (Patient not taking: Reported on 11/16/2020)  . [DISCONTINUED] ALPRAZolam (XANAX) 0.25 MG tablet alprazolam 0.25 mg tablet (Patient not taking: Reported on 06/29/2020)  . [DISCONTINUED] azithromycin (ZITHROMAX) 250 MG tablet Take by mouth. (Patient not taking: Reported on 07/19/2020)  . [DISCONTINUED] benzonatate (TESSALON) 200 MG capsule Take 1 capsule (200 mg total) by mouth 3 (three) times daily as needed for cough. (Patient not taking: Reported on 07/19/2020)  . [DISCONTINUED] Budeson-Glycopyrrol-Formoterol (BREZTRI AEROSPHERE) 160-9-4.8 MCG/ACT AERO Inhale 2 puffs into the lungs in the morning and at bedtime.  . [DISCONTINUED] Budeson-Glycopyrrol-Formoterol (BREZTRI AEROSPHERE) 160-9-4.8 MCG/ACT AERO Inhale  2 puffs into the lungs 2 (two) times daily.   No facility-administered medications prior to visit.    Review of Systems  Constitutional: Positive for fatigue. Negative for activity change, appetite change, chills, diaphoresis, fever and unexpected weight change.  HENT: Positive for congestion, ear pain, postnasal drip, rhinorrhea and sinus pressure.   Respiratory: Negative.   Cardiovascular: Positive for palpitations. Negative for chest pain and leg swelling.  Gastrointestinal: Negative.    Endocrine: Positive for cold intolerance.       Hair loss.   Genitourinary: Negative.   Musculoskeletal: Negative.   Neurological: Positive for headaches.  Psychiatric/Behavioral: Positive for decreased concentration.       Objective    BP (!) 143/67   Pulse 76   Resp 15   Wt 178 lb 9.6 oz (81 kg)   SpO2 98%   BMI 28.39 kg/m  BP Readings from Last 3 Encounters:  11/16/20 (!) 143/67  07/19/20 (!) 128/52  06/29/20 114/64   Wt Readings from Last 3 Encounters:  11/16/20 178 lb 9.6 oz (81 kg)  10/18/20 178 lb (80.7 kg)  07/19/20 178 lb (80.7 kg)       Physical Exam Vitals reviewed.  Constitutional:      General: She is not in acute distress.    Appearance: She is well-developed. She is not ill-appearing, toxic-appearing or diaphoretic.  HENT:     Head: Normocephalic and atraumatic.     Jaw: There is normal jaw occlusion.      Right Ear: Hearing, ear canal and external ear normal. A middle ear effusion is present. Tympanic membrane is erythematous. Tympanic membrane is not perforated.     Left Ear: Hearing, ear canal and external ear normal. A middle ear effusion is present. Tympanic membrane is erythematous. Tympanic membrane is not perforated.     Ears:     Comments:  Cobblestoning posterior pharynx; bilateral allergic shiners; bilateral TMs air fluid level clear; bilateral nasal turbinates mild edema erythema clear discharge;       Nose: Mucosal edema, congestion and rhinorrhea present.     Right Sinus: Maxillary sinus tenderness present. No frontal sinus tenderness.     Left Sinus: No maxillary sinus tenderness or frontal sinus tenderness.     Mouth/Throat:     Mouth: Mucous membranes are moist.     Pharynx: Uvula midline. Posterior oropharyngeal erythema present. No oropharyngeal exudate.     Tonsils: No tonsillar exudate or tonsillar abscesses. 2+ on the right. 0 on the left.  Eyes:     General: Lids are normal. No scleral icterus.       Right eye: No  discharge.        Left eye: No discharge.     Extraocular Movements: Extraocular movements intact.     Conjunctiva/sclera: Conjunctivae normal.     Pupils: Pupils are equal, round, and reactive to light.  Neck:     Thyroid: No thyroid mass, thyromegaly or thyroid tenderness.     Vascular: No JVD.     Trachea: Trachea normal. No tracheal deviation.     Meningeal: Brudzinski's sign absent.  Cardiovascular:     Rate and Rhythm: Normal rate and regular rhythm.     Heart sounds: Normal heart sounds. No murmur heard. No friction rub. No gallop.   Pulmonary:     Effort: Pulmonary effort is normal. No respiratory distress.     Breath sounds: Normal breath sounds. No stridor. No wheezing or rales.  Chest:     Chest  wall: No tenderness.  Abdominal:     General: Bowel sounds are normal.     Palpations: Abdomen is soft.  Musculoskeletal:        General: Normal range of motion.     Cervical back: Full passive range of motion without pain, normal range of motion and neck supple.  Lymphadenopathy:     Head:     Right side of head: No submental, submandibular, tonsillar, preauricular, posterior auricular or occipital adenopathy.     Left side of head: No submental, submandibular, tonsillar, preauricular, posterior auricular or occipital adenopathy.     Cervical: No cervical adenopathy.     Right cervical: No superficial, deep or posterior cervical adenopathy.    Left cervical: No superficial, deep or posterior cervical adenopathy.  Skin:    General: Skin is warm and dry.     Capillary Refill: Capillary refill takes less than 2 seconds.     Coloration: Skin is not pale.     Findings: No erythema or rash.  Neurological:     Mental Status: She is alert and oriented to person, place, and time.  Psychiatric:        Speech: Speech normal.        Behavior: Behavior normal.        Thought Content: Thought content normal.        Judgment: Judgment normal.       No results found for any visits  on 11/16/20.  Assessment & Plan     Fatigue, unspecified type - Plan: EKG 12-Lead, TSH, CBC w/Diff/Platelet, Comprehensive Metabolic Panel (CMET), G25  History of thyroid nodule- removed right side.   H/O partial thyroidectomy- right side.   Vitamin D insufficiency - Plan: VITAMIN D 25 Hydroxy (Vit-D Deficiency, Fractures)  Recurrent acute serous otitis media of left ear  Seasonal allergies - Plan: levocetirizine (XYZAL) 5 MG tablet, fluticasone (FLONASE) 50 MCG/ACT nasal spray  Meds ordered this encounter  Medications  . amoxicillin-clavulanate (AUGMENTIN) 875-125 MG tablet    Sig: Take 1 tablet by mouth 2 (two) times daily.    Dispense:  20 tablet    Refill:  0  . levocetirizine (XYZAL) 5 MG tablet    Sig: Take 1 tablet (5 mg total) by mouth every evening.    Dispense:  30 tablet    Refill:  3  . fluticasone (FLONASE) 50 MCG/ACT nasal spray    Sig: Place 2 sprays into both nostrils daily.    Dispense:  18.2 mL    Refill:  2    Treat otitis media and start allergy medications.  Will need referral to endocrinology would like to wait  Until follow up after labs.    Red Flags discussed. The patient was given clear instructions to go to ER or return to medical center if any red flags develop, symptoms do not improve, worsen or new problems develop. They verbalized understanding.   Return in about 2 weeks (around 11/30/2020), or if symptoms worsen or fail to improve, for at any time for any worsening symptoms, Go to Emergency room/ urgent care if worse.     The entirety of the information documented in the History of Present Illness, Review of Systems and Physical Exam were personally obtained by me. Portions of this information were initially documented by the CMA and reviewed by me for thoroughness and accuracy.      Marcille Buffy, [place] [phone] (phone) [phone] (fax)  [place]

## 2020-11-17 LAB — CBC WITH DIFFERENTIAL/PLATELET
Basophils Absolute: 0.1 10*3/uL (ref 0.0–0.2)
Basos: 2 %
EOS (ABSOLUTE): 0.1 10*3/uL (ref 0.0–0.4)
Eos: 1 %
Hematocrit: 43.3 % (ref 34.0–46.6)
Hemoglobin: 14.6 g/dL (ref 11.1–15.9)
Immature Grans (Abs): 0 10*3/uL (ref 0.0–0.1)
Immature Granulocytes: 0 %
Lymphocytes Absolute: 1.9 10*3/uL (ref 0.7–3.1)
Lymphs: 28 %
MCH: 32.4 pg (ref 26.6–33.0)
MCHC: 33.7 g/dL (ref 31.5–35.7)
MCV: 96 fL (ref 79–97)
Monocytes Absolute: 0.5 10*3/uL (ref 0.1–0.9)
Monocytes: 7 %
Neutrophils Absolute: 4.2 10*3/uL (ref 1.4–7.0)
Neutrophils: 62 %
Platelets: 312 10*3/uL (ref 150–450)
RBC: 4.51 x10E6/uL (ref 3.77–5.28)
RDW: 11.9 % (ref 11.7–15.4)
WBC: 6.8 10*3/uL (ref 3.4–10.8)

## 2020-11-17 LAB — COMPREHENSIVE METABOLIC PANEL
ALT: 15 IU/L (ref 0–32)
AST: 20 IU/L (ref 0–40)
Albumin/Globulin Ratio: 1.5 (ref 1.2–2.2)
Albumin: 4.6 g/dL (ref 3.8–4.9)
Alkaline Phosphatase: 96 IU/L (ref 44–121)
BUN/Creatinine Ratio: 13 (ref 9–23)
BUN: 10 mg/dL (ref 6–24)
Bilirubin Total: 0.5 mg/dL (ref 0.0–1.2)
CO2: 26 mmol/L (ref 20–29)
Calcium: 9.9 mg/dL (ref 8.7–10.2)
Chloride: 100 mmol/L (ref 96–106)
Creatinine, Ser: 0.76 mg/dL (ref 0.57–1.00)
Globulin, Total: 3 g/dL (ref 1.5–4.5)
Glucose: 108 mg/dL — ABNORMAL HIGH (ref 65–99)
Potassium: 4.1 mmol/L (ref 3.5–5.2)
Sodium: 140 mmol/L (ref 134–144)
Total Protein: 7.6 g/dL (ref 6.0–8.5)
eGFR: 91 mL/min/{1.73_m2} (ref >59)

## 2020-11-17 LAB — TSH: TSH: 3.24 u[IU]/mL (ref 0.450–4.500)

## 2020-11-17 LAB — VITAMIN D 25 HYDROXY (VIT D DEFICIENCY, FRACTURES): Vit D, 25-Hydroxy: 33.2 ng/mL (ref 30.0–100.0)

## 2020-11-17 LAB — VITAMIN B12: Vitamin B-12: 303 pg/mL (ref 232–1245)

## 2020-11-30 ENCOUNTER — Ambulatory Visit: Payer: Self-pay | Admitting: Adult Health

## 2020-12-12 ENCOUNTER — Encounter: Payer: 59 | Admitting: Family Medicine

## 2020-12-14 ENCOUNTER — Ambulatory Visit (INDEPENDENT_AMBULATORY_CARE_PROVIDER_SITE_OTHER): Payer: 59 | Admitting: Adult Health

## 2020-12-14 ENCOUNTER — Encounter: Payer: Self-pay | Admitting: *Deleted

## 2020-12-14 ENCOUNTER — Encounter: Payer: Self-pay | Admitting: Adult Health

## 2020-12-14 ENCOUNTER — Other Ambulatory Visit: Payer: Self-pay

## 2020-12-14 ENCOUNTER — Other Ambulatory Visit (HOSPITAL_COMMUNITY)
Admission: RE | Admit: 2020-12-14 | Discharge: 2020-12-14 | Disposition: A | Discharge status: Home / Self Care | Payer: 59 | Source: Ambulatory Visit | Attending: Adult Health | Admitting: Adult Health

## 2020-12-14 VITALS — BP 122/77 | HR 63 | Temp 97.9°F | Resp 16 | Ht 66.0 in | Wt 179.4 lb

## 2020-12-14 DIAGNOSIS — G8929 Other chronic pain: Secondary | ICD-10-CM

## 2020-12-14 DIAGNOSIS — Z124 Encounter for screening for malignant neoplasm of cervix: Secondary | ICD-10-CM | POA: Diagnosis not present

## 2020-12-14 DIAGNOSIS — E785 Hyperlipidemia, unspecified: Secondary | ICD-10-CM | POA: Insufficient documentation

## 2020-12-14 DIAGNOSIS — L570 Actinic keratosis: Secondary | ICD-10-CM | POA: Insufficient documentation

## 2020-12-14 DIAGNOSIS — Z1211 Encounter for screening for malignant neoplasm of colon: Secondary | ICD-10-CM

## 2020-12-14 DIAGNOSIS — Z23 Encounter for immunization: Secondary | ICD-10-CM | POA: Insufficient documentation

## 2020-12-14 DIAGNOSIS — Z1389 Encounter for screening for other disorder: Secondary | ICD-10-CM | POA: Diagnosis not present

## 2020-12-14 DIAGNOSIS — Z9189 Other specified personal risk factors, not elsewhere classified: Secondary | ICD-10-CM

## 2020-12-14 DIAGNOSIS — Z0000 Encounter for general adult medical examination without abnormal findings: Secondary | ICD-10-CM | POA: Diagnosis not present

## 2020-12-14 DIAGNOSIS — R102 Pelvic and perineal pain: Secondary | ICD-10-CM

## 2020-12-14 DIAGNOSIS — R1022 Pelvic and perineal pain left side: Secondary | ICD-10-CM | POA: Insufficient documentation

## 2020-12-14 DIAGNOSIS — E538 Deficiency of other specified B group vitamins: Secondary | ICD-10-CM

## 2020-12-14 DIAGNOSIS — J4489 Other specified chronic obstructive pulmonary disease: Secondary | ICD-10-CM

## 2020-12-14 DIAGNOSIS — R140 Abdominal distension (gaseous): Secondary | ICD-10-CM

## 2020-12-14 DIAGNOSIS — R1032 Left lower quadrant pain: Secondary | ICD-10-CM | POA: Insufficient documentation

## 2020-12-14 DIAGNOSIS — Z1159 Encounter for screening for other viral diseases: Secondary | ICD-10-CM

## 2020-12-14 DIAGNOSIS — Z1231 Encounter for screening mammogram for malignant neoplasm of breast: Secondary | ICD-10-CM | POA: Diagnosis not present

## 2020-12-14 DIAGNOSIS — E890 Postprocedural hypothyroidism: Secondary | ICD-10-CM

## 2020-12-14 DIAGNOSIS — J449 Chronic obstructive pulmonary disease, unspecified: Secondary | ICD-10-CM

## 2020-12-14 DIAGNOSIS — R5383 Other fatigue: Secondary | ICD-10-CM | POA: Insufficient documentation

## 2020-12-14 DIAGNOSIS — Z8639 Personal history of other endocrine, nutritional and metabolic disease: Secondary | ICD-10-CM

## 2020-12-14 DIAGNOSIS — E559 Vitamin D deficiency, unspecified: Secondary | ICD-10-CM

## 2020-12-14 LAB — POCT URINALYSIS DIPSTICK
Bilirubin, UA: NEGATIVE
Blood, UA: NEGATIVE
Glucose, UA: NEGATIVE
Ketones, UA: NEGATIVE
Leukocytes, UA: NEGATIVE
Nitrite, UA: NEGATIVE
Protein, UA: NEGATIVE
Spec Grav, UA: 1.01 (ref 1.010–1.025)
Urobilinogen, UA: 0.2 E.U./dL
pH, UA: 7 (ref 5.0–8.0)

## 2020-12-14 MED ORDER — OMEPRAZOLE 20 MG PO CPDR: 20.0000 mg | DELAYED_RELEASE_CAPSULE | Freq: Every day | ORAL | 11 refills | Status: DC

## 2020-12-14 MED ORDER — LEVOTHYROXINE SODIUM 50 MCG PO TABS
50.0000 ug | ORAL_TABLET | Freq: Every day | ORAL | 0 refills | Status: DC
Start: 2020-12-14 — End: 2021-03-16

## 2020-12-14 MED ORDER — VITAMIN D (ERGOCALCIFEROL) 1.25 MG (50000 UNIT) PO CAPS: 50000.0000 [IU] | ORAL_CAPSULE | ORAL | 0 refills | Status: DC

## 2020-12-14 MED ORDER — ALPRAZOLAM 0.5 MG PO TABS: 0.5000 mg | ORAL_TABLET | Freq: Two times a day (BID) | ORAL | 0 refills | Status: DC | PRN

## 2020-12-14 NOTE — Progress Notes (Signed)
Complete physical exam   Patient: Megan Harper   DOB: 08-08-1961   60 y.o. Female  MRN: [id number] Visit Date: 12/14/2020  Today's healthcare provider: Marcille Buffy, FNP   Chief Complaint  Patient presents with  . Annual Exam   Subjective     HPI  Megan Harper is a 60 y.o. female who presents today for a complete physical exam.  She reports consuming a general diet. Home exercise routine includes walking her dogs. She generally feels well. She reports sleeping well. She does not have additional problems to discuss today.  Last reported Pap 10/29/16-Normal Last reported colonoscopy 09/27/14 History of covid 6 months ago.   She asks to restart her synthroid she was previously taking, she will start back at synthroid 1/2 tablet 25 mcg daily.  Chronic history of abdominal bloating and LLQ pain. Denies any blood, mucous or tarry stools.Denies constipation or change in bowel habits.   Patient  denies any fever, body aches,chills, rash, chest pain, shortness of breath, nausea, vomiting, or diarrhea.  Denies dizziness, lightheadedness, pre syncopal or syncopal episodes.     Ref Range & Units 6 mo ago  SARS-CoV-2, NAA Not Detected DetectedAbnormal        Past Medical History:  Diagnosis Date  . Anxiety   . Cervical dysplasia   . COPD (chronic obstructive pulmonary disease) ([place])   . Emphysema of lung ([place])   . Endometriosis   . Fibrocystic breast disease   . GERD (gastroesophageal reflux disease)   . Osteoporosis   . Thyroid nodule    Past Surgical History:  Procedure Laterality Date  . laproscopy for endometriosis    . THYROID LOBECTOMY Right 03/08/2010  . TUBAL LIGATION  01/23/1986   Social History   Socioeconomic History  . Marital status: Married    Spouse name: Not on file  . Number of children: 3  . Years of education: Not on file  . Highest education level: Not on file  Occupational History  . Occupation: Armed forces technical officer:  [employer]  Tobacco Use  . Smoking status: Former Smoker    Packs/day: 2.50    Years: 31.00    Pack years: 77.50    Types: Cigarettes    Quit date: 09/17/2009    Years since quitting: 11.2  . Smokeless tobacco: Never Used  Vaping Use  . Vaping Use: Never used  Substance and Sexual Activity  . Alcohol use: No  . Drug use: No  . Sexual activity: Yes    Birth control/protection: Surgical  Other Topics Concern  . Not on file  Social History Narrative  . Not on file   Social Determinants of Health   Financial Resource Strain: Not on file  Food Insecurity: Not on file  Transportation Needs: Not on file  Physical Activity: Not on file  Stress: Not on file  Social Connections: Not on file  Intimate Partner Violence: Not on file   Family Status  Relation Name Status  . PGM  (Not Specified)  . MGF  (Not Specified)  . Mother  (Not Specified)  . Father  (Not Specified)  . Mat Uncle  (Not Specified)  . MGM  (Not Specified)  . Mat Uncle  (Not Specified)  . Daughter  (Not Specified)   Family History  Problem Relation Age of Onset  . Heart disease Paternal Grandmother   . Diabetes Paternal Grandmother   . Hypertension Maternal Grandfather   . Colon  cancer Maternal Grandfather   . Kidney cancer Maternal Grandfather   . Uterine cancer Mother   . Lung cancer Father        smoked  . Kidney cancer Maternal Uncle   . Heart disease Maternal Grandmother   . Hypertension Maternal Grandmother   . Thyroid disease Maternal Grandmother   . Heart disease Maternal Uncle   . Thyroid disease Daughter    Allergies  Allergen Reactions  . Cefaclor Other (See Comments)    Flu like symptoms  . Pantoprazole Itching    Patient Care Team: Chrismon, Vickki Muff, PA-C as PCP - General (Family Medicine)   Medications: Outpatient Medications Prior to Visit  Medication Sig  . albuterol (PROVENTIL HFA;VENTOLIN HFA) 108 (90 Base) MCG/ACT inhaler Inhale 2 puffs into the lungs every 6 (six)  hours as needed for wheezing or shortness of breath.  . fluticasone (FLONASE) 50 MCG/ACT nasal spray Place 2 sprays into both nostrils daily.  Marland Kitchen levocetirizine (XYZAL) 5 MG tablet Take 1 tablet (5 mg total) by mouth every evening.  Jearl Klinefelter ELLIPTA 62.5-25 MCG/INH AEPB INHALE 1 PUFF BY MOUTH ONCE DAILY FOR 1 DAY (Patient not taking: No sig reported)  . [DISCONTINUED] amoxicillin-clavulanate (AUGMENTIN) 875-125 MG tablet Take 1 tablet by mouth 2 (two) times daily.  . [DISCONTINUED] omeprazole (PRILOSEC) 20 MG capsule Take 1 capsule (20 mg total) by mouth daily. (Patient not taking: No sig reported)   No facility-administered medications prior to visit.    Review of Systems  Constitutional: Negative.   HENT: Positive for sinus pressure.   Respiratory: Negative.   Cardiovascular: Negative.  Negative for palpitations.  Gastrointestinal: Positive for nausea. Negative for blood in stool.  Genitourinary: Negative.   Musculoskeletal: Positive for arthralgias, back pain and neck stiffness.  Skin: Negative.   Allergic/Immunologic: Positive for environmental allergies.  Neurological: Negative.   Psychiatric/Behavioral: Positive for dysphoric mood. The patient is nervous/anxious.     Last CBC Lab Results  Component Value Date   WBC 6.8 11/16/2020   HGB 14.6 11/16/2020   HCT 43.3 11/16/2020   MCV 96 11/16/2020   MCH 32.4 11/16/2020   RDW 11.9 11/16/2020   PLT 312 92/17/8375   Last metabolic panel Lab Results  Component Value Date   GLUCOSE 108 (H) 11/16/2020   NA 140 11/16/2020   K 4.1 11/16/2020   CL 100 11/16/2020   CO2 26 11/16/2020   BUN 10 11/16/2020   CREATININE 0.76 11/16/2020   GFRNONAA >60 03/02/2010   GFRAA  03/02/2010    >60        The eGFR has been calculated using the MDRD equation. This calculation has not been validated in all clinical situations. eGFR's persistently <60 mL/min signify possible Chronic Kidney Disease.   CALCIUM 9.9 11/16/2020   PROT 7.6  11/16/2020   ALBUMIN 4.6 11/16/2020   LABGLOB 3.0 11/16/2020   AGRATIO 1.5 11/16/2020   BILITOT 0.5 11/16/2020   ALKPHOS 96 11/16/2020   AST 20 11/16/2020   ALT 15 11/16/2020   Last lipids No results found for: CHOL, HDL, LDLCALC, LDLDIRECT, TRIG, CHOLHDL Last hemoglobin A1c No results found for: HGBA1C Last thyroid functions Lab Results  Component Value Date   TSH 3.240 11/16/2020   Last vitamin D Lab Results  Component Value Date   VD25OH 33.2 11/16/2020   Last vitamin B12 and Folate Lab Results  Component Value Date   VITAMINB12 303 11/16/2020      Objective    BP 122/77  Pulse 63   Temp 97.9 F (36.6 C) (Oral)   Resp 16   Ht 5' 6" (1.676 m)   Wt 179 lb 6.4 oz (81.4 kg)   SpO2 98%   BMI 28.96 kg/m  BP Readings from Last 3 Encounters:  12/14/20 122/77  11/16/20 (!) 143/67  07/19/20 (!) 128/52   Wt Readings from Last 3 Encounters:  12/14/20 179 lb 6.4 oz (81.4 kg)  11/16/20 178 lb 9.6 oz (81 kg)  10/18/20 178 lb (80.7 kg)      Physical Exam Vitals reviewed. Exam conducted with a chaperone present.  Constitutional:      General: She is not in acute distress.    Appearance: She is well-developed. She is not diaphoretic.     Interventions: She is not intubated. HENT:     Head: Normocephalic and atraumatic.     Right Ear: External ear normal.     Left Ear: External ear normal.     Nose: Nose normal.     Mouth/Throat:     Pharynx: No oropharyngeal exudate.  Eyes:     General: Lids are normal. No scleral icterus.       Right eye: No discharge.        Left eye: No discharge.     Conjunctiva/sclera: Conjunctivae normal.     Right eye: Right conjunctiva is not injected. No exudate or hemorrhage.    Left eye: Left conjunctiva is not injected. No exudate or hemorrhage.    Pupils: Pupils are equal, round, and reactive to light.  Neck:     Thyroid: No thyroid mass or thyromegaly.     Vascular: Normal carotid pulses. No carotid bruit, hepatojugular  reflux or JVD.     Trachea: Trachea and phonation normal. No tracheal tenderness or tracheal deviation.     Meningeal: Brudzinski's sign and Kernig's sign absent.  Cardiovascular:     Rate and Rhythm: Normal rate and regular rhythm.     Pulses: Normal pulses.          Radial pulses are 2+ on the right side and 2+ on the left side.       Dorsalis pedis pulses are 2+ on the right side and 2+ on the left side.       Posterior tibial pulses are 2+ on the right side and 2+ on the left side.     Heart sounds: Normal heart sounds, S1 normal and S2 normal. Heart sounds not distant. No murmur heard. No friction rub. No gallop.   Pulmonary:     Effort: Pulmonary effort is normal. No tachypnea, bradypnea, accessory muscle usage or respiratory distress. She is not intubated.     Breath sounds: Normal breath sounds. No stridor. No wheezing, rhonchi or rales.  Chest:     Chest wall: No tenderness.  Breasts:     Right: No supraclavicular adenopathy.     Left: No supraclavicular adenopathy.    Abdominal:     General: Bowel sounds are normal. There is no distension or abdominal bruit.     Palpations: Abdomen is soft. There is no shifting dullness, fluid wave, hepatomegaly, splenomegaly, mass or pulsatile mass.     Tenderness: There is abdominal tenderness. There is no right CVA tenderness, left CVA tenderness, guarding or rebound.     Hernia: No hernia is present.  Genitourinary:    Exam position: Lithotomy position.     Pubic Area: No rash or pubic lice.      Labia:  Right: No rash.        Left: No rash.      Urethra: No prolapse, urethral pain, urethral swelling or urethral lesion.     Vagina: Normal.     Cervix: Normal.     Uterus: Normal.      Adnexa:        Right: No mass, tenderness or fullness.         Left: Tenderness present. No mass or fullness.    Musculoskeletal:        General: No tenderness or deformity. Normal range of motion.     Cervical back: Full passive range of  motion without pain, normal range of motion and neck supple. No edema, erythema or rigidity. No spinous process tenderness or muscular tenderness. Normal range of motion.  Lymphadenopathy:     Head:     Right side of head: No submental, submandibular, tonsillar, preauricular, posterior auricular or occipital adenopathy.     Left side of head: No submental, submandibular, tonsillar, preauricular, posterior auricular or occipital adenopathy.     Cervical: No cervical adenopathy.     Right cervical: No superficial, deep or posterior cervical adenopathy.    Left cervical: No superficial, deep or posterior cervical adenopathy.     Upper Body:     Right upper body: No supraclavicular or pectoral adenopathy.     Left upper body: No supraclavicular or pectoral adenopathy.  Skin:    General: Skin is warm and dry.     Coloration: Skin is not pale.     Findings: No abrasion, bruising, burn, ecchymosis, erythema, lesion, petechiae or rash.     Nails: There is no clubbing.  Neurological:     Mental Status: She is alert and oriented to person, place, and time.     GCS: GCS eye subscore is 4. GCS verbal subscore is 5. GCS motor subscore is 6.     Cranial Nerves: No cranial nerve deficit.     Sensory: No sensory deficit.     Motor: No tremor, atrophy, abnormal muscle tone or seizure activity.     Coordination: Coordination normal.     Gait: Gait normal.     Deep Tendon Reflexes: Reflexes are normal and symmetric. Reflexes normal. Babinski sign absent on the right side. Babinski sign absent on the left side.     Reflex Scores:      Tricep reflexes are 2+ on the right side and 2+ on the left side.      Bicep reflexes are 2+ on the right side and 2+ on the left side.      Brachioradialis reflexes are 2+ on the right side and 2+ on the left side.      Patellar reflexes are 2+ on the right side and 2+ on the left side.      Achilles reflexes are 2+ on the right side and 2+ on the left side. Psychiatric:         Speech: Speech normal.        Behavior: Behavior normal.        Thought Content: Thought content normal.        Judgment: Judgment normal.     Last depression screening scores PHQ 2/9 Scores 07/19/2020  PHQ - 2 Score 0   Last fall risk screening Fall Risk  07/19/2020  Falls in the past year? 0  Number falls in past yr: 0  Injury with Fall? 0   Last Audit-C alcohol use screening Alcohol Use  Disorder Test (AUDIT) 07/19/2020  1. How often do you have a drink containing alcohol? 0  2. How many drinks containing alcohol do you have on a typical day when you are drinking? 0  3. How often do you have six or more drinks on one occasion? 0  AUDIT-C Score 0  Alcohol Brief Interventions/Follow-up AUDIT Score <7 follow-up not indicated   A score of 3 or more in women, and 4 or more in men indicates increased risk for alcohol abuse, EXCEPT if all of the points are from question 1   Results for orders placed or performed in visit on 12/14/20  POCT urinalysis dipstick  Result Value Ref Range   Color, UA yellow    Clarity, UA clear    Glucose, UA Negative Negative   Bilirubin, UA negative    Ketones, UA negative    Spec Grav, UA 1.010 1.010 - 1.025   Blood, UA negative    pH, UA 7.0 5.0 - 8.0   Protein, UA Negative Negative   Urobilinogen, UA 0.2 0.2 or 1.0 E.U./dL   Nitrite, UA negative    Leukocytes, UA Negative Negative   Appearance     Odor      Assessment & Plan    Routine Health Maintenance and Physical Exam  Exercise Activities and Dietary recommendations Goals   None     Immunization History  Administered Date(s) Administered  . Pneumococcal-Unspecified 05/27/2014  . Tdap 12/14/2020    Health Maintenance  Topic Date Due  . Hepatitis C Screening  Never done  . MAMMOGRAM  05/19/2012  . PAP SMEAR-Modifier  10/30/2019  . COVID-19 Vaccine (1) 12/30/2020 (Originally 03/01/1973)  . HIV Screening  12/14/2021 (Originally 03/01/1976)  . INFLUENZA VACCINE  03/27/2021   . COLONOSCOPY (Pts 45-16yrs Insurance coverage will need to be confirmed)  09/27/2024  . TETANUS/TDAP  12/15/2030  . HPV VACCINES  Aged Out    Discussed health benefits of physical activity, and encouraged her to engage in regular exercise appropriate for her age and condition.  Annual physical exam  Screening for cervical cancer - Plan: Cytology - PAP  Encounter for screening mammogram for malignant neoplasm of breast - Plan: MM DIGITAL SCREENING BILATERAL  Screening for colon cancer - Plan: CBC with Differential/Platelet, Comprehensive Metabolic Panel (CMET), Ambulatory referral to Gastroenterology, CANCELED: Ambulatory referral to Gastroenterology  Screening for blood or protein in urine - Plan: POCT urinalysis dipstick  Vitamin D insufficiency - Plan: Vitamin D, Ergocalciferol, (DRISDOL) 1.25 MG (50000 UNIT) CAPS capsule, VITAMIN D 25 Hydroxy (Vit-D Deficiency, Fractures)  History of thyroid nodule - Plan: levothyroxine (SYNTHROID) 50 MCG tablet, TSH  H/O partial thyroidectomy - Plan: levothyroxine (SYNTHROID) 50 MCG tablet, TSH  B12 deficiency - Plan: B12  COPD with chronic bronchitis and emphysema ([place]), Chronic  Need for tetanus booster - Plan: Tdap vaccine greater than or equal to 7yo IM  Need for hepatitis C screening test - Plan: Hepatitis C Antibody  Hyperlipidemia, unspecified hyperlipidemia type - Plan: Lipid Panel w/o Chol/HDL Ratio  Abdominal bloating - Plan: Ambulatory referral to Gastroenterology  Chronic LLQ pain - Plan: Ambulatory referral to Gastroenterology  Actinic keratoses - Plan: Ambulatory referral to Dermatology  Hx of moderate sun exposure - Plan: Ambulatory referral to Dermatology   Orders Placed This Encounter  Procedures  . MM DIGITAL SCREENING BILATERAL  . Tdap vaccine greater than or equal to 7yo IM  . TSH  . VITAMIN D 25 Hydroxy (Vit-D Deficiency, Fractures)  . B12  .  CBC with Differential/Platelet  . Comprehensive Metabolic Panel  (CMET)  . Hepatitis C Antibody  . Lipid Panel w/o Chol/HDL Ratio  . Ambulatory referral to Dermatology  . Ambulatory referral to Gastroenterology  . POCT urinalysis dipstick   Skin check to dermatology.  She will be referred for evaluation by gastroenterology.  Recommend a Ultrasound of pelvis and ovaries/ transvaginal patient declines at this time she did have tenderness of left adnexa at time of pelvic exam.  She desires to see Gastroenterology and let me know after this if she will proceed with ultrasound. Her mother died of uterine cancer at age 32. She denies any abnormal vaginal bleeding or spotting. Risks versus benefits of no ultrasound discussed.  Does not want to see gynecology at this time.   Patient verbalized understanding of all instructions given and denies any further questions at this time.    Red Flags discussed. The patient was given clear instructions to go to ER or return to medical center if any red flags develop, symptoms do not improve, worsen or new problems develop. They verbalized understanding.  Return in about 3 months (around 03/15/2021), or if symptoms worsen or fail to improve, for at any time for any worsening symptoms, Go to Emergency room/ urgent care if worse.    The entirety of the information documented in the History of Present Illness, Review of Systems and Physical Exam were personally obtained by me. Portions of this information were initially documented by the CMA and reviewed by me for thoroughness and accuracy.     Marcille Buffy, [place] [phone] (phone) [phone] (fax)  [place]

## 2020-12-14 NOTE — Patient Instructions (Addendum)
B12 sublingual 2,000 mcg daily.   Call to schedule your screening mammogram. Your orders have been placed for your exam.  Let our office know if you have questions, concerns, or any difficulty scheduling.  If normal results then yearly screening mammograms are recommended unless you notice  Changes in your breast then you should schedule a follow up office visit. If abnormal results  Further imaging will be warranted and sooner follow up as determined by the radiologist at the [facility].   [facility] at [facility] [place], [place] [address]  Main: [phone]     Health Maintenance, Female Adopting a healthy lifestyle and getting preventive care are important in promoting health and wellness. Ask your health care provider about:  The right schedule for you to have regular tests and exams.  Things you can do on your own to prevent diseases and keep yourself healthy. What should I know about diet, weight, and exercise? Eat a healthy diet  Eat a diet that includes plenty of vegetables, fruits, low-fat dairy products, and lean protein.  Do not eat a lot of foods that are high in solid fats, added sugars, or sodium.   Maintain a healthy weight Body mass index (BMI) is used to identify weight problems. It estimates body fat based on height and weight. Your health care provider can help determine your BMI and help you achieve or maintain a healthy weight. Get regular exercise Get regular exercise. This is one of the most important things you can do for your health. Most adults should:  Exercise for at least 150 minutes each week. The exercise should increase your heart rate and make you sweat (moderate-intensity exercise).  Do strengthening exercises at least twice a week. This is in addition to the moderate-intensity exercise.  Spend less time sitting. Even light physical activity can be beneficial. Watch cholesterol and blood lipids Have  your blood tested for lipids and cholesterol at 59 years of age, then have this test every 5 years. Have your cholesterol levels checked more often if:  Your lipid or cholesterol levels are high.  You are older than 59 years of age.  You are at high risk for heart disease. What should I know about cancer screening? Depending on your health history and family history, you may need to have cancer screening at various ages. This may include screening for:  Breast cancer.  Cervical cancer.  Colorectal cancer.  Skin cancer.  Lung cancer. What should I know about heart disease, diabetes, and high blood pressure? Blood pressure and heart disease  High blood pressure causes heart disease and increases the risk of stroke. This is more likely to develop in people who have high blood pressure readings, are of African descent, or are overweight.  Have your blood pressure checked: ? Every 3-5 years if you are 36-21 years of age. ? Every year if you are 74 years old or older. Diabetes Have regular diabetes screenings. This checks your fasting blood sugar level. Have the screening done:  Once every three years after age 46 if you are at a normal weight and have a low risk for diabetes.  More often and at a younger age if you are overweight or have a high risk for diabetes. What should I know about preventing infection? Hepatitis B If you have a higher risk for hepatitis B, you should be screened for this virus. Talk with your health care provider to find out if you are  at risk for hepatitis B infection. Hepatitis C Testing is recommended for:  Everyone born from [birth year] through 1964.  Anyone with known risk factors for hepatitis C. Sexually transmitted infections (STIs)  Get screened for STIs, including gonorrhea and chlamydia, if: ? You are sexually active and are younger than 58 years of age. ? You are older than 59 years of age and your health care provider tells you that you are at  risk for this type of infection. ? Your sexual activity has changed since you were last screened, and you are at increased risk for chlamydia or gonorrhea. Ask your health care provider if you are at risk.  Ask your health care provider about whether you are at high risk for HIV. Your health care provider may recommend a prescription medicine to help prevent HIV infection. If you choose to take medicine to prevent HIV, you should first get tested for HIV. You should then be tested every 3 months for as long as you are taking the medicine. Pregnancy  If you are about to stop having your period (premenopausal) and you may become pregnant, seek counseling before you get pregnant.  Take 400 to 800 micrograms (mcg) of folic acid every day if you become pregnant.  Ask for birth control (contraception) if you want to prevent pregnancy. Osteoporosis and menopause Osteoporosis is a disease in which the bones lose minerals and strength with aging. This can result in bone fractures. If you are 65 years old or older, or if you are at risk for osteoporosis and fractures, ask your health care provider if you should:  Be screened for bone loss.  Take a calcium or vitamin D supplement to lower your risk of fractures.  Be given hormone replacement therapy (HRT) to treat symptoms of menopause. Follow these instructions at home: Lifestyle  Do not use any products that contain nicotine or tobacco, such as cigarettes, e-cigarettes, and chewing tobacco. If you need help quitting, ask your health care provider.  Do not use street drugs.  Do not share needles.  Ask your health care provider for help if you need support or information about quitting drugs. Alcohol use  Do not drink alcohol if: ? Your health care provider tells you not to drink. ? You are pregnant, may be pregnant, or are planning to become pregnant.  If you drink alcohol: ? Limit how much you use to 0-1 drink a day. ? Limit intake if you  are breastfeeding.  Be aware of how much alcohol is in your drink. In the [place] one drink equals one 12 oz bottle of beer (355 mL), one 5 oz glass of wine (148 mL), or one 1 oz glass of hard liquor (44 mL). General instructions  Schedule regular health, dental, and eye exams.  Stay current with your vaccines.  Tell your health care provider if: ? You often feel depressed. ? You have ever been abused or do not feel safe at home. Summary  Adopting a healthy lifestyle and getting preventive care are important in promoting health and wellness.  Follow your health care provider's instructions about healthy diet, exercising, and getting tested or screened for diseases.  Follow your health care provider's instructions on monitoring your cholesterol and blood pressure. This information is not intended to replace advice given to you by your health care provider. Make sure you discuss any questions you have with your health care provider. Document Revised: 08/06/2018 Document Reviewed: 08/06/2018 Elsevier Patient Education  2021 [employer].  Health Maintenance, Female Adopting a healthy lifestyle and getting preventive care are important in promoting health and wellness. Ask your health care provider about:  The right schedule for you to have regular tests and exams.  Things you can do on your own to prevent diseases and keep yourself healthy. What should I know about diet, weight, and exercise? Eat a healthy diet  Eat a diet that includes plenty of vegetables, fruits, low-fat dairy products, and lean protein.  Do not eat a lot of foods that are high in solid fats, added sugars, or sodium.   Maintain a healthy weight Body mass index (BMI) is used to identify weight problems. It estimates body fat based on height and weight. Your health care provider can help determine your BMI and help you achieve or maintain a healthy weight. Get regular exercise Get regular exercise. This is one  of the most important things you can do for your health. Most adults should:  Exercise for at least 150 minutes each week. The exercise should increase your heart rate and make you sweat (moderate-intensity exercise).  Do strengthening exercises at least twice a week. This is in addition to the moderate-intensity exercise.  Spend less time sitting. Even light physical activity can be beneficial. Watch cholesterol and blood lipids Have your blood tested for lipids and cholesterol at 59 years of age, then have this test every 5 years. Have your cholesterol levels checked more often if:  Your lipid or cholesterol levels are high.  You are older than 59 years of age.  You are at high risk for heart disease. What should I know about cancer screening? Depending on your health history and family history, you may need to have cancer screening at various ages. This may include screening for:  Breast cancer.  Cervical cancer.  Colorectal cancer.  Skin cancer.  Lung cancer. What should I know about heart disease, diabetes, and high blood pressure? Blood pressure and heart disease  High blood pressure causes heart disease and increases the risk of stroke. This is more likely to develop in people who have high blood pressure readings, are of African descent, or are overweight.  Have your blood pressure checked: ? Every 3-5 years if you are 96-26 years of age. ? Every year if you are 57 years old or older. Diabetes Have regular diabetes screenings. This checks your fasting blood sugar level. Have the screening done:  Once every three years after age 42 if you are at a normal weight and have a low risk for diabetes.  More often and at a younger age if you are overweight or have a high risk for diabetes. What should I know about preventing infection? Hepatitis B If you have a higher risk for hepatitis B, you should be screened for this virus. Talk with your health care provider to find  out if you are at risk for hepatitis B infection. Hepatitis C Testing is recommended for:  Everyone born from [birth year] through 1964.  Anyone with known risk factors for hepatitis C. Sexually transmitted infections (STIs)  Get screened for STIs, including gonorrhea and chlamydia, if: ? You are sexually active and are younger than 59 years of age. ? You are older than 59 years of age and your health care provider tells you that you are at risk for this type of infection. ? Your sexual activity has changed since you were last screened, and you are at increased risk for chlamydia or gonorrhea. Ask your health care  provider if you are at risk.  Ask your health care provider about whether you are at high risk for HIV. Your health care provider may recommend a prescription medicine to help prevent HIV infection. If you choose to take medicine to prevent HIV, you should first get tested for HIV. You should then be tested every 3 months for as long as you are taking the medicine. Pregnancy  If you are about to stop having your period (premenopausal) and you may become pregnant, seek counseling before you get pregnant.  Take 400 to 800 micrograms (mcg) of folic acid every day if you become pregnant.  Ask for birth control (contraception) if you want to prevent pregnancy. Osteoporosis and menopause Osteoporosis is a disease in which the bones lose minerals and strength with aging. This can result in bone fractures. If you are 63 years old or older, or if you are at risk for osteoporosis and fractures, ask your health care provider if you should:  Be screened for bone loss.  Take a calcium or vitamin D supplement to lower your risk of fractures.  Be given hormone replacement therapy (HRT) to treat symptoms of menopause. Follow these instructions at home: Lifestyle  Do not use any products that contain nicotine or tobacco, such as cigarettes, e-cigarettes, and chewing tobacco. If you need help  quitting, ask your health care provider.  Do not use street drugs.  Do not share needles.  Ask your health care provider for help if you need support or information about quitting drugs. Alcohol use  Do not drink alcohol if: ? Your health care provider tells you not to drink. ? You are pregnant, may be pregnant, or are planning to become pregnant.  If you drink alcohol: ? Limit how much you use to 0-1 drink a day. ? Limit intake if you are breastfeeding.  Be aware of how much alcohol is in your drink. In the [place] one drink equals one 12 oz bottle of beer (355 mL), one 5 oz glass of wine (148 mL), or one 1 oz glass of hard liquor (44 mL). General instructions  Schedule regular health, dental, and eye exams.  Stay current with your vaccines.  Tell your health care provider if: ? You often feel depressed. ? You have ever been abused or do not feel safe at home. Summary  Adopting a healthy lifestyle and getting preventive care are important in promoting health and wellness.  Follow your health care provider's instructions about healthy diet, exercising, and getting tested or screened for diseases.  Follow your health care provider's instructions on monitoring your cholesterol and blood pressure. This information is not intended to replace advice given to you by your health care provider. Make sure you discuss any questions you have with your health care provider. Document Revised: 08/06/2018 Document Reviewed: 08/06/2018 Elsevier Patient Education  2021 [employer].

## 2020-12-15 LAB — COMPREHENSIVE METABOLIC PANEL
ALT: 18 IU/L (ref 0–32)
AST: 16 IU/L (ref 0–40)
Albumin/Globulin Ratio: 1.6 (ref 1.2–2.2)
Albumin: 4.6 g/dL (ref 3.8–4.9)
Alkaline Phosphatase: 89 IU/L (ref 44–121)
BUN/Creatinine Ratio: 13 (ref 9–23)
BUN: 9 mg/dL (ref 6–24)
Bilirubin Total: 0.5 mg/dL (ref 0.0–1.2)
CO2: 25 mmol/L (ref 20–29)
Calcium: 9.4 mg/dL (ref 8.7–10.2)
Chloride: 101 mmol/L (ref 96–106)
Creatinine, Ser: 0.72 mg/dL (ref 0.57–1.00)
Globulin, Total: 2.9 g/dL (ref 1.5–4.5)
Glucose: 86 mg/dL (ref 65–99)
Potassium: 4.4 mmol/L (ref 3.5–5.2)
Sodium: 143 mmol/L (ref 134–144)
Total Protein: 7.5 g/dL (ref 6.0–8.5)
eGFR: 97 mL/min/{1.73_m2} (ref >59)

## 2020-12-15 LAB — LIPID PANEL W/O CHOL/HDL RATIO
Cholesterol, Total: 183 mg/dL (ref 100–199)
HDL: 60 mg/dL (ref >39)
LDL Chol Calc (NIH): 112 mg/dL — ABNORMAL HIGH (ref 0–99)
Triglycerides: 59 mg/dL (ref 0–149)
VLDL Cholesterol Cal: 11 mg/dL (ref 5–40)

## 2020-12-15 LAB — CBC WITH DIFFERENTIAL/PLATELET
Basophils Absolute: 0.1 10*3/uL (ref 0.0–0.2)
Basos: 2 %
EOS (ABSOLUTE): 0.1 10*3/uL (ref 0.0–0.4)
Eos: 1 %
Hematocrit: 41.8 % (ref 34.0–46.6)
Hemoglobin: 14.2 g/dL (ref 11.1–15.9)
Immature Grans (Abs): 0 10*3/uL (ref 0.0–0.1)
Immature Granulocytes: 0 %
Lymphocytes Absolute: 1.4 10*3/uL (ref 0.7–3.1)
Lymphs: 21 %
MCH: 32.5 pg (ref 26.6–33.0)
MCHC: 34 g/dL (ref 31.5–35.7)
MCV: 96 fL (ref 79–97)
Monocytes Absolute: 0.5 10*3/uL (ref 0.1–0.9)
Monocytes: 7 %
Neutrophils Absolute: 4.6 10*3/uL (ref 1.4–7.0)
Neutrophils: 69 %
Platelets: 300 10*3/uL (ref 150–450)
RBC: 4.37 x10E6/uL (ref 3.77–5.28)
RDW: 11.8 % (ref 11.7–15.4)
WBC: 6.7 10*3/uL (ref 3.4–10.8)

## 2020-12-15 LAB — TSH: TSH: 3.57 u[IU]/mL (ref 0.450–4.500)

## 2020-12-15 LAB — VITAMIN B12: Vitamin B-12: 336 pg/mL (ref 232–1245)

## 2020-12-15 LAB — VITAMIN D 25 HYDROXY (VIT D DEFICIENCY, FRACTURES): Vit D, 25-Hydroxy: 29.4 ng/mL — ABNORMAL LOW (ref 30.0–100.0)

## 2020-12-15 LAB — HEPATITIS C ANTIBODY: Hep C Virus Ab: <0.1 s/co ratio (ref 0.0–0.9)

## 2020-12-19 LAB — CYTOLOGY - PAP
Comment: NEGATIVE
Diagnosis: UNDETERMINED — AB
High risk HPV: NEGATIVE

## 2020-12-23 ENCOUNTER — Telehealth: Payer: Self-pay | Admitting: Family Medicine

## 2020-12-23 NOTE — Telephone Encounter (Signed)
Patient called in stated that she needs a new referral because [facility] has moved to another location

## 2020-12-26 ENCOUNTER — Other Ambulatory Visit: Payer: Self-pay | Admitting: Adult Health

## 2020-12-26 DIAGNOSIS — R140 Abdominal distension (gaseous): Secondary | ICD-10-CM

## 2020-12-26 DIAGNOSIS — L570 Actinic keratosis: Secondary | ICD-10-CM

## 2020-12-26 DIAGNOSIS — K219 Gastro-esophageal reflux disease without esophagitis: Secondary | ICD-10-CM

## 2020-12-26 NOTE — Telephone Encounter (Signed)
She still has Fannie Knee PA as PCP and he is at [facility].is she planning on coming here as a new patient to me ?

## 2020-12-26 NOTE — Telephone Encounter (Signed)
Orders Placed This Encounter  Procedures   Ambulatory referral to Dermatology    Referral Priority:   Routine    Referral Type:   Consultation    Referral Reason:   Specialty Services Required    Requested Specialty:   Dermatology    Number of Visits Requested:   1   Ambulatory referral to Gastroenterology    Referral Priority:   Routine    Referral Type:   Consultation    Referral Reason:   Specialty Services Required    Referred to Provider:   Lin Landsman, MD    Number of Visits Requested:   1  Refferals placed again, she should hear with an appointment within 2 weeks and should contact us if she does not.

## 2020-12-26 NOTE — Progress Notes (Signed)
Orders Placed This Encounter  Procedures  . Ambulatory referral to Dermatology    Referral Priority:   Routine    Referral Type:   Consultation    Referral Reason:   Specialty Services Required    Requested Specialty:   Dermatology    Number of Visits Requested:   1  . Ambulatory referral to Gastroenterology    Referral Priority:   Routine    Referral Type:   Consultation    Referral Reason:   Specialty Services Required    Referred to Provider:   Lin Landsman, MD    Number of Visits Requested:   1

## 2020-12-27 ENCOUNTER — Encounter: Payer: Self-pay | Admitting: *Deleted

## 2020-12-27 NOTE — Telephone Encounter (Signed)
Called and spoke to Megan Harper. Megan Harper verbalized understanding and states that she wanted to discuss her old labs from [place] with [name] during her appointment on 03/15/21 and states that she will call us back to schedule for her B12 labs as she wont finish with the b12 prescription before her appointment.

## 2021-01-10 ENCOUNTER — Telehealth: Payer: Self-pay

## 2021-01-10 NOTE — Telephone Encounter (Signed)
Copied from [place] [phone]. Topic: General - Other >> Jan 10, 2021  2:15 PM Keene Breath wrote: Reason for CRM: Patient would like the nurse to call her regarding an incorrect diagnosis that was sent for a previous appt.  Patient stated her insurance would not pay it the way it was submitted.  Please call patient to discuss  at [phone]

## 2021-01-11 ENCOUNTER — Telehealth: Payer: Self-pay | Admitting: Adult Health

## 2021-01-11 NOTE — Telephone Encounter (Signed)
Patient called in about her referral for mammogram

## 2021-01-18 ENCOUNTER — Other Ambulatory Visit: Payer: Self-pay | Admitting: Adult Health

## 2021-01-18 DIAGNOSIS — Z1231 Encounter for screening mammogram for malignant neoplasm of breast: Secondary | ICD-10-CM

## 2021-01-18 NOTE — Telephone Encounter (Signed)
Order placed for mammogram screening at [place] center- ok to change order location to patient preference if needed.  Call to schedule your screening mammogram. Your orders have been placed for your exam.  Let our office know if you have questions, concerns, or any difficulty scheduling.  If normal results then yearly screening mammograms are recommended unless you notice  Changes in your breast then you should schedule a follow up office visit. If abnormal results  Further imaging will be warranted and sooner follow up as determined by the radiologist at the [facility].   [facility] [place], [place] [address]  Main: [phone]

## 2021-01-18 NOTE — Telephone Encounter (Signed)
Called and spoke to [place]. Jailynne verbalized understanding and had no further questions.

## 2021-01-24 ENCOUNTER — Ambulatory Visit
Admission: RE | Admit: 2021-01-24 | Discharge: 2021-01-24 | Disposition: A | Discharge status: Home / Self Care | Payer: 59 | Source: Ambulatory Visit | Attending: Adult Health | Admitting: Adult Health

## 2021-01-24 ENCOUNTER — Other Ambulatory Visit: Payer: Self-pay

## 2021-01-24 DIAGNOSIS — Z1231 Encounter for screening mammogram for malignant neoplasm of breast: Secondary | ICD-10-CM | POA: Insufficient documentation

## 2021-01-25 ENCOUNTER — Telehealth: Payer: Self-pay | Admitting: Adult Health

## 2021-01-25 NOTE — Telephone Encounter (Signed)
If she has poison oak in her eye she needs to be seen immediately, advise UC, she should not wait.

## 2021-01-25 NOTE — Telephone Encounter (Signed)
Called patient to relay provider message. Against medical advice patient refuses to go to UC.

## 2021-01-25 NOTE — Telephone Encounter (Signed)
PT called in to request a appt for having a reaction to poison ivy where she is swelling and itching. She is put on the schedule for tomorrow at 11:00am. She also wanted to see what she can do for now for her eyes.

## 2021-01-25 NOTE — Telephone Encounter (Signed)
Called patient to see if shed prefer to be sooner at an urgent care or walk in. Patient stated she wanted to see doctor because of insurance reasons. Patient states it started Friday and spread from her ear down the back and side and to her arms. She woke up this morning with itchy eyes and wants to know if she can use the allergy eye drops to help relieve it some.

## 2021-01-26 ENCOUNTER — Other Ambulatory Visit: Payer: Self-pay

## 2021-01-26 ENCOUNTER — Other Ambulatory Visit: Payer: Self-pay | Admitting: Adult Health

## 2021-01-26 ENCOUNTER — Encounter: Payer: Self-pay | Admitting: Adult Health

## 2021-01-26 ENCOUNTER — Ambulatory Visit (INDEPENDENT_AMBULATORY_CARE_PROVIDER_SITE_OTHER): Payer: 59 | Admitting: Adult Health

## 2021-01-26 VITALS — BP 118/78 | HR 63 | Temp 97.7°F | Ht 65.98 in | Wt 174.0 lb

## 2021-01-26 DIAGNOSIS — L258 Unspecified contact dermatitis due to other agents: Secondary | ICD-10-CM

## 2021-01-26 DIAGNOSIS — R87619 Unspecified abnormal cytological findings in specimens from cervix uteri: Secondary | ICD-10-CM | POA: Diagnosis not present

## 2021-01-26 DIAGNOSIS — E785 Hyperlipidemia, unspecified: Secondary | ICD-10-CM

## 2021-01-26 DIAGNOSIS — L0390 Cellulitis, unspecified: Secondary | ICD-10-CM | POA: Diagnosis not present

## 2021-01-26 DIAGNOSIS — S30860A Insect bite (nonvenomous) of lower back and pelvis, initial encounter: Secondary | ICD-10-CM | POA: Insufficient documentation

## 2021-01-26 DIAGNOSIS — R928 Other abnormal and inconclusive findings on diagnostic imaging of breast: Secondary | ICD-10-CM

## 2021-01-26 DIAGNOSIS — L299 Pruritus, unspecified: Secondary | ICD-10-CM | POA: Diagnosis not present

## 2021-01-26 DIAGNOSIS — W57XXXA Bitten or stung by nonvenomous insect and other nonvenomous arthropods, initial encounter: Secondary | ICD-10-CM

## 2021-01-26 DIAGNOSIS — N6489 Other specified disorders of breast: Secondary | ICD-10-CM

## 2021-01-26 DIAGNOSIS — L259 Unspecified contact dermatitis, unspecified cause: Secondary | ICD-10-CM | POA: Insufficient documentation

## 2021-01-26 MED ORDER — PREDNISONE 10 MG (21) PO TBPK: ORAL_TABLET | ORAL | 0 refills | Status: DC

## 2021-01-26 MED ORDER — CLINDAMYCIN HCL 300 MG PO CAPS: 300.0000 mg | ORAL_CAPSULE | Freq: Three times a day (TID) | ORAL | 0 refills | Status: DC

## 2021-01-26 MED ORDER — METHYLPREDNISOLONE SODIUM SUCC 40 MG IJ SOLR
40.0000 mg | Freq: Once | INTRAMUSCULAR | Status: AC
  Administered 2021-01-26: 40 mg via INTRAMUSCULAR

## 2021-01-26 MED ORDER — TRIAMCINOLONE ACETONIDE 0.5 % EX OINT: 1.0000 "application " | TOPICAL_OINTMENT | Freq: Two times a day (BID) | CUTANEOUS | 0 refills | Status: DC

## 2021-01-26 NOTE — Progress Notes (Signed)
Mammography should contact patient.  FINDINGS: In the left breast, a possible asymmetry warrants further evaluation. In the right breast, no findings suspicious for malignancy.  IMPRESSION: Further evaluation is suggested for possible asymmetry in the left breast.  RECOMMENDATION: Diagnostic mammogram and possibly ultrasound of the left breast. (Code:[id number])  The patient will be contacted regarding the findings, and additional imaging will be scheduled.  BI-RADS CATEGORY  0: Incomplete. Need additional imaging evaluation and/or prior mammograms for comparison.

## 2021-01-26 NOTE — Patient Instructions (Signed)
Start prednisone pack tomorrow, start antibiotic today.     Clindamycin capsules What is this medicine? CLINDAMYCIN ([place] sin) is a lincosamide antibiotic. It is used to treat certain kinds of bacterial infections. It will not work for colds, flu, or other viral infections. This medicine may be used for other purposes; ask your health care provider or pharmacist if you have questions. COMMON BRAND NAME(S): Cleocin What should I tell my health care provider before I take this medicine? They need to know if you have any of these conditions:  kidney disease  liver disease  stomach problems like colitis  an unusual or allergic reaction to clindamycin, lincomycin, or other medicines, foods, dyes like tartrazine or preservatives  pregnant or trying to get pregnant  breast-feeding How should I use this medicine? Take this medicine by mouth with a full glass of water. Follow the directions on the prescription label. You can take this medicine with food or on an empty stomach. If the medicine upsets your stomach, take it with food. Take your medicine at regular intervals. Do not take your medicine more often than directed. Take all of your medicine as directed even if you think your are better. Do not skip doses or stop your medicine early. Talk to your pediatrician regarding the use of this medicine in children. Special care may be needed. Overdosage: If you think you have taken too much of this medicine contact a poison control center or emergency room at once. NOTE: This medicine is only for you. Do not share this medicine with others. What if I miss a dose? If you miss a dose, take it as soon as you can. If it is almost time for your next dose, take only that dose. Do not take double or extra doses. What may interact with this medicine?  birth control pills  erythromycin  medicines that relax muscles for surgery  rifampin This list may not describe all possible interactions.  Give your health care provider a list of all the medicines, herbs, non-prescription drugs, or dietary supplements you use. Also tell them if you smoke, drink alcohol, or use illegal drugs. Some items may interact with your medicine. What should I watch for while using this medicine? Tell your doctor or health care provider if your symptoms do not start to get better or if they get worse. This medicine may cause serious skin reactions. They can happen weeks to months after starting the medicine. Contact your health care provider right away if you notice fevers or flu-like symptoms with a rash. The rash may be red or purple and then turn into blisters or peeling of the skin. Or, you might notice a red rash with swelling of the face, lips or lymph nodes in your neck or under your arms. Do not treat diarrhea with over the counter products. Contact your doctor if you have diarrhea that lasts more than 2 days or if it is severe and watery. What side effects may I notice from receiving this medicine? Side effects that you should report to your doctor or health care professional as soon as possible:  allergic reactions like skin rash, itching or hives, swelling of the face, lips, or tongue  dark urine  pain on swallowing  rash, fever, and swollen lymph nodes  redness, blistering, peeling or loosening of the skin, including inside the mouth  unusual bleeding or bruising  unusually weak or tired  yellowing of eyes or skin Side effects that usually do not  require medical attention (report to your doctor or health care professional if they continue or are bothersome):  diarrhea  itching in the rectal or genital area  joint pain  nausea, vomiting  stomach pain This list may not describe all possible side effects. Call your doctor for medical advice about side effects. You may report side effects to FDA at 1-800-FDA-1088. Where should I keep my medicine? Keep out of the reach of  children. Store at room temperature between 20 and 25 degrees C (68 and 77 degrees F). Throw away any unused medicine after the expiration date. NOTE: This sheet is a summary. It may not cover all possible information. If you have questions about this medicine, talk to your doctor, pharmacist, or health care provider.  2021 Elsevier/Gold Standard (2018-11-13 12:02:12) Cellulitis, Adult  Cellulitis is a skin infection. The infected area is often warm, red, swollen, and sore. It occurs most often in the arms and lower legs. It is very important to get treated for this condition. What are the causes? This condition is caused by bacteria. The bacteria enter through a break in the skin, such as a cut, burn, insect bite, open sore, or crack. What increases the risk? This condition is more likely to occur in people who:  Have a weak body defense system (immune system).  Have open cuts, burns, bites, or scrapes on the skin.  Are older than 59 years of age.  Have a blood sugar problem (diabetes).  Have a long-lasting (chronic) liver disease (cirrhosis) or kidney disease.  Are very overweight (obese).  Have a skin problem, such as: ? Itchy rash (eczema). ? Slow movement of blood in the veins (venous stasis). ? Fluid buildup below the skin (edema).  Have been treated with high-energy rays (radiation).  Use IV drugs. What are the signs or symptoms? Symptoms of this condition include:  Skin that is: ? Red. ? Streaking. ? Spotting. ? Swollen. ? Sore or painful when you touch it. ? Warm.  A fever.  Chills.  Blisters. How is this diagnosed? This condition is diagnosed based on:  Medical history.  Physical exam.  Blood tests.  Imaging tests. How is this treated? Treatment for this condition may include:  Medicines to treat infections or allergies.  Home care, such as: ? Rest. ? Placing cold or warm cloths (compresses) on the skin.  Hospital care, if the condition is  very bad. Follow these instructions at home: Medicines  Take over-the-counter and prescription medicines only as told by your doctor.  If you were prescribed an antibiotic medicine, take it as told by your doctor. Do not stop taking it even if you start to feel better. General instructions  Drink enough fluid to keep your pee (urine) pale yellow.  Do not touch or rub the infected area.  Raise (elevate) the infected area above the level of your heart while you are sitting or lying down.  Place cold or warm cloths on the area as told by your doctor.  Keep all follow-up visits as told by your doctor. This is important.   Contact a doctor if:  You have a fever.  You do not start to get better after 1-2 days of treatment.  Your bone or joint under the infected area starts to hurt after the skin has healed.  Your infection comes back. This can happen in the same area or another area.  You have a swollen bump in the area.  You have new symptoms.  You  feel ill and have muscle aches and pains. Get help right away if:  Your symptoms get worse.  You feel very sleepy.  You throw up (vomit) or have watery poop (diarrhea) for a long time.  You see red streaks coming from the area.  Your red area gets larger.  Your red area turns dark in color. These symptoms may represent a serious problem that is an emergency. Do not wait to see if the symptoms will go away. Get medical help right away. Call your local emergency services (911 in the [place] Do not drive yourself to the hospital. Summary  Cellulitis is a skin infection. The area is often warm, red, swollen, and sore.  This condition is treated with medicines, rest, and cold and warm cloths.  Take all medicines only as told by your doctor.  Tell your doctor if symptoms do not start to get better after 1-2 days of treatment. This information is not intended to replace advice given to you by your health care provider. Make sure  you discuss any questions you have with your health care provider. Document Revised: 01/02/2018 Document Reviewed: 01/02/2018 Elsevier Patient Education  2021 [place] Dermatitis  Poison oak dermatitis is redness and soreness (inflammation) of the skin caused by chemicals in the leaves of the poison oak plant. You may have very bad itching, swelling, a rash, and blisters. What are the causes? You may get this condition by:  Touching a poison oak plant.  Touching something that has the chemical from the leaves on it. This may include animals or objects that have come in contact with the plant. What increases the risk? You are more likely to get this condition if you:  Go outdoors often in wooded or marshy areas.  Go outdoors without wearing protective clothing, such as closed shoes, long pants, and a long-sleeved shirt. What are the signs or symptoms? Symptoms of this condition include:  Redness of the skin.  Very bad itching.  A rash that often includes bumps and blisters. ? The rash usually appears 48 hours after exposure if you have been exposed before. ? If this is the first time you have been exposed, the rash may not appear until a week after exposure.  Swelling. This may occur if the reaction is very bad. Symptoms often clear up in 1-2 weeks. The first time you develop this condition, symptoms may last 3-4 weeks. How is this treated? This condition may be treated with:  Hydrocortisone creams or calamine lotions to help with itching.  Oatmeal baths to soothe the skin.  Medicines to help reduce itching (antihistamines). If you have a very bad reaction, you may also be given steroid medicines. Follow these instructions at home: Medicines  Take or apply over-the-counter and prescription medicines only as told by your doctor.  Use hydrocortisone creams or calamine lotion as needed to help with itching. General instructions  Do not scratch or rub your  skin.  Put a cold, wet cloth (cold compress) on the affected areas or take baths in cool water. This will help with itching.  Avoid hot baths and showers.  Take oatmeal baths as needed. Use colloidal oatmeal. You can get this at a pharmacy or grocery store. Follow the instructions on the package.  While you have the rash, wash your clothes right after you wear them.  Keep all follow-up visits as told by your doctor. This is important. How is this prevented?  Know what poison oak looks like  so you can avoid it. ? This plant has three leaves with flowering branches on a single stem. ? The leaves are fuzzy. ? The edges of the leaves look like teeth.  If you have touched poison oak, wash your skin with soap and water right away. Be sure to wash under your fingernails.  When hiking or camping, wear long pants, a long-sleeved shirt, tall socks, and hiking boots. You can also use a lotion on your skin that helps to prevent contact with the chemical on the plant.  If you think that your clothes or outdoor gear came in contact with poison oak, rinse them off with a garden hose before you bring them inside your house.  When doing yard work or gardening, wear gloves, long sleeves, long pants, and boots. Wash your garden tools and gloves if they come in contact with poison oak.  If you think that your pet has come into contact with poison oak, wash him or her with pet shampoo and water. Make sure you wear gloves while washing your pet.  Do not burn poison oak plants. This can release the chemical from the plant into the air and may cause a reaction.   Contact a doctor if:  You have open sores in the rash area.  You have more redness, swelling, or pain in the affected area.  You have redness that spreads beyond the rash area.  You have fluid, blood, or pus coming from the affected area.  You have a fever.  You have a rash over a large area of your body.  You have a rash on your eyes,  mouth, or genitals.  Your rash does not improve after a few weeks. Get help right away if:  Your face swells or your eyes swell shut.  You have trouble breathing.  You have trouble swallowing. These symptoms may be an emergency. Do not wait to see if the symptoms will go away. Get medical help right away. Call your local emergency services (911 in the [place] Do not drive yourself to the hospital. Summary  Poison oak dermatitis is redness and soreness of the skin caused by chemicals in the leaves of the poison oak plant.  Symptoms of this condition include redness, very bad itching, a rash, and swelling.  Do not scratch or rub your skin.  Take or apply over-the-counter and prescription medicines only as told by your doctor. This information is not intended to replace advice given to you by your health care provider. Make sure you discuss any questions you have with your health care provider. Document Revised: 12/05/2018 Document Reviewed: 09/12/2018 Elsevier Patient Education  2021 [place]. Prednisolone tablets What is this medicine? PREDNISOLONE (pred [place]) is a corticosteroid. It is commonly used to treat inflammation of the skin, joints, lungs, and other organs. Common conditions treated include asthma, allergies, and arthritis. It is also used for other conditions, such as blood disorders and diseases of the adrenal glands. This medicine may be used for other purposes; ask your health care provider or pharmacist if you have questions. COMMON BRAND NAME(S): Millipred, Millipred DP, Millipred DP 12-Day, Millipred DP 6 Day, Prednoral What should I tell my health care provider before I take this medicine? They need to know if you have any of these conditions:  Cushing's syndrome  diabetes  glaucoma  heart problems or disease  high blood pressure  infection such as herpes, measles, tuberculosis, or chickenpox  kidney disease  liver disease  mental  problems  myasthenia gravis  osteoporosis  seizures  stomach ulcer or intestine disease including colitis and diverticulitis  thyroid problem  an unusual or allergic reaction to lactose, prednisolone, other medicines, foods, dyes, or preservatives  pregnant or trying to get pregnant  breast-feeding How should I use this medicine? Take this medicine by mouth with a glass of water. Follow the directions on the prescription label. Take it with food or milk to avoid stomach upset. If you are taking this medicine once a day, take it in the morning. Do not take more medicine than you are told to take. Do not suddenly stop taking your medicine because you may develop a severe reaction. Your doctor will tell you how much medicine to take. If your doctor wants you to stop the medicine, the dose may be slowly lowered over time to avoid any side effects. Talk to your pediatrician regarding the use of this medicine in children. Special care may be needed. Overdosage: If you think you have taken too much of this medicine contact a poison control center or emergency room at once. NOTE: This medicine is only for you. Do not share this medicine with others. What if I miss a dose? If you miss a dose, take it as soon as you can. If it is almost time for your next dose, take only that dose. Do not take double or extra doses. What may interact with this medicine? Do not take this medicine with any of the following medications:  metyrapone  mifepristone This medicine may also interact with the following medications:  aminoglutethimide  amphotericin B  aspirin and aspirin-like medicines  barbiturates  certain medicines for diabetes, like glipizide or glyburide  cholestyramine  cholinesterase inhibitors  cyclosporine  digoxin  diuretics  ephedrine  female hormones, like estrogens and birth control pills  isoniazid  ketoconazole  NSAIDS, medicines for pain and inflammation, like  ibuprofen or naproxen  phenytoin  rifampin  toxoids  vaccines  warfarin This list may not describe all possible interactions. Give your health care provider a list of all the medicines, herbs, non-prescription drugs, or dietary supplements you use. Also tell them if you smoke, drink alcohol, or use illegal drugs. Some items may interact with your medicine. What should I watch for while using this medicine? Visit your doctor or health care professional for regular checks on your progress. If you are taking this medicine over a prolonged period, carry an identification card with your name and address, the type and dose of your medicine, and your doctor's name and address. This medicine may increase your risk of getting an infection. Tell your doctor or health care professional if you are around anyone with measles or chickenpox, or if you develop sores or blisters that do not heal properly. If you are going to have surgery, tell your doctor or health care professional that you have taken this medicine within the last twelve months. Ask your doctor or health care professional about your diet. You may need to lower the amount of salt you eat. This medicine may increase blood sugar. Ask your healthcare provider if changes in diet or medicines are needed if you have diabetes. What side effects may I notice from receiving this medicine? Side effects that you should report to your doctor or health care professional as soon as possible:  allergic reactions like skin rash, itching or hives, swelling of the face, lips, or tongue  changes in emotions or moods  eye pain   signs  and symptoms of high blood sugar such as being more thirsty or hungry or having to urinate more than normal. You may also feel very tired or have blurry vision.  signs and symptoms of infection like fever or chills; cough; sore throat; pain or trouble passing urine  slow growth in children (if used for longer periods of  time)  swelling of ankles, feet  trouble sleeping  weak bones (if used for longer periods of time) Side effects that usually do not require medical attention (report to your doctor or health care professional if they continue or are bothersome):  nausea  skin problems, acne, thin and shiny skin  upset stomach  weight gain This list may not describe all possible side effects. Call your doctor for medical advice about side effects. You may report side effects to FDA at 1-800-FDA-1088. Where should I keep my medicine? Keep out of the reach of children. Store at room temperature between 15 and 30 degrees C (59 and 86 degrees F). Keep container tightly closed. Throw away any unused medicine after the expiration date. NOTE: This sheet is a summary. It may not cover all possible information. If you have questions about this medicine, talk to your doctor, pharmacist, or health care provider.  2021 Elsevier/Gold Standard (2018-05-15 10:30:56)

## 2021-01-26 NOTE — Progress Notes (Signed)
Acute Office Visit  Subjective:    Patient ID: Megan Harper, female    DOB: 08-Apr-1961, 60 y.o.   MRN: [id number]  Chief Complaint  Patient presents with  . Poison Ivy    Pt has rash on hands, legs, under the eye and the ear.    Rash This is a new problem. The current episode started in the past 7 days (friday night she had been doing yard work and weedeating the day before ). The problem has been gradually worsening since onset. The affected locations include the scalp (left side of face, left ear, right cheek, and inner thighs., bilateral hands ). The rash is characterized by blistering, itchiness and pain. She was exposed to plant contact. Pertinent negatives include no anorexia, congestion, cough, diarrhea, eye pain, facial edema, fatigue, fever, joint pain, nail changes, rhinorrhea, shortness of breath, sore throat or vomiting. Past treatments include anti-itch cream. The treatment provided mild relief.   She reports her dog was in the poison oak as well and came in the house she was with the dog and she feels he helped her spread it all over her body.   Notably she did remove a tick from her lower back one week ago, area is healing. No abdominal pain or headaches, no neck pain.  Patient  denies any fever, body aches,chills,, chest pain, shortness of breath, nausea, vomiting, or diarrhea.  Denies dizziness, lightheadedness, pre syncopal or syncopal episodes.   Past Medical History:  Diagnosis Date  . Anxiety   . Cervical dysplasia   . COPD (chronic obstructive pulmonary disease) ([place])   . Emphysema of lung ([place])   . Endometriosis   . Fibrocystic breast disease   . GERD (gastroesophageal reflux disease)   . Osteoporosis   . Thyroid nodule     Past Surgical History:  Procedure Laterality Date  . laproscopy for endometriosis    . THYROID LOBECTOMY Right 03/08/2010  . TUBAL LIGATION  01/23/1986    Family History  Problem Relation Age of Onset  . Heart disease Paternal  Grandmother   . Diabetes Paternal Grandmother   . Hypertension Maternal Grandfather   . Colon cancer Maternal Grandfather   . Kidney cancer Maternal Grandfather   . Uterine cancer Mother   . Lung cancer Father        smoked  . Kidney cancer Maternal Uncle   . Heart disease Maternal Grandmother   . Hypertension Maternal Grandmother   . Thyroid disease Maternal Grandmother   . Heart disease Maternal Uncle   . Thyroid disease Daughter     Social History   Socioeconomic History  . Marital status: Married    Spouse name: Not on file  . Number of children: 3  . Years of education: Not on file  . Highest education level: Not on file  Occupational History  . 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 Smoking status: Former Smoker    Packs/day: 2.50    Years: 31.00    Pack years: 77.50    Types: Cigarettes    Quit date: 09/17/2009    Years since quitting: 11.3  . Smokeless tobacco: Never Used  Vaping Use  . Vaping Use: Never used  Substance and Sexual Activity  . Alcohol use: No  . Drug use: No  . Sexual activity: Yes    Birth control/protection: Surgical  Other Topics Concern  . Not on file  Social History Narrative  . Not  on file   Social Determinants of Health   Financial Resource Strain: Not on file  Food Insecurity: Not on file  Transportation Needs: Not on file  Physical Activity: Not on file  Stress: Not on file  Social Connections: Not on file  Intimate Partner Violence: Not on file    Outpatient Medications Prior to Visit  Medication Sig Dispense Refill  . albuterol (PROVENTIL HFA;VENTOLIN HFA) 108 (90 Base) MCG/ACT inhaler Inhale 2 puffs into the lungs every 6 (six) hours as needed for wheezing or shortness of breath. 1 Inhaler 2  . ALPRAZolam (XANAX) 0.5 MG tablet Take 1 tablet (0.5 mg total) by mouth 2 (two) times daily as needed for sleep. 60 tablet 0  . fluticasone (FLONASE) 50 MCG/ACT nasal spray Place 2 sprays into  both nostrils daily. 18.2 mL 2  . levocetirizine (XYZAL) 5 MG tablet Take 1 tablet (5 mg total) by mouth every evening. 30 tablet 3  . levothyroxine (SYNTHROID) 50 MCG tablet Take 1 tablet (50 mcg total) by mouth daily before breakfast. 90 tablet 0  . omeprazole (PRILOSEC) 20 MG capsule Take 1 capsule (20 mg total) by mouth daily. 30 capsule 11  . Vitamin D, Ergocalciferol, (DRISDOL) 1.25 MG (50000 UNIT) CAPS capsule Take 1 capsule (50,000 Units total) by mouth every 7 (seven) days. (taking one tablet per week) walk in lab in office 1-2 weeks after completing prescription. 12 capsule 0  . ANORO ELLIPTA 62.5-25 MCG/INH AEPB INHALE 1 PUFF BY MOUTH ONCE DAILY FOR 1 DAY (Patient not taking: No sig reported) 60 each 6   No facility-administered medications prior to visit.    Allergies  Allergen Reactions  . Cefaclor Other (See Comments)    Flu like symptoms  . Pantoprazole Itching    Review of Systems  Constitutional: Negative for fatigue and fever.  HENT: Negative for congestion, rhinorrhea and sore throat.   Eyes: Negative for pain.  Respiratory: Negative for cough and shortness of breath.   Gastrointestinal: Negative for anorexia, diarrhea and vomiting.  Musculoskeletal: Negative for joint pain.  Skin: Positive for rash. Negative for nail changes.       Objective:    Physical Exam Vitals reviewed.  Constitutional:      General: She is not in acute distress.    Appearance: Normal appearance. She is not ill-appearing, toxic-appearing or diaphoretic.  HENT:     Head: Normocephalic.     Jaw: There is normal jaw occlusion.     Right Ear: Tympanic membrane, ear canal and external ear normal. There is no impacted cerumen.     Left Ear: Tympanic membrane and ear canal normal. There is no impacted cerumen.     Ears:      Nose: Nose normal. No congestion or rhinorrhea.     Mouth/Throat:     Mouth: Mucous membranes are moist.     Pharynx: No oropharyngeal exudate or posterior  oropharyngeal erythema.  Eyes:     General: Lids are normal. No scleral icterus.       Right eye: No discharge.        Left eye: No discharge.     Conjunctiva/sclera: Conjunctivae normal.     Pupils: Pupils are equal, round, and reactive to light.  Cardiovascular:     Rate and Rhythm: Normal rate and regular rhythm.     Pulses: Normal pulses.     Heart sounds: Normal heart sounds.  Pulmonary:     Effort: Pulmonary effort is normal.  Breath sounds: Normal breath sounds.  Abdominal:     Palpations: Abdomen is soft.  Musculoskeletal:        General: Normal range of motion.     Cervical back: Full passive range of motion without pain, normal range of motion and neck supple.  Lymphadenopathy:     Cervical: Cervical adenopathy present.     Right cervical: No superficial, deep or posterior cervical adenopathy.    Left cervical: Superficial cervical adenopathy (tender ) present. No deep or posterior cervical adenopathy.  Skin:    General: Skin is dry.     Findings: Erythema and rash present. Rash is crusting and vesicular.          Comments: Erythematous vesicula rash widespread on body as on diagram, some blisters have scabbed over and drying.   Neurological:     Mental Status: She is oriented to person, place, and time.     Gait: Gait normal.  Psychiatric:        Mood and Affect: Mood normal.        Behavior: Behavior normal.        Thought Content: Thought content normal.        Judgment: Judgment normal.     BP 118/78   Pulse 63   Temp 97.7 F (36.5 C)   Ht 5' 5.98" (1.676 m)   Wt 174 lb (78.9 kg)   SpO2 96%   BMI 28.10 kg/m  Wt Readings from Last 3 Encounters:  01/26/21 174 lb (78.9 kg)  12/14/20 179 lb 6.4 oz (81.4 kg)  11/16/20 178 lb 9.6 oz (81 kg)    Health Maintenance Due  Topic Date Due  . Zoster Vaccines- Shingrix (1 of 2) Never done    There are no preventive care reminders to display for this patient.   Lab Results  Component Value Date   TSH  3.570 12/14/2020   Lab Results  Component Value Date   WBC 6.7 12/14/2020   HGB 14.2 12/14/2020   HCT 41.8 12/14/2020   MCV 96 12/14/2020   PLT 300 12/14/2020   Lab Results  Component Value Date   NA 143 12/14/2020   K 4.4 12/14/2020   CO2 25 12/14/2020   GLUCOSE 86 12/14/2020   BUN 9 12/14/2020   CREATININE 0.72 12/14/2020   BILITOT 0.5 12/14/2020   ALKPHOS 89 12/14/2020   AST 16 12/14/2020   ALT 18 12/14/2020   PROT 7.5 12/14/2020   ALBUMIN 4.6 12/14/2020   CALCIUM 9.4 12/14/2020   EGFR 97 12/14/2020   GFR 93.75 07/23/2017   Lab Results  Component Value Date   CHOL 183 12/14/2020   Lab Results  Component Value Date   HDL 60 12/14/2020   Lab Results  Component Value Date   LDLCALC 112 (H) 12/14/2020   Lab Results  Component Value Date   TRIG 59 12/14/2020   No results found for: CHOLHDL No results found for: HGBA1C     Assessment & Plan:   Problem List Items Addressed This Visit      Musculoskeletal and Integument   Cellulitis of skin- left ear lobe  - Primary   Relevant Medications   clindamycin (CLEOCIN) 300 MG capsule   Contact dermatitis   Relevant Medications   predniSONE (STERAPRED UNI-PAK 21 TAB) 10 MG (21) TBPK tablet   Tick bite of lower back     Other   Hyperlipidemia   Itching   Relevant Medications   triamcinolone ointment (KENALOG) 0.5 %  predniSONE (STERAPRED UNI-PAK 21 TAB) 10 MG (21) TBPK tablet   Abnormal cervical Papanicolaou smear   Relevant Orders   Ambulatory referral to Obstetrics / Gynecology     The primary encounter diagnosis was Cellulitis of skin- left ear lobe . Diagnoses of Contact dermatitis due to other agent, unspecified contact dermatitis type- poison oak contact , Itching, Abnormal cervical Papanicolaou smear, unspecified abnormal pap finding, Hyperlipidemia, unspecified hyperlipidemia type, and Tick bite of lower back, initial encounter were also pertinent to this visit.   Cellulitis of left ear lobe,  patient has poison oak/ contact dermatitis left ear lobe with erythema and swelling.  Discussed side effects of clindamycin, doing yogurt/ probiotics and signs of c - difficile. Take with food advised.   Will give Methylprednisolone in office today 40 mg IM and start Prednisone dose pack tomorrow on 01/27/21.   Discussed antihistamine such as Benadryl drowsiness advised and per package instruction. Ok to do zyrtec daytime if needed 10 mg once daily. Calamine lotion/ IVY REST or alternative over the counter treatment topical advised only to use steroid prescription cream if needed for intense itching. She will need to not use on face or genitals.  Meds ordered this encounter  Medications  . clindamycin (CLEOCIN) 300 MG capsule    Sig: Take 1 capsule (300 mg total) by mouth 3 (three) times daily.    Dispense:  30 capsule    Refill:  0  . methylPREDNISolone sodium succinate (SOLU-MEDROL) 40 mg/mL injection 40 mg  . triamcinolone ointment (KENALOG) 0.5 %    Sig: Apply 1 application topically 2 (two) times daily. Apply sparingly if intense itching, not for genitals and not for face    Dispense:  30 g    Refill:  0  . predniSONE (STERAPRED UNI-PAK 21 TAB) 10 MG (21) TBPK tablet    Sig: PO: Take 6 tablets on day 1:Take 5 tablets day 2:Take 4 tablets day 3: Take 3 tablets day 4:Take 2 tablets day five: 5 Take 1 tablet day 6    Dispense:  21 tablet    Refill:  0    Dr. Rosanna Randy had reviewed labs after my transfer to [facility], this provider did not receive the results, but saw today PAP was abnormal, also discussed LDL elevated and diet/ lifestyle. She has a follow up in July. referral to Dr. Marcelline Mates at  [facility]. She should hear within 2 weeks and if she does not should call the office to inform us.   Dx: Screening for cervical cancer   0 Result Notes   1 HM Topic  Component 1 mo ago  High risk HPV Negative   Adequacy Satisfactory for evaluation;  transformation zone component PRESENT.   Diagnosis - Atypical squamous cells of undetermined significance (ASC-US)Abnormal   Comment Normal Reference Range HPV - Negative   Resulting Agency [facility]         Specimen Collected: 12/14/20 10:22        Recheck cellulitis and contact dermatitis as discussed in 4 days. Be seen over the weekend if any symptoms worsening at anytime. Patient verbalized understanding of all instructions given and denies any further questions at this time.    Discussed low cholesterol diet in office.   Return in about 4 days (around 01/30/2021), or if symptoms worsen or fail to improve, for at any time for any worsening symptoms, Go to Emergency room/ urgent care if worse.   Red Flags discussed. The patient  was given clear instructions to go to ER or return to medical center if any red flags develop, symptoms do not improve, worsen or new problems develop. They verbalized understanding.   Marcille Buffy, FNP

## 2021-01-30 ENCOUNTER — Ambulatory Visit: Payer: 59 | Admitting: Adult Health

## 2021-01-31 ENCOUNTER — Ambulatory Visit
Admission: RE | Admit: 2021-01-31 | Discharge: 2021-01-31 | Disposition: A | Discharge status: Home / Self Care | Payer: 59 | Source: Ambulatory Visit | Attending: Adult Health | Admitting: Adult Health

## 2021-01-31 ENCOUNTER — Other Ambulatory Visit: Payer: Self-pay

## 2021-01-31 DIAGNOSIS — R928 Other abnormal and inconclusive findings on diagnostic imaging of breast: Secondary | ICD-10-CM | POA: Insufficient documentation

## 2021-01-31 DIAGNOSIS — N6489 Other specified disorders of breast: Secondary | ICD-10-CM

## 2021-02-01 NOTE — Progress Notes (Signed)
Noted glad she is feeling much better.

## 2021-02-01 NOTE — Progress Notes (Signed)
Called pt. Pt states shes feeling better and her face ears and hands have cleared up . Pt states shes done with prednisone and has 5 days left on Clindamycin. Pt states she doesn't need a follow up and will call us if she has any problems. I advised pt that provider wanted to schedule follow up. Pt states she has an appt with provider in July and is feeling better.

## 2021-02-01 NOTE — Progress Notes (Signed)
IMPRESSION: Resolution of the left breast asymmetry seen on screening mammography. No evidence of malignancy in the left breast on today's imaging.  RECOMMENDATION: Annual screening mammography  I have discussed the findings and recommendations with the patient. If applicable, a reminder letter will be sent to the patient regarding the next appointment.  BI-RADS CATEGORY  2: Benign.   Electronically Signed   By: Dorise Bullion III M.D   On: 01/31/2021 13:47

## 2021-03-08 ENCOUNTER — Encounter: Payer: Self-pay | Admitting: Physician Assistant

## 2021-03-09 ENCOUNTER — Ambulatory Visit: Payer: 59 | Admitting: Internal Medicine

## 2021-03-15 ENCOUNTER — Other Ambulatory Visit (HOSPITAL_COMMUNITY)
Admission: RE | Admit: 2021-03-15 | Discharge: 2021-03-15 | Disposition: A | Discharge status: Home / Self Care | Payer: 59 | Source: Ambulatory Visit | Attending: Adult Health | Admitting: Adult Health

## 2021-03-15 ENCOUNTER — Ambulatory Visit: Payer: 59 | Admitting: Adult Health

## 2021-03-15 ENCOUNTER — Encounter: Payer: Self-pay | Admitting: Internal Medicine

## 2021-03-15 ENCOUNTER — Other Ambulatory Visit: Payer: Self-pay

## 2021-03-15 ENCOUNTER — Ambulatory Visit: Payer: 59 | Admitting: Internal Medicine

## 2021-03-15 VITALS — BP 96/62 | HR 73 | Temp 97.9°F | Ht 65.98 in | Wt 175.8 lb

## 2021-03-15 DIAGNOSIS — N760 Acute vaginitis: Secondary | ICD-10-CM | POA: Insufficient documentation

## 2021-03-15 DIAGNOSIS — E890 Postprocedural hypothyroidism: Secondary | ICD-10-CM | POA: Diagnosis not present

## 2021-03-15 DIAGNOSIS — E538 Deficiency of other specified B group vitamins: Secondary | ICD-10-CM

## 2021-03-15 DIAGNOSIS — M25551 Pain in right hip: Secondary | ICD-10-CM

## 2021-03-15 DIAGNOSIS — E559 Vitamin D deficiency, unspecified: Secondary | ICD-10-CM | POA: Diagnosis not present

## 2021-03-15 DIAGNOSIS — R195 Other fecal abnormalities: Secondary | ICD-10-CM

## 2021-03-15 DIAGNOSIS — R102 Pelvic and perineal pain: Secondary | ICD-10-CM

## 2021-03-15 DIAGNOSIS — M5136 Other intervertebral disc degeneration, lumbar region: Secondary | ICD-10-CM

## 2021-03-15 DIAGNOSIS — R1030 Lower abdominal pain, unspecified: Secondary | ICD-10-CM

## 2021-03-15 DIAGNOSIS — R87610 Atypical squamous cells of undetermined significance on cytologic smear of cervix (ASC-US): Secondary | ICD-10-CM

## 2021-03-15 DIAGNOSIS — M549 Dorsalgia, unspecified: Secondary | ICD-10-CM

## 2021-03-15 DIAGNOSIS — R109 Unspecified abdominal pain: Secondary | ICD-10-CM | POA: Insufficient documentation

## 2021-03-15 DIAGNOSIS — B351 Tinea unguium: Secondary | ICD-10-CM

## 2021-03-15 DIAGNOSIS — M542 Cervicalgia: Secondary | ICD-10-CM

## 2021-03-15 DIAGNOSIS — L299 Pruritus, unspecified: Secondary | ICD-10-CM

## 2021-03-15 DIAGNOSIS — W57XXXD Bitten or stung by nonvenomous insect and other nonvenomous arthropods, subsequent encounter: Secondary | ICD-10-CM

## 2021-03-15 DIAGNOSIS — S30860D Insect bite (nonvenomous) of lower back and pelvis, subsequent encounter: Secondary | ICD-10-CM

## 2021-03-15 DIAGNOSIS — M25512 Pain in left shoulder: Secondary | ICD-10-CM

## 2021-03-15 DIAGNOSIS — M51369 Other intervertebral disc degeneration, lumbar region without mention of lumbar back pain or lower extremity pain: Secondary | ICD-10-CM

## 2021-03-15 MED ORDER — HYDROCORTISONE-ACETIC ACID 1-2 % OT SOLN: 3.0000 [drp] | Freq: Three times a day (TID) | OTIC | 0 refills | Status: DC

## 2021-03-15 MED ORDER — CICLOPIROX 8 % EX SOLN: Freq: Every day | CUTANEOUS | 2 refills | Status: DC

## 2021-03-15 NOTE — Progress Notes (Signed)
Chief Complaint  Patient presents with   Follow-up   F/u  1. H/o multiple tick bites on back noted 11/2020 wants to be tested tick borne illness  2. Wants f/u b12, vitamin D on 50K IU weekly and B12 2000 IU qd otc  3. C/o lower ab cramping/pain/pelvic pressure  2/10 after taking clindamycin 02/06/21 started 6/2 for left ear cellulitis after poison ivy/[place] since then also had mucous in stools and loose stools appt [facility] 04/12/21  Declines c diff testing for now and bentyl  4. Left great toe h/o trauma with white yellow changes and wants to try topical medication for this hold on podiatry for now  5. C/o itching ears and wants rx acetic acid hc no wax in ears  6. Hypothyroidism on levo 50 mcg qd  7. [facility] ob/gyn Dr. Salome Arnt ob/gyn agreeable to f/u and sent ob/gyn message about ascus pap and pt needs f/u  8. Wants referral Dr. Erasmo [facility] in [place] right hip pain, left shoulder pain, neck , mid and low back pain  since 1998 ddd h/o abnormal imaging  Nothing tried pain intermittent and chronic   Review of Systems  Constitutional:  Negative for weight loss.  HENT:  Negative for hearing loss.        Ears itching  Eyes:  Negative for blurred vision.  Cardiovascular:  Negative for chest pain.  Gastrointestinal:  Positive for abdominal pain and diarrhea.  Musculoskeletal:  Positive for back pain, joint pain and neck pain.  Skin:  Negative for rash.  Past Medical History:  Diagnosis Date   Anxiety    Cervical dysplasia    COPD (chronic obstructive pulmonary disease) ([facility])    Emphysema of lung ([facility])    Endometriosis    Fibrocystic breast disease    GERD (gastroesophageal reflux disease)    Osteoporosis    Thyroid nodule    Past Surgical History:  Procedure Laterality Date   laproscopy for endometriosis     THYROID LOBECTOMY Right 03/08/2010   TUBAL LIGATION  01/23/1986   Family History  Problem Relation Age of Onset   Heart disease Paternal Grandmother     Diabetes Paternal Grandmother    Hypertension Maternal Grandfather    Colon cancer Maternal Grandfather    Kidney cancer Maternal Grandfather    Uterine cancer Mother    Lung cancer Father        smoked   Kidney cancer Maternal Uncle    Heart disease Maternal Grandmother    Hypertension Maternal Grandmother    Thyroid disease Maternal Grandmother    Heart disease Maternal Uncle    Thyroid disease Daughter    Social History   Socioeconomic History   Marital status: Married    Spouse name: Not on file   Number of children: 3   Years of education: Not on file   Highest education level: Not on file  Occupational History   Occupation: customer advocate    Employer: [employer]  Tobacco Use   Smoking status: Former    Packs/day: 2.50    Years: 31.00    Pack years: 77.50    Types: Cigarettes    Quit date: 09/17/2009    Years since quitting: 11.4   Smokeless tobacco: Never  Vaping Use   Vaping Use: Never used  Substance and Sexual Activity   Alcohol use: No   Drug use: No   Sexual activity: Yes    Birth control/protection: Surgical  Other Topics Concern  Not on file  Social History Narrative   Not on file   Social Determinants of Health   Financial Resource Strain: Not on file  Food Insecurity: Not on file  Transportation Needs: Not on file  Physical Activity: Not on file  Stress: Not on file  Social Connections: Not on file  Intimate Partner Violence: Not on file   Current Meds  Medication Sig   acetic acid-hydrocortisone (VOSOL-HC) OTIC solution Place 3 drops into both ears 3 (three) times daily. Prn x 4-7 days   ALPRAZolam (XANAX) 0.5 MG tablet Take 1 tablet (0.5 mg total) by mouth 2 (two) times daily as needed for sleep.   ciclopirox (PENLAC) 8 % solution Apply topically at bedtime. Apply over nail and surrounding skin. Apply daily over previous coat. After seven (7) days, may remove with alcohol and continue cycle.   fluticasone (FLONASE) 50 MCG/ACT  nasal spray Place 2 sprays into both nostrils daily.   levothyroxine (SYNTHROID) 50 MCG tablet Take 1 tablet (50 mcg total) by mouth daily before breakfast.   omeprazole (PRILOSEC) 20 MG capsule Take 1 capsule (20 mg total) by mouth daily.   Allergies  Allergen Reactions   Cefaclor Other (See Comments)    Flu like symptoms   Pantoprazole Itching   No results found for this or any previous visit (from the past 2160 hour(s)). Objective  Body mass index is 28.39 kg/m. Wt Readings from Last 3 Encounters:  03/15/21 175 lb 12.8 oz (79.7 kg)  01/26/21 174 lb (78.9 kg)  12/14/20 179 lb 6.4 oz (81.4 kg)   Temp Readings from Last 3 Encounters:  03/15/21 97.9 F (36.6 C) (Oral)  01/26/21 97.7 F (36.5 C)  12/14/20 97.9 F (36.6 C) (Oral)   BP Readings from Last 3 Encounters:  03/15/21 96/62  01/26/21 118/78  12/14/20 122/77   Pulse Readings from Last 3 Encounters:  03/15/21 73  01/26/21 63  12/14/20 63    Physical Exam Vitals and nursing note reviewed.  Constitutional:      Appearance: Normal appearance. She is well-developed and well-groomed.  HENT:     Head: Normocephalic and atraumatic.  Eyes:     Conjunctiva/sclera: Conjunctivae normal.     Pupils: Pupils are equal, round, and reactive to light.  Cardiovascular:     Rate and Rhythm: Normal rate and regular rhythm.     Heart sounds: Normal heart sounds. No murmur heard. Abdominal:     General: Abdomen is flat. Bowel sounds are normal.     Tenderness: There is abdominal tenderness in the right lower quadrant, suprapubic area and left lower quadrant.  Skin:    General: Skin is warm and moist.  Neurological:     General: No focal deficit present.     Mental Status: She is alert and oriented to person, place, and time. Mental status is at baseline.     Gait: Gait normal.  Psychiatric:        Attention and Perception: Attention and perception normal.        Mood and Affect: Mood and affect normal.        Speech:  Speech normal.        Behavior: Behavior normal. Behavior is cooperative.        Thought Content: Thought content normal.        Cognition and Memory: Cognition and memory normal.        Judgment: Judgment normal.    Assessment  Plan  Tick bite of lower back,  subsequent encounter - Plan: [place] mtn spotted fvr abs pnl(IgG+IgM), Lyme Disease Serology w/Reflex  Vitamin D deficiency - Plan: Vitamin D (25 hydroxy) On 50K weekly   B12 deficiency - Plan: Vitamin B12 on 2000 daily  Postoperative hypothyroidism - Plan: TSH will need refill of levo 50 mcg daily   Loose stools since clindamycin 01/27/21-02/06/21 for cellulitis Probiotic I.e culturelle, align Avoid dairy for now   Consider bentyl in the future abdominal cramping or follow with GI as scheduled 04/12/21  Call back if you want to do stool sample C diff   Abdominal cramping and lower pelvic pressure pain- Plan: Urine cytology ancillary only([place]), Urine Culture Acute vaginitis - Plan: Urine cytology ancillary only([place])  DDD (degenerative disc disease), lumbar - Plan: Ambulatory referral to Orthopedic Surgery Dr .Erasmo [facility] in [place] Right hip pain - plan: Ambulatory referral to Orthopedic Surgery Acute pain of left shoulder - Plan: Ambulatory referral to Orthopedic Surgery Mid back pain - Plan: Ambulatory referral to Orthopedic Surgery Cervicalgia - Plan: Ambulatory referral to Orthopedic Surgery  Pelvic pressure in female - Plan: Urine Culture Lower abdominal pain - Plan: Urine Culture  Onychomycosis - Plan: ciclopirox (PENLAC) 8 % solution Consider podiatry if not better  Ear itching - Plan: acetic acid-hydrocortisone (VOSOL-HC) OTIC solution Consider ent if not better  ASCUS of cervix with negative high risk HPV 11/2020  Sent ob/gyn Esmond Plants ob/gyn a message to sch f/u for pt  She is established   Provider: Dr. Olivia Mackie McLean-[facility]oc[facility]

## 2021-03-15 NOTE — Progress Notes (Signed)
Patient having abdominal cramping after taking Clindamycin 300 mg for 10 days for a ear infection.

## 2021-03-15 NOTE — Patient Instructions (Addendum)
Probiotic I.e culturelle, align Avoid dairy for now   Consider bentyl in the future abdominal cramping or follow with GI as scheduled  Call back if you want to do stool sample     Bland Diet A bland diet consists of foods that are often soft and do not have a lot of fat, fiber, or extra seasonings. Foods without fat, fiber, or seasoning are easier for the body to digest. They are also less likely to irritate your mouth, throat, stomach, and other parts of your digestive system. A bland dietis sometimes called a BRAT diet. What is my plan? Your health care provider or food and nutrition specialist (dietitian) may recommend specific changes to your diet to prevent symptoms or to treat your symptoms. These changes may include: Eating small meals often. Cooking food until it is soft enough to chew easily. Chewing your food well. Drinking fluids slowly. Not eating foods that are very spicy, sour, or fatty. Not eating citrus fruits, such as oranges and grapefruit. What do I need to know about this diet? Eat a variety of foods from the bland diet food list. Do not follow a bland diet longer than needed. Ask your health care provider whether you should take vitamins or supplements. What foods can I eat? Grains  Hot cereals, such as cream of wheat. Rice. Bread, crackers, or tortillas madefrom refined white flour. Vegetables Canned or cooked vegetables. Mashed or boiled potatoes. Fruits  Bananas. Applesauce. Other types of cooked or canned fruit with the skin andseeds removed, such as canned peaches or pears. Meats and other proteins  Scrambled eggs. Creamy peanut butter or other nut butters. Lean, well-cookedmeats, such as chicken or fish. Tofu. Soups or broths. Dairy Low-fat dairy products, such as milk, cottage cheese, or yogurt. Beverages  Water. Herbal tea. Apple juice. Fats and oils Mild salad dressings. [place] or olive oil. Sweets and desserts Pudding. Custard. Fruit gelatin.  Ice cream. The items listed above may not be a complete list of recommended foods and beverages. Contact a dietitian for more options. What foods are not recommended? Grains Whole grain breads and cereals. Vegetables Raw vegetables. Fruits Raw fruits, especially citrus, berries, or dried fruits. Dairy Whole fat dairy foods. Beverages Caffeinated drinks. Alcohol. Seasonings and condiments Strongly flavored seasonings or condiments. Hot sauce. Salsa. Other foods Spicy foods. Fried foods. Sour foods, such as pickled or fermented foods. Foodswith high sugar content. Foods high in fiber. The items listed above may not be a complete list of foods and beverages to avoid. Contact a dietitian for more information. Summary A bland diet consists of foods that are often soft and do not have a lot of fat, fiber, or extra seasonings. Foods without fat, fiber, or seasoning are easier for the body to digest. Check with your health care provider to see how long you should follow this diet plan. It is not meant to be followed for long periods. This information is not intended to replace advice given to you by your health care provider. Make sure you discuss any questions you have with your healthcare provider. Document Revised: 09/11/2017 Document Reviewed: 09/11/2017 Elsevier Patient Education  [place].  Diarrhea, Adult Diarrhea is frequent loose and watery bowel movements. Diarrhea can make you feel weak and cause you to become dehydrated. Dehydration can make you tiredand thirsty, cause you to have a dry mouth, and decrease how often you urinate. Diarrhea typically lasts 2-3 days. However, it can last longer if it is a sign of  something more serious. It is important to treat your diarrhea as told byyour health care provider. Follow these instructions at home: Eating and drinking     Follow these recommendations as told by your health care provider: Take an oral rehydration solution  (ORS). This is an over-the-counter medicine that helps return your body to its normal balance of nutrients and water. It is found at pharmacies and retail stores. Drink plenty of fluids, such as water, ice chips, diluted fruit juice, and low-calorie sports drinks. You can drink milk also, if desired. Avoid drinking fluids that contain a lot of sugar or caffeine, such as energy drinks, sports drinks, and soda. Eat bland, easy-to-digest foods in small amounts as you are able. These foods include bananas, applesauce, rice, lean meats, toast, and crackers. Avoid alcohol. Avoid spicy or fatty foods.  Medicines Take over-the-counter and prescription medicines only as told by your health care provider. If you were prescribed an antibiotic medicine, take it as told by your health care provider. Do not stop using the antibiotic even if you start to feel better. General instructions  Wash your hands often using soap and water. If soap and water are not available, use a hand sanitizer. Others in the household should wash their hands as well. Hands should be washed: After using the toilet or changing a diaper. Before preparing, cooking, or serving food. While caring for a sick person or while visiting someone in a hospital. Drink enough fluid to keep your urine pale yellow. Rest at home while you recover. Watch your condition for any changes. Take a warm bath to relieve any burning or pain from frequent diarrhea episodes. Keep all follow-up visits as told by your health care provider. This is important.  Contact a health care provider if: You have a fever. Your diarrhea gets worse. You have new symptoms. You cannot keep fluids down. You feel light-headed or dizzy. You have a headache. You have muscle cramps. Get help right away if: You have chest pain. You feel extremely weak or you faint. You have bloody or black stools or stools that look like tar. You have severe pain, cramping, or bloating  in your abdomen. You have trouble breathing or you are breathing very quickly. Your heart is beating very quickly. Your skin feels cold and clammy. You feel confused. You have signs of dehydration, such as: Dark urine, very little urine, or no urine. Cracked lips. Dry mouth. Sunken eyes. Sleepiness. Weakness. Summary Diarrhea is frequent loose and watery bowel movements. Diarrhea can make you feel weak and cause you to become dehydrated. Drink enough fluids to keep your urine pale yellow. Make sure that you wash your hands after using the toilet. If soap and water are not available, use hand sanitizer. Contact a health care provider if your diarrhea gets worse or you have new symptoms. Get help right away if you have signs of dehydration. This information is not intended to replace advice given to you by your health care provider. Make sure you discuss any questions you have with your healthcare provider. Document Revised: 12/30/2018 Document Reviewed: 01/17/2018 Elsevier Patient Education  [place].

## 2021-03-16 ENCOUNTER — Other Ambulatory Visit: Payer: Self-pay | Admitting: Internal Medicine

## 2021-03-16 DIAGNOSIS — Z8639 Personal history of other endocrine, nutritional and metabolic disease: Secondary | ICD-10-CM

## 2021-03-16 DIAGNOSIS — E890 Postprocedural hypothyroidism: Secondary | ICD-10-CM

## 2021-03-16 LAB — TSH: TSH: 1.2 u[IU]/mL (ref 0.35–5.50)

## 2021-03-16 LAB — VITAMIN D 25 HYDROXY (VIT D DEFICIENCY, FRACTURES): VITD: 53.02 ng/mL (ref 30.00–100.00)

## 2021-03-16 LAB — ROCKY MTN SPOTTED FVR ABS PNL(IGG+IGM)
RMSF IgG: NOT DETECTED
RMSF IgM: NOT DETECTED

## 2021-03-16 LAB — VITAMIN B12: Vitamin B-12: 546 pg/mL (ref 211–911)

## 2021-03-16 MED ORDER — LEVOTHYROXINE SODIUM 50 MCG PO TABS: 50.0000 ug | ORAL_TABLET | Freq: Every day | ORAL | 3 refills | Status: DC

## 2021-03-17 LAB — URINE CULTURE
MICRO NUMBER:: 12141993
Result:: NO GROWTH
SPECIMEN QUALITY:: ADEQUATE

## 2021-03-17 LAB — URINE CYTOLOGY ANCILLARY ONLY
Bacterial Vaginitis-Urine: NEGATIVE
Candida Urine: NEGATIVE

## 2021-03-17 LAB — LYME DISEASE SEROLOGY W/REFLEX: Lyme Total Antibody EIA: NEGATIVE

## 2021-04-03 ENCOUNTER — Telehealth: Payer: Self-pay

## 2021-04-03 NOTE — Telephone Encounter (Signed)
LMTCB in regards to lab results.  

## 2021-04-04 DIAGNOSIS — M545 Low back pain, unspecified: Secondary | ICD-10-CM | POA: Insufficient documentation

## 2021-04-05 ENCOUNTER — Telehealth: Payer: Self-pay | Admitting: Adult Health

## 2021-04-05 NOTE — Telephone Encounter (Signed)
Pt needs a copy of her last PAP and the report faxed to [facility] @ [phone]

## 2021-04-05 NOTE — Telephone Encounter (Signed)
PAP cytology report faxed as requested.

## 2021-04-06 ENCOUNTER — Ambulatory Visit: Payer: 59 | Admitting: Nurse Practitioner

## 2021-04-12 ENCOUNTER — Ambulatory Visit: Payer: 59 | Admitting: Physician Assistant

## 2021-05-18 ENCOUNTER — Encounter: Payer: Self-pay | Admitting: Family Medicine

## 2021-06-05 ENCOUNTER — Ambulatory Visit: Payer: 59 | Admitting: Dermatology

## 2021-07-12 ENCOUNTER — Encounter: Payer: Self-pay | Admitting: Adult Health

## 2021-07-12 ENCOUNTER — Other Ambulatory Visit: Payer: Self-pay

## 2021-07-12 ENCOUNTER — Ambulatory Visit (INDEPENDENT_AMBULATORY_CARE_PROVIDER_SITE_OTHER): Payer: 59 | Admitting: Adult Health

## 2021-07-12 ENCOUNTER — Ambulatory Visit (INDEPENDENT_AMBULATORY_CARE_PROVIDER_SITE_OTHER): Payer: 59

## 2021-07-12 VITALS — BP 122/84 | HR 71 | Temp 96.5°F | Ht 65.98 in | Wt 179.4 lb

## 2021-07-12 DIAGNOSIS — M549 Dorsalgia, unspecified: Secondary | ICD-10-CM

## 2021-07-12 DIAGNOSIS — M25552 Pain in left hip: Secondary | ICD-10-CM

## 2021-07-12 DIAGNOSIS — M25551 Pain in right hip: Secondary | ICD-10-CM

## 2021-07-12 DIAGNOSIS — Z8639 Personal history of other endocrine, nutritional and metabolic disease: Secondary | ICD-10-CM

## 2021-07-12 DIAGNOSIS — M5136 Other intervertebral disc degeneration, lumbar region: Secondary | ICD-10-CM | POA: Diagnosis not present

## 2021-07-12 DIAGNOSIS — M2550 Pain in unspecified joint: Secondary | ICD-10-CM

## 2021-07-12 MED ORDER — MELOXICAM 7.5 MG PO TABS: 7.5000 mg | ORAL_TABLET | Freq: Every day | ORAL | 0 refills | Status: DC

## 2021-07-12 NOTE — Patient Instructions (Addendum)
Meloxicam Tablets What is this medication? MELOXICAM (mel OX i cam) treats mild to moderate pain, inflammation, or arthritis. It works by decreasing inflammation. It belongs to a group of medications called NSAIDs. This medicine may be used for other purposes; ask your health care provider or pharmacist if you have questions. COMMON BRAND NAME(S): Mobic What should I tell my care team before I take this medication? They need to know if you have any of these conditions: Asthma (lung or breathing disease) Bleeding disorder Coronary artery bypass graft (CABG) within the past 2 weeks Dehydration Heart attack Heart disease Heart failure High blood pressure If you often drink alcohol Kidney disease Liver disease Smoke tobacco cigarettes Stomach bleeding Stomach ulcers, other stomach or intestine problems Take medications that treat or prevent blood clots Taking other steroids like dexamethasone or prednisone An unusual or allergic reaction to meloxicam, other medications, foods, dyes, or preservatives Pregnant or trying to get pregnant Breast-feeding How should I use this medication? Take this medication by mouth. Take it as directed on the prescription label at the same time every day. You can take it with or without food. If it upsets your stomach, take it with food. Do not use it more often than directed. There may be unused or extra doses in the bottle after you finish your treatment. Talk to your care team if you have questions about your dose. A special MedGuide will be given to you by the pharmacist with each prescription and refill. Be sure to read this information carefully each time. Talk to your care team about the use of this medication in children. Special care may be needed. Patients over 59 years of age may have a stronger reaction and need a smaller dose. Overdosage: If you think you have taken too much of this medicine contact a poison control center or emergency room at  once. NOTE: This medicine is only for you. Do not share this medicine with others. What if I miss a dose? If you miss a dose, take it as soon as you can. If it is almost time for your next dose, take only that dose. Do not take double or extra doses. What may interact with this medication? Do not take this medication with any of the following: Cidofovir Ketorolac This medication may also interact with the following: Aspirin and aspirin-like medications Certain medications for blood pressure, heart disease, irregular heart beat Certain medications for depression, anxiety, or psychotic disturbances Certain medications that treat or prevent blood clots like warfarin, enoxaparin, dalteparin, apixaban, dabigatran, rivaroxaban Cyclosporine Diuretics Fluconazole Lithium Methotrexate Other NSAIDs, medications for pain and inflammation, like ibuprofen and naproxen Pemetrexed This list may not describe all possible interactions. Give your health care provider a list of all the medicines, herbs, non-prescription drugs, or dietary supplements you use. Also tell them if you smoke, drink alcohol, or use illegal drugs. Some items may interact with your medicine. What should I watch for while using this medication? Visit your care team for regular checks on your progress. Tell your care team if your symptoms do not start to get better or if they get worse. Do not take other medications that contain aspirin, ibuprofen, or naproxen with this medication. Side effects such as stomach upset, nausea, or ulcers may be more likely to occur. Many non-prescription medications contain aspirin, ibuprofen, or naproxen. Always read labels carefully. This medication can cause serious ulcers and bleeding in the stomach. It can happen with no warning. Smoking, drinking alcohol, older age, and  poor health can also increase risks. Call your care team right away if you have stomach pain or blood in your vomit or stool. This  medication does not prevent a heart attack or stroke. This medication may increase the chance of a heart attack or stroke. The chance may increase the longer you use this medication or if you have heart disease. If you take aspirin to prevent a heart attack or stroke, talk to your care team about using this medication. Alcohol may interfere with the effect of this medication. Avoid alcoholic drinks. This medication may cause serious skin reactions. They can happen weeks to months after starting the medication. Contact your care team right away if you notice fevers or flu-like symptoms with a rash. The rash may be red or purple and then turn into blisters or peeling of the skin. Or, you might notice a red rash with swelling of the face, lips or lymph nodes in your neck or under your arms. Talk to your care team if you are pregnant before taking this medication. Taking this medication between weeks 20 and 30 of pregnancy may harm your unborn baby. Your care team will monitor you closely if you need to take it. After 30 weeks of pregnancy, do not take this medication. You may get drowsy or dizzy. Do not drive, use machinery, or do anything that needs mental alertness until you know how this medication affects you. Do not stand up or sit up quickly, especially if you are an older patient. This reduces the risk of dizzy or fainting spells. Be careful brushing or flossing your teeth or using a toothpick because you may get an infection or bleed more easily. If you have any dental work done, tell your dentist you are receiving this medication. This medication may make it more difficult to get pregnant. Talk to your care team if you are concerned about your fertility. What side effects may I notice from receiving this medication? Side effects that you should report to your care team as soon as possible: Allergic reactions--skin rash, itching, hives, swelling of the face, lips, tongue, or throat Bleeding--bloody or  black, tar-like stools, vomiting blood or brown material that looks like coffee grounds, red or dark brown urine, small red or purple spots on skin, unusual bruising or bleeding Heart attack--pain or tightness in the chest, shoulders, arms, or jaw, nausea, shortness of breath, cold or clammy skin, feeling faint or lightheaded Heart failure--shortness of breath, swelling of ankles, feet, or hands, sudden weight gain, unusual weakness or fatigue Increase in blood pressure Kidney injury--decrease in the amount of urine, swelling of the ankles, hands, or feet Liver injury--right upper belly pain, loss of appetite, nausea, light-colored stool, dark yellow or brown urine, yellowing skin or eyes, unusual weakness or fatigue Rash, fever, and swollen lymph nodes Redness, blistering, peeling, or loosening of the skin, including inside the mouth Stroke--sudden numbness or weakness of the face, arm, or leg, trouble speaking, confusion, trouble walking, loss of balance or coordination, dizziness, severe headache, change in vision Side effects that usually do not require medical attention (report to your care team if they continue or are bothersome): Diarrhea Nausea Upset stomach This list may not describe all possible side effects. Call your doctor for medical advice about side effects. You may report side effects to FDA at 1-800-FDA-1088. Where should I keep my medication? Keep out of the reach of children and pets. Store at room temperature between 20 and 25 degrees C (68 and  77 degrees F). Protect from moisture. Keep the container tightly closed. Get rid of any unused medication after the expiration date. To get rid of medications that are no longer needed or have expired: Take the medication to a medication take-back program. Check with your pharmacy or law enforcement to find a location. If you cannot return the medication, check the label or package insert to see if the medication should be thrown out  in the garbage or flushed down the toilet. If you are not sure, ask your care team. If it is safe to put it in the trash, empty the medication out of the container. Mix the medication with cat litter, dirt, coffee grounds, or other unwanted substance. Seal the mixture in a bag or container. Put it in the trash. NOTE: This sheet is a summary. It may not cover all possible information. If you have questions about this medicine, talk to your doctor, pharmacist, or health care provider.  2022 Elsevier/Gold Standard (2020-11-09 00:00:00)    Acute Back Pain, Adult Acute back pain is sudden and usually short-lived. It is often caused by an injury to the muscles and tissues in the back. The injury may result from: A muscle, tendon, or ligament getting overstretched or torn. Ligaments are tissues that connect bones to each other. Lifting something improperly can cause a back strain. Wear and tear (degeneration) of the spinal disks. Spinal disks are circular tissue that provide cushioning between the bones of the spine (vertebrae). Twisting motions, such as while playing sports or doing yard work. A hit to the back. Arthritis. You may have a physical exam, lab tests, and imaging tests to find the cause of your pain. Acute back pain usually goes away with rest and home care. Follow these instructions at home: Managing pain, stiffness, and swelling Take over-the-counter and prescription medicines only as told by your health care provider. Treatment may include medicines for pain and inflammation that are taken by mouth or applied to the skin, or muscle relaxants. Your health care provider may recommend applying ice during the first 24-48 hours after your pain starts. To do this: Put ice in a plastic bag. Place a towel between your skin and the bag. Leave the ice on for 20 minutes, 2-3 times a day. Remove the ice if your skin turns bright red. This is very important. If you cannot feel pain, heat, or cold,  you have a greater risk of damage to the area. If directed, apply heat to the affected area as often as told by your health care provider. Use the heat source that your health care provider recommends, such as a moist heat pack or a heating pad. Place a towel between your skin and the heat source. Leave the heat on for 20-30 minutes. Remove the heat if your skin turns bright red. This is especially important if you are unable to feel pain, heat, or cold. You have a greater risk of getting burned. Activity  Do not stay in bed. Staying in bed for more than 1-2 days can delay your recovery. Sit up and stand up straight. Avoid leaning forward when you sit or hunching over when you stand. If you work at a desk, sit close to it so you do not need to lean over. Keep your chin tucked in. Keep your neck drawn back, and keep your elbows bent at a 90-degree angle (right angle). Sit high and close to the steering wheel when you drive. Add lower back (lumbar) support to  your car seat, if needed. Take short walks on even surfaces as soon as you are able. Try to increase the length of time you walk each day. Do not sit, drive, or stand in one place for more than 30 minutes at a time. Sitting or standing for long periods of time can put stress on your back. Do not drive or use heavy machinery while taking prescription pain medicine. Use proper lifting techniques. When you bend and lift, use positions that put less stress on your back: [place] your knees. Keep the load close to your body. Avoid twisting. Exercise regularly as told by your health care provider. Exercising helps your back heal faster and helps prevent back injuries by keeping muscles strong and flexible. Work with a physical therapist to make a safe exercise program, as recommended by your health care provider. Do any exercises as told by your physical therapist. Lifestyle Maintain a healthy weight. Extra weight puts stress on your back and makes it  difficult to have good posture. Avoid activities or situations that make you feel anxious or stressed. Stress and anxiety increase muscle tension and can make back pain worse. Learn ways to manage anxiety and stress, such as through exercise. General instructions Sleep on a firm mattress in a comfortable position. Try lying on your side with your knees slightly bent. If you lie on your back, put a pillow under your knees. Keep your head and neck in a straight line with your spine (neutral position) when using electronic equipment like smartphones or pads. To do this: Raise your smartphone or pad to look at it instead of bending your head or neck to look down. Put the smartphone or pad at the level of your face while looking at the screen. Follow your treatment plan as told by your health care provider. This may include: Cognitive or behavioral therapy. Acupuncture or massage therapy. Meditation or yoga. Contact a health care provider if: You have pain that is not relieved with rest or medicine. You have increasing pain going down into your legs or buttocks. Your pain does not improve after 2 weeks. You have pain at night. You lose weight without trying. You have a fever or chills. You develop nausea or vomiting. You develop abdominal pain. Get help right away if: You develop new bowel or bladder control problems. You have unusual weakness or numbness in your arms or legs. You feel faint. These symptoms may represent a serious problem that is an emergency. Do not wait to see if the symptoms will go away. Get medical help right away. Call your local emergency services (911 in the [place] Do not drive yourself to the hospital. Summary Acute back pain is sudden and usually short-lived. Use proper lifting techniques. When you bend and lift, use positions that put less stress on your back. Take over-the-counter and prescription medicines only as told by your health care provider, and apply heat  or ice as told. This information is not intended to replace advice given to you by your health care provider. Make sure you discuss any questions you have with your health care provider. Document Revised: 11/04/2020 Document Reviewed: 11/04/2020 Elsevier Patient Education  2022 [place]. Hip Pain The hip is the joint between the upper legs and the lower pelvis. The bones, cartilage, tendons, and muscles of your hip joint support your body and allow you to move around. Hip pain can range from a minor ache to severe pain in one or both of your hips.  The pain may be felt on the inside of the hip joint near the groin, or on the outside near the buttocks and upper thigh. You may also have swelling or stiffness in your hip area. Follow these instructions at home: Managing pain, stiffness, and swelling   If directed, put ice on the painful area. To do this: Put ice in a plastic bag. Place a towel between your skin and the bag. Leave the ice on for 20 minutes, 2-3 times a day. If directed, apply heat to the affected area as often as told by your health care provider. Use the heat source that your health care provider recommends, such as a moist heat pack or a heating pad. Place a towel between your skin and the heat source. Leave the heat on for 20-30 minutes. Remove the heat if your skin turns bright red. This is especially important if you are unable to feel pain, heat, or cold. You may have a greater risk of getting burned. Activity Do exercises as told by your health care provider. Avoid activities that cause pain. General instructions  Take over-the-counter and prescription medicines only as told by your health care provider. Keep a journal of your symptoms. Write down: How often you have hip pain. The location of your pain. What the pain feels like. What makes the pain worse. Sleep with a pillow between your legs on your most comfortable side. Keep all follow-up visits as told by  your health care provider. This is important. Contact a health care provider if: You cannot put weight on your leg. Your pain or swelling continues or gets worse after one week. It gets harder to walk. You have a fever. Get help right away if: You fall. You have a sudden increase in pain and swelling in your hip. Your hip is red or swollen or very tender to touch. Summary Hip pain can range from a minor ache to severe pain in one or both of your hips. The pain may be felt on the inside of the hip joint near the groin, or on the outside near the buttocks and upper thigh. Avoid activities that cause pain. Write down how often you have hip pain, the location of the pain, what makes it worse, and what it feels like. This information is not intended to replace advice given to you by your health care provider. Make sure you discuss any questions you have with your health care provider. Document Revised: 12/29/2018 Document Reviewed: 12/29/2018 Elsevier Patient Education  [place].

## 2021-07-12 NOTE — Progress Notes (Signed)
Acute Office Visit  Subjective:    Patient ID: Megan Harper, female    DOB: [birth year], 60 y.o.   MRN: [id number]  Chief Complaint  Patient presents with   Hip Pain    Pt having pain in her right hip    Hip Pain  The incident occurred more than 1 week ago (has gotten worse in the past week.). There was no injury mechanism. The pain is present in the right hip. The pain is at a severity of 4/10. The pain is moderate. The pain has been Intermittent since onset. Pertinent negatives include no inability to bear weight, loss of motion, loss of sensation, muscle weakness, numbness or tingling. She reports no foreign bodies present. The symptoms are aggravated by movement. The treatment provided no relief.   She has frequent joint pains for years.She is seeing orthopedics at [facility] for lower back pain. She was told she has degenerative disc.    Patient  denies any fever,chills, rash, chest pain, shortness of breath, nausea, vomiting, or diarrhea.     Past Medical History:  Diagnosis Date   Anxiety    Cervical dysplasia    COPD (chronic obstructive pulmonary disease) (HCC)    Emphysema of lung (HCC)    Endometriosis    Fibrocystic breast disease    GERD (gastroesophageal reflux disease)    Osteoporosis    Thyroid nodule     Past Surgical History:  Procedure Laterality Date   laproscopy for endometriosis     THYROID LOBECTOMY Right 03/08/2010   TUBAL LIGATION  01/23/1986    Family History  Problem Relation Age of Onset   Heart disease Paternal Grandmother    Diabetes Paternal Grandmother    Hypertension Maternal Grandfather    Colon cancer Maternal Grandfather    Kidney cancer Maternal Grandfather    Uterine cancer Mother    Lung cancer Father        smoked   Kidney cancer Maternal Uncle    Heart disease Maternal Grandmother    Hypertension Maternal Grandmother    Thyroid disease Maternal Grandmother    Heart disease Maternal Uncle    Thyroid disease  Daughter     Social History   Socioeconomic History   Marital status: Married    Spouse name: Not on file   Number of children: 3   Years of education: Not on file   Highest education level: Not on file  Occupational History   Occupation: customer advocate    Employer: [employer]  Tobacco Use   Smoking status: Former    Packs/day: 2.50    Years: 31.00    Pack years: 77.50    Types: Cigarettes    Quit date: 09/17/2009    Years since quitting: 11.8   Smokeless tobacco: Never  Vaping Use   Vaping Use: Never used  Substance and Sexual Activity   Alcohol use: No   Drug use: No   Sexual activity: Yes    Birth control/protection: Surgical  Other Topics Concern   Not on file  Social History Narrative   Not on file   Social Determinants of Health   Financial Resource Strain: Not on file  Food Insecurity: Not on file  Transportation Needs: Not on file  Physical Activity: Not on file  Stress: Not on file  Social Connections: Not on file  Intimate Partner Violence: Not on file    Outpatient Medications Prior to Visit  Medication Sig Dispense Refill   albuterol (PROVENTIL HFA;VENTOLIN  HFA) 108 (90 Base) MCG/ACT inhaler Inhale 2 puffs into the lungs every 6 (six) hours as needed for wheezing or shortness of breath. 1 Inhaler 2   fluticasone (FLONASE) 50 MCG/ACT nasal spray Place 2 sprays into both nostrils daily. 18.2 mL 2   omeprazole (PRILOSEC) 20 MG capsule Take 1 capsule (20 mg total) by mouth daily. 30 capsule 11   acetic acid-hydrocortisone (VOSOL-HC) OTIC solution Place 3 drops into both ears 3 (three) times daily. Prn x 4-7 days (Patient not taking: Reported on 07/12/2021) 10 mL 0   ALPRAZolam (XANAX) 0.5 MG tablet Take 1 tablet (0.5 mg total) by mouth 2 (two) times daily as needed for sleep. (Patient not taking: Reported on 07/12/2021) 60 tablet 0   ANORO ELLIPTA 62.5-25 MCG/INH AEPB INHALE 1 PUFF BY MOUTH ONCE DAILY FOR 1 DAY (Patient not taking: No sig  reported) 60 each 6   ciclopirox (PENLAC) 8 % solution Apply topically at bedtime. Apply over nail and surrounding skin. Apply daily over previous coat. After seven (7) days, may remove with alcohol and continue cycle. (Patient not taking: Reported on 07/12/2021) 6.6 mL 2   levothyroxine (SYNTHROID) 50 MCG tablet Take 1 tablet (50 mcg total) by mouth daily before breakfast. (Patient not taking: Reported on 07/12/2021) 90 tablet 3   methocarbamol (ROBAXIN) 500 MG tablet Robaxin 500 mg tablet  Take 1 tablet twice a day by oral route as needed for 15 days.     Vitamin D, Ergocalciferol, (DRISDOL) 1.25 MG (50000 UNIT) CAPS capsule Take 1 capsule (50,000 Units total) by mouth every 7 (seven) days. (taking one tablet per week) walk in lab in office 1-2 weeks after completing prescription. (Patient not taking: No sig reported) 12 capsule 0   No facility-administered medications prior to visit.    Allergies  Allergen Reactions   Cefaclor Other (See Comments)    Flu like symptoms   Pantoprazole Itching    Review of Systems  Constitutional: Negative.   HENT: Negative.    Respiratory: Negative.    Cardiovascular: Negative.   Gastrointestinal: Negative.   Genitourinary: Negative.  Negative for flank pain.  Musculoskeletal:  Positive for arthralgias and back pain. Negative for gait problem, joint swelling, myalgias, neck pain and neck stiffness.  Neurological: Negative.  Negative for tingling and numbness.  Hematological: Negative.   Psychiatric/Behavioral: Negative.        Objective:    Physical Exam Vitals reviewed.  Constitutional:      General: She is not in acute distress.    Appearance: She is well-developed. She is not diaphoretic.     Interventions: She is not intubated. HENT:     Head: Normocephalic and atraumatic.     Right Ear: External ear normal.     Left Ear: External ear normal.     Nose: Nose normal.     Mouth/Throat:     Pharynx: No oropharyngeal exudate.  Eyes:      General: Lids are normal. No scleral icterus.       Right eye: No discharge.        Left eye: No discharge.     Conjunctiva/sclera: Conjunctivae normal.     Right eye: Right conjunctiva is not injected. No exudate or hemorrhage.    Left eye: Left conjunctiva is not injected. No exudate or hemorrhage.    Pupils: Pupils are equal, round, and reactive to light.  Neck:     Thyroid: No thyroid mass or thyromegaly.     Vascular: Normal carotid  pulses. No carotid bruit, hepatojugular reflux or JVD.     Trachea: Trachea and phonation normal. No tracheal tenderness or tracheal deviation.     Meningeal: Brudzinski's sign and Kernig's sign absent.  Cardiovascular:     Rate and Rhythm: Normal rate and regular rhythm.     Pulses: Normal pulses.          Radial pulses are 2+ on the right side and 2+ on the left side.       Dorsalis pedis pulses are 2+ on the right side and 2+ on the left side.       Posterior tibial pulses are 2+ on the right side and 2+ on the left side.     Heart sounds: Normal heart sounds, S1 normal and S2 normal. Heart sounds not distant. No murmur heard.   No friction rub. No gallop.  Pulmonary:     Effort: Pulmonary effort is normal. No tachypnea, bradypnea, accessory muscle usage or respiratory distress. She is not intubated.     Breath sounds: Normal breath sounds. No stridor. No wheezing or rales.  Chest:     Chest wall: No tenderness.  Abdominal:     General: Bowel sounds are normal. There is no distension or abdominal bruit.     Palpations: Abdomen is soft. There is no shifting dullness, fluid wave, hepatomegaly, splenomegaly, mass or pulsatile mass.     Tenderness: There is no abdominal tenderness. There is no guarding or rebound.     Hernia: No hernia is present.  Musculoskeletal:        General: No tenderness or deformity. Normal range of motion.     Cervical back: Full passive range of motion without pain, normal range of motion and neck supple. No edema, erythema  or rigidity. No spinous process tenderness or muscular tenderness. Normal range of motion.     Right lower leg: No edema.     Left lower leg: No edema.  Lymphadenopathy:     Head:     Right side of head: No submental, submandibular, tonsillar, preauricular, posterior auricular or occipital adenopathy.     Left side of head: No submental, submandibular, tonsillar, preauricular, posterior auricular or occipital adenopathy.     Cervical: No cervical adenopathy.     Right cervical: No superficial, deep or posterior cervical adenopathy.    Left cervical: No superficial, deep or posterior cervical adenopathy.     Upper Body:     Right upper body: No supraclavicular or pectoral adenopathy.     Left upper body: No supraclavicular or pectoral adenopathy.  Skin:    General: Skin is warm and dry.     Coloration: Skin is not pale.     Findings: No abrasion, bruising, burn, ecchymosis, erythema, lesion, petechiae or rash.     Nails: There is no clubbing.  Neurological:     Mental Status: She is alert and oriented to person, place, and time.     GCS: GCS eye subscore is 4. GCS verbal subscore is 5. GCS motor subscore is 6.     Cranial Nerves: No cranial nerve deficit.     Sensory: No sensory deficit.     Motor: No tremor, atrophy, abnormal muscle tone or seizure activity.     Coordination: Coordination normal.     Gait: Gait normal.     Deep Tendon Reflexes: Reflexes are normal and symmetric. Reflexes normal. Babinski sign absent on the right side. Babinski sign absent on the left side.  Reflex Scores:      Tricep reflexes are 2+ on the right side and 2+ on the left side.      Bicep reflexes are 2+ on the right side and 2+ on the left side.      Brachioradialis reflexes are 2+ on the right side and 2+ on the left side.      Patellar reflexes are 2+ on the right side and 2+ on the left side.      Achilles reflexes are 2+ on the right side and 2+ on the left side. Psychiatric:        Speech:  Speech normal.        Behavior: Behavior normal.        Thought Content: Thought content normal.        Judgment: Judgment normal.    BP 122/84   Pulse 71   Temp (!) 96.5 F (35.8 C)   Ht 5' 5.98" (1.676 m)   Wt 179 lb 6.4 oz (81.4 kg)   SpO2 96%   BMI 28.97 kg/m  Wt Readings from Last 3 Encounters:  07/12/21 179 lb 6.4 oz (81.4 kg)  03/15/21 175 lb 12.8 oz (79.7 kg)  01/26/21 174 lb (78.9 kg)    Health Maintenance Due  Topic Date Due   Pneumococcal Vaccine 78-103 Years old (1 - PCV) [birth year]01/1968   Zoster Vaccines- Shingrix (1 of 2) Never done   INFLUENZA VACCINE  Never done    There are no preventive care reminders to display for this patient.   Lab Results  Component Value Date   TSH 3.530 07/12/2021   Lab Results  Component Value Date   WBC 6.9 07/12/2021   HGB 14.0 07/12/2021   HCT 41.7 07/12/2021   MCV 95 07/12/2021   PLT 295 07/12/2021   Lab Results  Component Value Date   NA 141 07/12/2021   K 3.9 07/12/2021   CO2 27 07/12/2021   GLUCOSE 78 07/12/2021   BUN 8 07/12/2021   CREATININE 0.82 07/12/2021   BILITOT 0.4 07/12/2021   ALKPHOS 93 07/12/2021   AST 17 07/12/2021   ALT 16 07/12/2021   PROT 6.7 07/12/2021   ALBUMIN 4.5 07/12/2021   CALCIUM 9.4 07/12/2021   EGFR 82 07/12/2021   GFR 93.75 07/23/2017   Lab Results  Component Value Date   CHOL 183 12/14/2020   Lab Results  Component Value Date   HDL 60 12/14/2020   Lab Results  Component Value Date   LDLCALC 112 (H) 12/14/2020   Lab Results  Component Value Date   TRIG 59 12/14/2020   No results found for: CHOLHDL No results found for: HGBA1C     Assessment & Plan:   Problem List Items Addressed This Visit       Other   History of thyroid nodule   Relevant Orders   TSH   Antinuclear Antib (ANA) (Completed)   CBC w/Diff (Completed)   Comp Met (CMET) (Completed)   Lyme Disease, Western Blot (Completed)   Sedimentation rate (Completed)   TSH (Completed)   Other Visit  Diagnoses     Arthralgia, unspecified joint    -  Primary   Relevant Medications   meloxicam (MOBIC) 7.5 MG tablet   Other Relevant Orders   ANA   CBC with Differential/Platelet   Comprehensive metabolic panel   Lyme Disease, Western Blot   Rheumatoid Factor   Antinuclear Antib (ANA) (Completed)   CBC w/Diff (Completed)   Comp Met (CMET) (  Completed)   Lyme Disease, Western Blot (Completed)   Sedimentation rate (Completed)   Mid back pain       Relevant Medications   methocarbamol (ROBAXIN) 500 MG tablet   meloxicam (MOBIC) 7.5 MG tablet   Other Relevant Orders   Lyme Disease, Western Blot (Completed)   Rheumatoid Factor (Completed)   Sedimentation rate (Completed)   TSH (Completed)   Hip pain, bilateral       Relevant Orders   DG HIPS BILAT WITH PELVIS 2V (Completed)   Rheumatoid Factor (Completed)   DDD (degenerative disc disease), lumbar       Relevant Medications   methocarbamol (ROBAXIN) 500 MG tablet   meloxicam (MOBIC) 7.5 MG tablet   Other Relevant Orders   DG Lumbar Spine Complete (Completed)   Antinuclear Antib (ANA) (Completed)   CBC w/Diff (Completed)   Comp Met (CMET) (Completed)   Lyme Disease, Western Blot (Completed)   Rheumatoid Factor (Completed)     She will return to orthopedics likely worsening right sided hip pain is coming out of lower back. She also had generalized arthralgias. Will check some labs. Mobic as directed and Robaxin trial  Discussed side effects.    Meds ordered this encounter  Medications   meloxicam (MOBIC) 7.5 MG tablet    Sig: Take 1 tablet (7.5 mg total) by mouth daily.    Dispense:  30 tablet    Refill:  0  Red Flags discussed. The patient was given clear instructions to go to ER or return to medical center if any red flags develop, symptoms do not improve, worsen or new problems develop. They verbalized understanding.  Return in about 1 month (around 08/11/2021), or if symptoms worsen or fail to improve, for at any time  for any worsening symptoms, Go to Emergency room/ urgent care if worse.     Marcille Buffy, FNP

## 2021-07-13 NOTE — Progress Notes (Signed)
Labs within normal limits. Lymes disease and ANA labs still pending will result once we receive.

## 2021-07-15 LAB — LYME DISEASE, WESTERN BLOT
IgG P18 Ab.: ABSENT
IgG P23 Ab.: ABSENT
IgG P28 Ab.: ABSENT
IgG P30 Ab.: ABSENT
IgG P41 Ab.: ABSENT
IgG P45 Ab.: ABSENT
IgG P58 Ab.: ABSENT
IgG P66 Ab.: ABSENT
IgM P23 Ab.: ABSENT
IgM P39 Ab.: ABSENT
IgM P41 Ab.: ABSENT
Lyme IgG Wb: NEGATIVE
Lyme IgM Wb: NEGATIVE

## 2021-07-15 LAB — COMPREHENSIVE METABOLIC PANEL WITH GFR
ALT: 16 IU/L (ref 0–32)
AST: 17 IU/L (ref 0–40)
Albumin/Globulin Ratio: 2 (ref 1.2–2.2)
Albumin: 4.5 g/dL (ref 3.8–4.9)
Alkaline Phosphatase: 93 IU/L (ref 44–121)
BUN/Creatinine Ratio: 10 (ref 9–23)
BUN: 8 mg/dL (ref 6–24)
Bilirubin Total: 0.4 mg/dL (ref 0.0–1.2)
CO2: 27 mmol/L (ref 20–29)
Calcium: 9.4 mg/dL (ref 8.7–10.2)
Chloride: 101 mmol/L (ref 96–106)
Creatinine, Ser: 0.82 mg/dL (ref 0.57–1.00)
Globulin, Total: 2.2 g/dL (ref 1.5–4.5)
Glucose: 78 mg/dL (ref 70–99)
Potassium: 3.9 mmol/L (ref 3.5–5.2)
Sodium: 141 mmol/L (ref 134–144)
Total Protein: 6.7 g/dL (ref 6.0–8.5)
eGFR: 82 mL/min/1.73

## 2021-07-15 LAB — CBC WITH DIFFERENTIAL/PLATELET
Basophils Absolute: 0.1 10*3/uL (ref 0.0–0.2)
Basos: 1 %
EOS (ABSOLUTE): 0.1 10*3/uL (ref 0.0–0.4)
Eos: 1 %
Hematocrit: 41.7 % (ref 34.0–46.6)
Hemoglobin: 14 g/dL (ref 11.1–15.9)
Immature Grans (Abs): 0 10*3/uL (ref 0.0–0.1)
Immature Granulocytes: 0 %
Lymphocytes Absolute: 1.8 10*3/uL (ref 0.7–3.1)
Lymphs: 26 %
MCH: 31.9 pg (ref 26.6–33.0)
MCHC: 33.6 g/dL (ref 31.5–35.7)
MCV: 95 fL (ref 79–97)
Monocytes Absolute: 0.4 10*3/uL (ref 0.1–0.9)
Monocytes: 6 %
Neutrophils Absolute: 4.5 10*3/uL (ref 1.4–7.0)
Neutrophils: 66 %
Platelets: 295 10*3/uL (ref 150–450)
RBC: 4.39 x10E6/uL (ref 3.77–5.28)
RDW: 12 % (ref 11.7–15.4)
WBC: 6.9 10*3/uL (ref 3.4–10.8)

## 2021-07-15 LAB — SEDIMENTATION RATE: Sed Rate: 30 mm/h (ref 0–40)

## 2021-07-15 LAB — TSH: TSH: 3.53 u[IU]/mL (ref 0.450–4.500)

## 2021-07-15 LAB — ANA: Anti Nuclear Antibody (ANA): NEGATIVE

## 2021-07-15 LAB — RHEUMATOID FACTOR: Rheumatoid fact SerPl-aCnc: <10 [IU]/mL

## 2021-07-16 ENCOUNTER — Encounter: Payer: Self-pay | Admitting: Adult Health

## 2021-07-16 NOTE — Progress Notes (Signed)
IGG for past lymes antibodies, given her joint pains, will send her to rheumatology for further evaluation. No active lymes antibodies are present.

## 2021-07-17 ENCOUNTER — Other Ambulatory Visit: Payer: Self-pay | Admitting: Adult Health

## 2021-07-17 DIAGNOSIS — M2550 Pain in unspecified joint: Secondary | ICD-10-CM | POA: Insufficient documentation

## 2021-07-17 NOTE — Progress Notes (Signed)
Orders Placed This Encounter  Procedures   Ambulatory referral to Rheumatology    Referral Priority:   Urgent    Referral Type:   Consultation    Referral Reason:   Specialty Services Required    Referred to Provider:   Bo Merino, MD    Requested Specialty:   Rheumatology    Number of Visits Requested:   1

## 2021-08-15 ENCOUNTER — Ambulatory Visit: Payer: 59 | Admitting: Adult Health

## 2021-08-15 ENCOUNTER — Other Ambulatory Visit: Payer: Self-pay | Admitting: Adult Health

## 2021-08-15 ENCOUNTER — Other Ambulatory Visit: Payer: Self-pay

## 2021-08-15 ENCOUNTER — Telehealth: Payer: Self-pay | Admitting: Adult Health

## 2021-08-15 ENCOUNTER — Ambulatory Visit (INDEPENDENT_AMBULATORY_CARE_PROVIDER_SITE_OTHER): Payer: 59 | Admitting: Adult Health

## 2021-08-15 ENCOUNTER — Encounter: Payer: Self-pay | Admitting: Adult Health

## 2021-08-15 VITALS — BP 130/78 | HR 67 | Temp 98.0°F | Ht 65.98 in | Wt 179.1 lb

## 2021-08-15 DIAGNOSIS — E559 Vitamin D deficiency, unspecified: Secondary | ICD-10-CM

## 2021-08-15 DIAGNOSIS — M2550 Pain in unspecified joint: Secondary | ICD-10-CM

## 2021-08-15 DIAGNOSIS — R238 Other skin changes: Secondary | ICD-10-CM

## 2021-08-15 DIAGNOSIS — J0140 Acute pansinusitis, unspecified: Secondary | ICD-10-CM

## 2021-08-15 DIAGNOSIS — E538 Deficiency of other specified B group vitamins: Secondary | ICD-10-CM

## 2021-08-15 DIAGNOSIS — R399 Unspecified symptoms and signs involving the genitourinary system: Secondary | ICD-10-CM | POA: Diagnosis not present

## 2021-08-15 DIAGNOSIS — E890 Postprocedural hypothyroidism: Secondary | ICD-10-CM

## 2021-08-15 LAB — POCT URINALYSIS DIPSTICK
Bilirubin, UA: NEGATIVE
Glucose, UA: NEGATIVE
Ketones, UA: NEGATIVE
Nitrite, UA: NEGATIVE
Protein, UA: NEGATIVE
Spec Grav, UA: 1.02 (ref 1.010–1.025)
Urobilinogen, UA: 1 E.U./dL
pH, UA: 7 (ref 5.0–8.0)

## 2021-08-15 LAB — URINALYSIS, ROUTINE W REFLEX MICROSCOPIC
Bilirubin Urine: NEGATIVE
Hgb urine dipstick: NEGATIVE
Ketones, ur: NEGATIVE
Leukocytes,Ua: NEGATIVE
Nitrite: NEGATIVE
Specific Gravity, Urine: 1.02 (ref 1.000–1.030)
Total Protein, Urine: NEGATIVE
Urine Glucose: NEGATIVE
Urobilinogen, UA: 1 (ref 0.0–1.0)
pH: 7.5 (ref 5.0–8.0)

## 2021-08-15 MED ORDER — OMEPRAZOLE 20 MG PO CPDR: 20.0000 mg | DELAYED_RELEASE_CAPSULE | Freq: Every day | ORAL | 11 refills | Status: DC

## 2021-08-15 MED ORDER — AMOXICILLIN-POT CLAVULANATE 875-125 MG PO TABS: 1.0000 | ORAL_TABLET | Freq: Two times a day (BID) | ORAL | 0 refills | Status: DC

## 2021-08-15 MED ORDER — TRIAMCINOLONE ACETONIDE 0.1 % EX CREA: 1.0000 "application " | TOPICAL_CREAM | Freq: Two times a day (BID) | CUTANEOUS | 0 refills | Status: DC

## 2021-08-15 MED ORDER — MELOXICAM 7.5 MG PO TABS: 7.5000 mg | ORAL_TABLET | Freq: Every day | ORAL | 0 refills | Status: DC

## 2021-08-15 NOTE — Progress Notes (Signed)
Established Patient Office Visit  Subjective:  Patient ID: Megan Harper, female    DOB: [birth year]  Age: 60 y.o. MRN: [id number]  CC:  Chief Complaint  Patient presents with   Follow-up   Urinary Frequency   Urinary Urgency    HPI Megan Harper presents for follow up on hip pain, she is seeing orthopedics at [facility] .She did take a round of prednisone for right hip pain, has improved with Prednisone. She did not try muscle relaxer.She may be having MRI hip but waiting on insurance to change for new year. Did round of prednisone and then started Mobic 7.5 mg and feels better has more movements.    Has had some recent urinary symptoms after finishing prednisone.  Mild vaginal itching. Has pelvic pressure, urgency.   ANA and RH factor were negative. She did have the presence of some antibodies for lyme's disease on last labs. Referred to Dr. Nicki Reaper for arthralgias at rheumatology.  Goes January 20th.  Past Medical History:  Diagnosis Date   Anxiety    Cervical dysplasia    COPD (chronic obstructive pulmonary disease) (HCC)    Emphysema of lung (HCC)    Endometriosis    Fibrocystic breast disease    GERD (gastroesophageal reflux disease)    Osteoporosis    Thyroid nodule     Past Surgical History:  Procedure Laterality Date   laproscopy for endometriosis     THYROID LOBECTOMY Right 03/08/2010   TUBAL LIGATION  01/23/1986    Family History  Problem Relation Age of Onset   Heart disease Paternal Grandmother    Diabetes Paternal Grandmother    Hypertension Maternal Grandfather    Colon cancer Maternal Grandfather    Kidney cancer Maternal Grandfather    Uterine cancer Mother    Lung cancer Father        smoked   Kidney cancer Maternal Uncle    Heart disease Maternal Grandmother    Hypertension Maternal Grandmother    Thyroid disease Maternal Grandmother    Heart disease Maternal Uncle    Thyroid disease Daughter     Social History   Socioeconomic History    Marital status: Married    Spouse name: Not on file   Number of children: 3   Years of education: Not on file   Highest education level: Not on file  Occupational History   Occupation: customer advocate    Employer: [employer]  Tobacco Use   Smoking status: Former    Packs/day: 2.50    Years: 31.00    Pack years: 77.50    Types: Cigarettes    Quit date: 09/17/2009    Years since quitting: 11.9   Smokeless tobacco: Never  Vaping Use   Vaping Use: Never used  Substance and Sexual Activity   Alcohol use: No   Drug use: No   Sexual activity: Yes    Birth control/protection: Surgical  Other Topics Concern   Not on file  Social History Narrative   Not on file   Social Determinants of Health   Financial Resource Strain: Not on file  Food Insecurity: Not on file  Transportation Needs: Not on file  Physical Activity: Not on file  Stress: Not on file  Social Connections: Not on file  Intimate Partner Violence: Not on file    Outpatient Medications Prior to Visit  Medication Sig Dispense Refill   albuterol (PROVENTIL HFA;VENTOLIN HFA) 108 (90 Base) MCG/ACT inhaler Inhale 2 puffs into the lungs every  6 (six) hours as needed for wheezing or shortness of breath. 1 Inhaler 2   fluticasone (FLONASE) 50 MCG/ACT nasal spray Place 2 sprays into both nostrils daily. 18.2 mL 2   methocarbamol (ROBAXIN) 500 MG tablet Robaxin 500 mg tablet  Take 1 tablet twice a day by oral route as needed for 15 days.     meloxicam (MOBIC) 7.5 MG tablet Take 1 tablet (7.5 mg total) by mouth daily. 30 tablet 0   omeprazole (PRILOSEC) 20 MG capsule Take 1 capsule (20 mg total) by mouth daily. 30 capsule 11   acetic acid-hydrocortisone (VOSOL-HC) OTIC solution Place 3 drops into both ears 3 (three) times daily. Prn x 4-7 days (Patient not taking: Reported on 07/12/2021) 10 mL 0   ALPRAZolam (XANAX) 0.5 MG tablet Take 1 tablet (0.5 mg total) by mouth 2 (two) times daily as needed for sleep. (Patient not  taking: Reported on 07/12/2021) 60 tablet 0   ANORO ELLIPTA 62.5-25 MCG/INH AEPB INHALE 1 PUFF BY MOUTH ONCE DAILY FOR 1 DAY (Patient not taking: Reported on 03/15/2021) 60 each 6   ciclopirox (PENLAC) 8 % solution Apply topically at bedtime. Apply over nail and surrounding skin. Apply daily over previous coat. After seven (7) days, may remove with alcohol and continue cycle. (Patient not taking: Reported on 07/12/2021) 6.6 mL 2   levothyroxine (SYNTHROID) 50 MCG tablet Take 1 tablet (50 mcg total) by mouth daily before breakfast. (Patient not taking: Reported on 07/12/2021) 90 tablet 3   Vitamin D, Ergocalciferol, (DRISDOL) 1.25 MG (50000 UNIT) CAPS capsule Take 1 capsule (50,000 Units total) by mouth every 7 (seven) days. (taking one tablet per week) walk in lab in office 1-2 weeks after completing prescription. (Patient not taking: Reported on 03/15/2021) 12 capsule 0   No facility-administered medications prior to visit.    Allergies  Allergen Reactions   Cefaclor Other (See Comments)    Flu like symptoms   Pantoprazole Itching    ROS Review of Systems  Constitutional: Negative.   HENT: Negative.    Respiratory: Negative.    Cardiovascular: Negative.   Gastrointestinal: Negative.   Genitourinary: Negative.   Musculoskeletal:  Positive for arthralgias. Negative for back pain, gait problem, joint swelling, myalgias, neck pain and neck stiffness.  Neurological: Negative.   Psychiatric/Behavioral: Negative.       Objective:    Physical Exam Vitals and nursing note reviewed.  Constitutional:      General: She is not in acute distress.    Appearance: Normal appearance. She is not ill-appearing, toxic-appearing or diaphoretic.  HENT:     Head: Normocephalic and atraumatic.     Jaw: There is normal jaw occlusion.     Salivary Glands: Right salivary gland is not diffusely enlarged or tender. Left salivary gland is not diffusely enlarged or tender.     Right Ear: External ear normal.  Tenderness present. A middle ear effusion is present. Tympanic membrane is erythematous. Tympanic membrane is not perforated.     Left Ear: External ear normal. Tenderness present. A middle ear effusion is present. Tympanic membrane is erythematous. Tympanic membrane is not perforated.     Nose: Congestion present. No rhinorrhea.     Mouth/Throat:     Mouth: Mucous membranes are moist.     Pharynx: Oropharynx is clear.  Eyes:     Conjunctiva/sclera: Conjunctivae normal.  Neck:     Thyroid: No thyroid mass or thyromegaly.  Cardiovascular:     Rate and Rhythm: Normal rate and  regular rhythm.     Pulses: Normal pulses.     Heart sounds: Normal heart sounds. No murmur heard.   No friction rub. No gallop.  Pulmonary:     Effort: Pulmonary effort is normal. No respiratory distress.     Breath sounds: Normal breath sounds. No stridor. No wheezing, rhonchi or rales.  Chest:     Chest wall: No tenderness.  Abdominal:     Palpations: Abdomen is soft.  Musculoskeletal:        General: Normal range of motion.     Cervical back: Full passive range of motion without pain, normal range of motion and neck supple.  Lymphadenopathy:     Cervical: No cervical adenopathy.     Right cervical: No superficial, deep or posterior cervical adenopathy.    Left cervical: No superficial, deep or posterior cervical adenopathy.  Skin:    General: Skin is warm.     Findings: No erythema or rash.     Comments: History of itchy rash flares, none now, needs refill on steroid cream  Neurological:     General: No focal deficit present.     Mental Status: She is alert and oriented to person, place, and time.     Motor: No weakness.     Gait: Gait normal.  Psychiatric:        Mood and Affect: Mood normal.        Behavior: Behavior normal.        Thought Content: Thought content normal.        Judgment: Judgment normal.    BP 130/78    Pulse 67    Temp 98 F (36.7 C)    Ht 5' 5.98" (1.676 m)    Wt 179 lb 1.9  oz (81.2 kg)    SpO2 97%    BMI 28.92 kg/m  Wt Readings from Last 3 Encounters:  08/15/21 179 lb 1.9 oz (81.2 kg)  07/12/21 179 lb 6.4 oz (81.4 kg)  03/15/21 175 lb 12.8 oz (79.7 kg)     Health Maintenance Due  Topic Date Due   Pneumococcal Vaccine 69-103 Years old (1 - PCV) [birth year]01/1968   Zoster Vaccines- Shingrix (1 of 2) Never done   INFLUENZA VACCINE  Never done    There are no preventive care reminders to display for this patient.  Lab Results  Component Value Date   TSH 3.530 07/12/2021   Lab Results  Component Value Date   WBC 6.9 07/12/2021   HGB 14.0 07/12/2021   HCT 41.7 07/12/2021   MCV 95 07/12/2021   PLT 295 07/12/2021   Lab Results  Component Value Date   NA 141 07/12/2021   K 3.9 07/12/2021   CO2 27 07/12/2021   GLUCOSE 78 07/12/2021   BUN 8 07/12/2021   CREATININE 0.82 07/12/2021   BILITOT 0.4 07/12/2021   ALKPHOS 93 07/12/2021   AST 17 07/12/2021   ALT 16 07/12/2021   PROT 6.7 07/12/2021   ALBUMIN 4.5 07/12/2021   CALCIUM 9.4 07/12/2021   EGFR 82 07/12/2021   GFR 93.75 07/23/2017   Lab Results  Component Value Date   CHOL 183 12/14/2020   Lab Results  Component Value Date   HDL 60 12/14/2020   Lab Results  Component Value Date   LDLCALC 112 (H) 12/14/2020   Lab Results  Component Value Date   TRIG 59 12/14/2020   No results found for: CHOLHDL No results found for: HGBA1C    Assessment & Plan:  Problem List Items Addressed This Visit       Endocrine   Hypothyroidism   Relevant Orders   TSH     Other   B12 deficiency   Relevant Orders   B12 and Folate Panel   Arthralgia   Relevant Medications   meloxicam (MOBIC) 7.5 MG tablet   Other Relevant Orders   Comprehensive metabolic panel   Other Visit Diagnoses     Urinary symptom or sign    -  Primary   Relevant Medications   amoxicillin-clavulanate (AUGMENTIN) 875-125 MG tablet   Other Relevant Orders   Urinalysis, Routine w reflex microscopic   Urine Culture    Acute non-recurrent pansinusitis       Relevant Medications   amoxicillin-clavulanate (AUGMENTIN) 875-125 MG tablet   Skin irritation       Relevant Medications   triamcinolone cream (KENALOG) 0.1 %   Vitamin D deficiency       Relevant Orders   VITAMIN D 25 Hydroxy (Vit-D Deficiency, Fractures)     Leukocytes are in urine today, patient also has sinusitis, she has had this ongoing for over 2 months has not taken any medication for this.  We will place her on Augmentin.  We will send urine for culture and urine microscopic.  Patient will also stay on Mobic 7.5 mg once daily.  She should keep her appointment with  rheumatology for positive IgG for labs in the past. IGM lyme antibodies were negative.   She will keep her appointment with Dr. Scarlette Shorts at [facility] for her chronic hip and back pain.  She does have other arthralgias.  Her rheumatoid factor sed rate and ANA were negative.  Patient did have positive antibodies IgG for Lyme's in the past.  She has been referred for rheumatology for this.  She does note improvement with round of prednisone and then following up started taking Mobic 7.5 mg.  She is also taking Protonix 20 mg daily for GERD.  This is chronic medication.   Meds ordered this encounter  Medications   amoxicillin-clavulanate (AUGMENTIN) 875-125 MG tablet    Sig: Take 1 tablet by mouth 2 (two) times daily.    Dispense:  20 tablet    Refill:  0   meloxicam (MOBIC) 7.5 MG tablet    Sig: Take 1 tablet (7.5 mg total) by mouth daily.    Dispense:  90 tablet    Refill:  0   omeprazole (PRILOSEC) 20 MG capsule    Sig: Take 1 capsule (20 mg total) by mouth daily.    Dispense:  30 capsule    Refill:  11   triamcinolone cream (KENALOG) 0.1 %    Sig: Apply 1 application topically 2 (two) times daily.    Dispense:  30 g    Refill:  0   Red Flags discussed. The patient was given clear instructions to go to ER or return to medical center if any red flags develop, symptoms  do not improve, worsen or new problems develop. They verbalized understanding.  Follow-up: Return in about 3 months (around 11/13/2021), or if symptoms worsen or fail to improve, for at any time for any worsening symptoms, Go to Emergency room/ urgent care if worse.    Marcille Buffy, FNP

## 2021-08-15 NOTE — Patient Instructions (Addendum)
Sinusitis, Adult Sinusitis is inflammation of your sinuses. Sinuses are hollow spaces in the bones around your face. Your sinuses are located: Around your eyes. In the middle of your forehead. Behind your nose. In your cheekbones. Mucus normally drains out of your sinuses. When your nasal tissues become inflamed or swollen, mucus can become trapped or blocked. This allows bacteria, viruses, and fungi to grow, which leads to infection. Most infections of the sinuses are caused by a virus. Sinusitis can develop quickly. It can last for up to 4 weeks (acute) or for more than 12 weeks (chronic). Sinusitis often develops after a cold. What are the causes? This condition is caused by anything that creates swelling in the sinuses or stops mucus from draining. This includes: Allergies. Asthma. Infection from bacteria or viruses. Deformities or blockages in your nose or sinuses. Abnormal growths in the nose (nasal polyps). Pollutants, such as chemicals or irritants in the air. Infection from fungi (rare). What increases the risk? You are more likely to develop this condition if you: Have a weak body defense system (immune system). Do a lot of swimming or diving. Overuse nasal sprays. Smoke. What are the signs or symptoms? The main symptoms of this condition are pain and a feeling of pressure around the affected sinuses. Other symptoms include: Stuffy nose or congestion. Thick drainage from your nose. Swelling and warmth over the affected sinuses. Headache. Upper toothache. A cough that may get worse at night. Extra mucus that collects in the throat or the back of the nose (postnasal drip). Decreased sense of smell and taste. Fatigue. A fever. Sore throat. Bad breath. How is this diagnosed? This condition is diagnosed based on: Your symptoms. Your medical history. A physical exam. Tests to find out if your condition is acute or chronic. This may include: Checking your nose for  nasal polyps. Viewing your sinuses using a device that has a light (endoscope). Testing for allergies or bacteria. Imaging tests, such as an MRI or CT scan. In rare cases, a bone biopsy may be done to rule out more serious types of fungal sinus disease. How is this treated? Treatment for sinusitis depends on the cause and whether your condition is chronic or acute. If caused by a virus, your symptoms should go away on their own within 10 days. You may be given medicines to relieve symptoms. They include: Medicines that shrink swollen nasal passages (topical intranasal decongestants). Medicines that treat allergies (antihistamines). A spray that eases inflammation of the nostrils (topical intranasal corticosteroids). Rinses that help get rid of thick mucus in your nose (nasal saline washes). If caused by bacteria, your health care provider may recommend waiting to see if your symptoms improve. Most bacterial infections will get better without antibiotic medicine. You may be given antibiotics if you have: A severe infection. A weak immune system. If caused by narrow nasal passages or nasal polyps, you may need to have surgery. Follow these instructions at home: Medicines Take, use, or apply over-the-counter and prescription medicines only as told by your health care provider. These may include nasal sprays. If you were prescribed an antibiotic medicine, take it as told by your health care provider. Do not stop taking the antibiotic even if you start to feel better. Hydrate and humidify  Drink enough fluid to keep your urine pale yellow. Staying hydrated will help to thin your mucus. Use a cool mist humidifier to keep the humidity level in your home above 50%. Inhale steam for 10-15 minutes,  3-4 times a day, or as told by your health care provider. You can do this in the bathroom while a hot shower is running. Limit your exposure to cool or dry air. Rest Rest as much as possible. Sleep with  your head raised (elevated). Make sure you get enough sleep each night. General instructions  Apply a warm, moist washcloth to your face 3-4 times a day or as told by your health care provider. This will help with discomfort. Wash your hands often with soap and water to reduce your exposure to germs. If soap and water are not available, use hand sanitizer. Do not smoke. Avoid being around people who are smoking (secondhand smoke). Keep all follow-up visits as told by your health care provider. This is important. Contact a health care provider if: You have a fever. Your symptoms get worse. Your symptoms do not improve within 10 days. Get help right away if: You have a severe headache. You have persistent vomiting. You have severe pain or swelling around your face or eyes. You have vision problems. You develop confusion. Your neck is stiff. You have trouble breathing. Summary Sinusitis is soreness and inflammation of your sinuses. Sinuses are hollow spaces in the bones around your face. This condition is caused by nasal tissues that become inflamed or swollen. The swelling traps or blocks the flow of mucus. This allows bacteria, viruses, and fungi to grow, which leads to infection. If you were prescribed an antibiotic medicine, take it as told by your health care provider. Do not stop taking the antibiotic even if you start to feel better. Keep all follow-up visits as told by your health care provider. This is important. This information is not intended to replace advice given to you by your health care provider. Make sure you discuss any questions you have with your health care provider. Document Revised: 01/13/2018 Document Reviewed: 01/13/2018 Elsevier Patient Education  2022 [place]. Meloxicam Capsules What is this medication? MELOXICAM (mel OX i cam) treats mild to moderate pain, inflammation, or arthritis. It works by decreasing inflammation. It belongs to a group of  medications called NSAIDs. This medicine may be used for other purposes; ask your health care provider or pharmacist if you have questions. COMMON BRAND NAME(S): Vivlodex What should I tell my care team before I take this medication? They need to know if you have any of these conditions: Asthma (lung or breathing disease) Bleeding disorder Coronary artery bypass graft (CABG) within the past 2 weeks Dehydration Heart attack Heart disease Heart failure High blood pressure If you often drink alcohol Kidney disease Liver disease Smoke tobacco cigarettes Stomach bleeding Stomach ulcers, other stomach or intestine problems Take medications that treat or prevent blood clots Taking other steroids like dexamethasone or prednisone An unusual or allergic reaction to meloxicam, other medications, foods, dyes, or preservatives Pregnant or trying to get pregnant Breast-feeding How should I use this medication? Take this medication by mouth. Take it as directed on the prescription label at the same time every day. You can take it with or without food. If it upsets your stomach, take it with food. Do not use it more often than directed. There may be unused or extra doses in the bottle after you finish your treatment. Talk to your care team if you have questions about your dose. A special MedGuide will be given to you by the pharmacist with each prescription and refill. Be sure to read this information carefully each time. Talk to your care  team about the use of this medication in children. Special care may be needed. Patients over 40 years of age may have a stronger reaction and need a smaller dose. Overdosage: If you think you have taken too much of this medicine contact a poison control center or emergency room at once. NOTE: This medicine is only for you. Do not share this medicine with others. What if I miss a dose? If you miss a dose, take it as soon as you can. If it is almost time for your  next dose, take only that dose. Do not take double or extra doses. What may interact with this medication? Do not take this medication with any of the following: Cidofovir Ketorolac This medication may also interact with the following: Aspirin and aspirin-like medications Certain medications for blood pressure, heart disease, irregular heart beat Certain medications for depression, anxiety, or psychotic disturbances Certain medications that treat or prevent blood clots like warfarin, enoxaparin, dalteparin, apixaban, dabigatran, rivaroxaban Cyclosporine Diuretics Fluconazole Lithium Methotrexate Other NSAIDs, medications for pain and inflammation, like ibuprofen and naproxen Pemetrexed This list may not describe all possible interactions. Give your health care provider a list of all the medicines, herbs, non-prescription drugs, or dietary supplements you use. Also tell them if you smoke, drink alcohol, or use illegal drugs. Some items may interact with your medicine. What should I watch for while using this medication? Visit your care team for regular checks on your progress. Tell your care team if your symptoms do not start to get better or if they get worse. Do not take other medications that contain aspirin, ibuprofen, or naproxen with this medication. Side effects such as stomach upset, nausea, or ulcers may be more likely to occur. Many non-prescription medications contain aspirin, ibuprofen, or naproxen. Always read labels carefully. This medication can cause serious ulcers and bleeding in the stomach. It can happen with no warning. Smoking, drinking alcohol, older age, and poor health can also increase risks. Call your care team right away if you have stomach pain or blood in your vomit or stool. This medication does not prevent a heart attack or stroke. This medication may increase the chance of a heart attack or stroke. The chance may increase the longer you use this medication or if  you have heart disease. If you take aspirin to prevent a heart attack or stroke, talk to your care team about using this medication. Alcohol may interfere with the effect of this medication. Avoid alcoholic drinks. This medication may cause serious skin reactions. They can happen weeks to months after starting the medication. Contact your care team right away if you notice fevers or flu-like symptoms with a rash. The rash may be red or purple and then turn into blisters or peeling of the skin. Or, you might notice a red rash with swelling of the face, lips or lymph nodes in your neck or under your arms. Talk to your care team if you are pregnant before taking this medication. Taking this medication between weeks 20 and 30 of pregnancy may harm your unborn baby. Your care team will monitor you closely if you need to take it. After 30 weeks of pregnancy, do not take this medication. You may get drowsy or dizzy. Do not drive, use machinery, or do anything that needs mental alertness until you know how this medication affects you. Do not stand up or sit up quickly, especially if you are an older patient. This reduces the risk of dizzy or fainting  spells. Be careful brushing or flossing your teeth or using a toothpick because you may get an infection or bleed more easily. If you have any dental work done, tell your dentist you are receiving this medication. This medication may make it more difficult to get pregnant. Talk to your care team if you are concerned about your fertility. What side effects may I notice from receiving this medication? Side effects that you should report to your care team as soon as possible: Allergic reactions--skin rash, itching, hives, swelling of the face, lips, tongue, or throat Bleeding--bloody or black, tar-like stools, vomiting blood or brown material that looks like coffee grounds, red or dark brown urine, small red or purple spots on skin, unusual bruising or bleeding Heart  attack--pain or tightness in the chest, shoulders, arms, or jaw, nausea, shortness of breath, cold or clammy skin, feeling faint or lightheaded Heart failure--shortness of breath, swelling of ankles, feet, or hands, sudden weight gain, unusual weakness or fatigue Increase in blood pressure Kidney injury--decrease in the amount of urine, swelling of the ankles, hands, or feet Liver injury--right upper belly pain, loss of appetite, nausea, light-colored stool, dark yellow or brown urine, yellowing skin or eyes, unusual weakness or fatigue Rash, fever, and swollen lymph nodes Redness, blistering, peeling, or loosening of the skin, including inside the mouth Stroke--sudden numbness or weakness of the face, arm, or leg, trouble speaking, confusion, trouble walking, loss of balance or coordination, dizziness, severe headache, change in vision Side effects that usually do not require medical attention (report to your care team if they continue or are bothersome): Diarrhea Nausea Upset stomach This list may not describe all possible side effects. Call your doctor for medical advice about side effects. You may report side effects to FDA at 1-800-FDA-1088. Where should I keep my medication? Keep out of the reach of children and pets. Store at room temperature between 20 and 25 degrees C (68 and 77 degrees F). Keep this medication in the original container. Protect from moisture. Keep the container tightly closed. Get rid of any unused medication after the expiration date. To get rid of medications that are no longer needed or have expired: Take the medication to a medication take-back program. Check with your pharmacy or law enforcement to find a location. If you cannot return the medication, check the label or package insert to see if the medication should be thrown out in the garbage or flushed down the toilet. If you are not sure, ask your care team. If it is safe to put it in the trash, empty the  medication out of the container. Mix the medication with cat litter, dirt, coffee grounds, or other unwanted substance. Seal the mixture in a bag or container. Put it in the trash. NOTE: This sheet is a summary. It may not cover all possible information. If you have questions about this medicine, talk to your doctor, pharmacist, or health care provider.  2022 Elsevier/Gold Standard (2020-11-09 00:00:00) Hip Pain The hip is the joint between the upper legs and the lower pelvis. The bones, cartilage, tendons, and muscles of your hip joint support your body and allow you to move around. Hip pain can range from a minor ache to severe pain in one or both of your hips. The pain may be felt on the inside of the hip joint near the groin, or on the outside near the buttocks and upper thigh. You may also have swelling or stiffness in your hip area. Follow these instructions  at home: Managing pain, stiffness, and swelling   If directed, put ice on the painful area. To do this: Put ice in a plastic bag. Place a towel between your skin and the bag. Leave the ice on for 20 minutes, 2-3 times a day. If directed, apply heat to the affected area as often as told by your health care provider. Use the heat source that your health care provider recommends, such as a moist heat pack or a heating pad. Place a towel between your skin and the heat source. Leave the heat on for 20-30 minutes. Remove the heat if your skin turns bright red. This is especially important if you are unable to feel pain, heat, or cold. You may have a greater risk of getting burned. Activity Do exercises as told by your health care provider. Avoid activities that cause pain. General instructions  Take over-the-counter and prescription medicines only as told by your health care provider. Keep a journal of your symptoms. Write down: How often you have hip pain. The location of your pain. What the pain feels like. What makes the pain  worse. Sleep with a pillow between your legs on your most comfortable side. Keep all follow-up visits as told by your health care provider. This is important. Contact a health care provider if: You cannot put weight on your leg. Your pain or swelling continues or gets worse after one week. It gets harder to walk. You have a fever. Get help right away if: You fall. You have a sudden increase in pain and swelling in your hip. Your hip is red or swollen or very tender to touch. Summary Hip pain can range from a minor ache to severe pain in one or both of your hips. The pain may be felt on the inside of the hip joint near the groin, or on the outside near the buttocks and upper thigh. Avoid activities that cause pain. Write down how often you have hip pain, the location of the pain, what makes it worse, and what it feels like. This information is not intended to replace advice given to you by your health care provider. Make sure you discuss any questions you have with your health care provider. Document Revised: 12/29/2018 Document Reviewed: 12/29/2018 Elsevier Patient Education  [place].

## 2021-08-15 NOTE — Addendum Note (Signed)
Addended by: Baron Hamper on: 08/15/2021 02:15 PM   Modules accepted: Orders

## 2021-08-15 NOTE — Telephone Encounter (Signed)
Pt was seen today 12/20. Pt and provider discussed refilling her vitamin D prescription. Pt needs a new RX sent into [facility]

## 2021-08-16 LAB — URINE CULTURE
MICRO NUMBER:: 12780099
Result:: NO GROWTH
SPECIMEN QUALITY:: ADEQUATE

## 2021-08-17 ENCOUNTER — Telehealth: Payer: Self-pay

## 2021-08-17 ENCOUNTER — Other Ambulatory Visit: Payer: Self-pay | Admitting: Adult Health

## 2021-08-17 DIAGNOSIS — E559 Vitamin D deficiency, unspecified: Secondary | ICD-10-CM

## 2021-08-17 MED ORDER — VITAMIN D (ERGOCALCIFEROL) 1.25 MG (50000 UNIT) PO CAPS: 50000.0000 [IU] | ORAL_CAPSULE | ORAL | 0 refills | Status: DC

## 2021-08-17 NOTE — Telephone Encounter (Signed)
LMTCB in regards to lab results.  

## 2021-08-17 NOTE — Progress Notes (Signed)
No growth on urine culture. Increase fluids and if any sympytoms persist return for further evaluation at anytime.

## 2021-08-17 NOTE — Telephone Encounter (Signed)
Sent to pharmacy she does have future orders in to have it rechecked when she completes vitamin D at lab.

## 2021-09-01 NOTE — Progress Notes (Addendum)
Office Visit Note  Patient: Megan Harper             Date of Birth: [birth year]           MRN: [id number]             PCP: Doreen Beam, FNP Referring: Sharmon Leyden* Visit Date: 09/14/2021 Occupation: _0 @  Subjective:  Pain in multiple joints and muscles.   History of Present Illness: Megan Harper is a 61 y.o. female seen in consultation per request of her PCP.  According the patient she started having lower back pain in 19 years.  She states in 1998 she was told that she had degenerative changes in her lumbar spine.  She has had off-and-on discomfort in her lower back since then.  She states her right hip joint used to catch off and on well she will walk.  She also had discomfort in the left shoulder joint off and on.  In April 2022 she had a tick bite which she gets quite often but never developed a rash.  In October 2022 she started experiencing increased pain in multiple joints.  She complains of discomfort in her neck, thoracic, lumbar region, bilateral shoulders, left elbow, both wrists, both hands, both hips, right knee.  She has not seen any joint swelling.  There is no family history of autoimmune disease.  She is gravida 3, para 3, miscarriages 0.  There is no history of DVTs.  She was evaluated by Dr. Maxie Better for joint pain and no surgery was suggested.  She also had x-rays of her lumbar spine and her hip joints by her PCP.  She gives history of IBS.  Activities of Daily Living:  Patient reports morning stiffness for all day.  Patient Reports nocturnal pain.  Difficulty dressing/grooming: Reports Difficulty climbing stairs: Reports Difficulty getting out of chair: Reports Difficulty using hands for taps, buttons, cutlery, and/or writing: Reports  Review of Systems  Constitutional:  Positive for fatigue.  HENT:  Positive for mouth dryness and nose dryness. Negative for mouth sores.   Eyes:  Positive for dryness. Negative for pain and itching.   Respiratory:  Positive for shortness of breath and difficulty breathing.   Cardiovascular:  Positive for palpitations. Negative for chest pain.  Gastrointestinal:  Positive for constipation. Negative for blood in stool and diarrhea.  Endocrine: Positive for increased urination.  Genitourinary:  Negative for difficulty urinating.  Musculoskeletal:  Positive for joint pain, joint pain, joint swelling, myalgias, morning stiffness, muscle tenderness and myalgias.  Skin:  Negative for color change, rash, redness and sensitivity to sunlight.  Allergic/Immunologic: Positive for susceptible to infections.  Neurological:  Positive for numbness, parasthesias and weakness. Negative for dizziness, headaches and memory loss.  Hematological:  Negative for bruising/bleeding tendency.  Psychiatric/Behavioral:  Positive for sleep disturbance. Negative for confusion. The patient is nervous/anxious.    PMFS History:  Patient Active Problem List   Diagnosis Date Noted   Arthralgia 07/17/2021   Lumbar pain 04/04/2021   ASCUS of cervix with negative high risk HPV 03/15/2021   Cellulitis of skin- left ear lobe  01/26/2021   Contact dermatitis 01/26/2021   Itching 01/26/2021   Abnormal cervical Papanicolaou smear 01/26/2021   Tick bite of lower back 01/26/2021   Screening for cervical cancer 12/14/2020   Need for tetanus booster 12/14/2020   Need for hepatitis C screening test 12/14/2020   B12 deficiency 12/14/2020   Hyperlipidemia 12/14/2020   Abdominal bloating 12/14/2020  Chronic LLQ pain 12/14/2020   Actinic keratoses 12/14/2020   Hx of moderate sun exposure 12/14/2020   Adnexal tenderness, left 12/14/2020   Fatigue 12/14/2020   Recurrent acute serous otitis media of left ear 11/16/2020   Vitamin D insufficiency 11/16/2020   H/O partial thyroidectomy- right side.  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   Former smoker 06/29/2020   History of COVID-19 06/29/2020   Medication management 06/29/2020   Annual physical exam 06/29/2020   History of thyroid nodule 08/12/2016   Dyspnea 10/28/2015   Upper airway cough syndrome 10/28/2015    Past Medical History:  Diagnosis Date   Anxiety    Cervical dysplasia    COPD (chronic obstructive pulmonary disease) (HCC)    Emphysema of lung (HCC)    Endometriosis    Fibrocystic breast disease    GERD (gastroesophageal reflux disease)    Osteoporosis    Thyroid nodule     Family History  Problem Relation Age of Onset   Uterine cancer Mother    Lung cancer Father        smoked   Kidney cancer Maternal Uncle    Heart disease Maternal Uncle    Heart disease Maternal Grandmother    Hypertension Maternal Grandmother    Thyroid disease Maternal Grandmother    Hypertension Maternal Grandfather    Colon cancer Maternal Grandfather    Kidney cancer Maternal Grandfather    Heart disease Paternal Grandmother    Diabetes Paternal Grandmother    Past Surgical History:  Procedure Laterality Date   laproscopy for endometriosis     THYROID LOBECTOMY Right 03/08/2010   TUBAL LIGATION  01/23/1986   Social History   Social History Narrative   Not on file   Immunization History  Administered Date(s) Administered   DTaP 09/01/1962, 10/20/1962, 12/23/1962, 04/20/1968   IPV 09/01/1962, 10/20/1962, 04/20/1968   Measles 01/07/1970   Pneumococcal-Unspecified 05/27/2014   Tdap 12/09/1980, 12/14/2020     Objective: Vital Signs: BP 132/73 (BP Location: Right Arm, Patient Position: Sitting, Cuff Size: Large)    Pulse 64    Ht _0  (1.676 m)    Wt 184 lb 3.2 oz (83.6 kg)    BMI 29.73 kg/m    Physical Exam Vitals and nursing note reviewed.  Constitutional:      Appearance: She is well-developed.  HENT:     Head: Normocephalic and atraumatic.   Eyes:     Conjunctiva/sclera: Conjunctivae normal.  Cardiovascular:     Rate and Rhythm: Normal rate and regular rhythm.     Heart sounds: Normal heart sounds.  Pulmonary:     Effort: Pulmonary effort is normal.     Breath sounds: Normal breath sounds.  Abdominal:     General: Bowel sounds are normal.     Palpations: Abdomen is soft.  Musculoskeletal:     Cervical back: Normal range of motion.  Lymphadenopathy:     Cervical: No cervical adenopathy.  Skin:    General: Skin is warm and dry.     Capillary Refill: Capillary refill takes less than 2 seconds.  Neurological:     Mental Status: She is alert and oriented to person, place, and time.  Psychiatric:        Behavior: Behavior normal.     Musculoskeletal Exam: C-spine was in good range of  motion with some stiffness.  Shoulder joints were in good range of motion with some discomfort.  She had bilateral trapezius spasm.  There was no tenderness over thoracic or lumbar region.  She has some tenderness in the gluteal region.  Elbow joints, wrist joints, MCPs PIPs and DIPs with good range of motion.  She had bilateral PIP and DIP thickening with no synovitis.  Hip joints were in good range of motion.  She had tenderness over bilateral trochanteric bursa.  She had good range of motion of bilateral knee joints without any warmth swelling or effusion.  There was no tenderness over ankles or MTPs.  Some osteoarthritic changes were noted in the PIP and DIP joints.  CDAI Exam: CDAI Score: -- Patient Global: --; Provider Global: -- Swollen: --; Tender: -- Joint Exam 09/14/2021   No joint exam has been documented for this visit   There is currently no information documented on the homunculus. Go to the Rheumatology activity and complete the homunculus joint exam.  Investigation: No additional findings.  Imaging: No results found.  Recent Labs: Lab Results  Component Value Date   WBC 6.9 07/12/2021   HGB 14.0 07/12/2021   PLT 295  07/12/2021   NA 141 07/12/2021   K 3.9 07/12/2021   CL 101 07/12/2021   CO2 27 07/12/2021   GLUCOSE 78 07/12/2021   BUN 8 07/12/2021   CREATININE 0.82 07/12/2021   BILITOT 0.4 07/12/2021   ALKPHOS 93 07/12/2021   AST 17 07/12/2021   ALT 16 07/12/2021   PROT 6.7 07/12/2021   ALBUMIN 4.5 07/12/2021   CALCIUM 9.4 07/12/2021   GFRAA  03/02/2010    >60        The eGFR has been calculated using the MDRD equation. This calculation has not been validated in all clinical situations. eGFR's persistently <60 mL/min signify possible Chronic Kidney Disease.   July 12, 2021 Lyme IgM negative July 12, 2021 x-ray of the lumbar spine showed mild degenerative changes. July 12, 2021 x-ray of bilateral hips which were read as unremarkable.   Speciality Comments: No specialty comments available.  Procedures:  No procedures performed Allergies: Cefaclor and Pantoprazole   Assessment / Plan:     Visit Diagnoses: Polyarthralgia -she complains of pain in multiple joints over several years.  She also complains of muscle pain.  She had extensive work-up in the past by orthopedic surgeon.  She had labs done by her PCP in November which we reviewed.  All autoimmune work-up was negative.  07/12/21: ANA negative, ESR 30, RF<10, TSH 3.530   Neck pain-she complains of pain and discomfort in her cervical spine.  She had a stiffness with range of motion of her cervical spine.  Per her request x-ray of the cervical spine was obtained.  X-rays of the cervical spine were unremarkable.  X-ray findings were discussed with the patient.  A handout on neck  exercises was given.  Chronic midline low back pain without sciatica-she complains of lower back pain for many years.  She was evaluated by Dr. Maxie Better recently.  I also reviewed her x-rays of the lumbar spine from August 11, 2021 which showed only mild degenerative changes.  A handout on back exercises was given.  Trochanteric bursitis of both  hips-she had tenderness on palpation of her bilateral trochanteric bursa consistent with trochanteric bursitis.  I offered physical therapy which she declined.  She had x-rays of her hip joints in November 2022 which were unremarkable.  A handout on  IT band stretches was given.  A handout on IT band exercises was given.  Primary osteoarthritis of both hands-she complains of discomfort in her bilateral hands.  No synovitis was noted.  She had bilateral PIP and DIP thickening consistent with osteoarthritis.  Joint protection muscle strengthening was discussed.  Myalgia -she complains of pain in all of her muscles.  She also feels tightness all over her body.  I will obtain CK.  Plan: CK.  I am concerned that she may have underlying fibromyalgia.  She gives history of generalized pain, she had hyperalgesia and positive tender points.  She also gives history of chronic insomnia and fatigue.  I offered physical therapy referral which she declined.  She may benefit from water aerobics, swimming and stretching.  Other fatigue -she gives history of fatigue for many years.  She states that her endocrinologist was suspicious of chronic fatigue syndrome.  Plan: Serum protein electrophoresis with reflex  Other medical problems are listed as follows:  Palpitations  History of hyperlipidemia  History of gastroesophageal reflux (GERD)  COPD with chronic bronchitis and emphysema ([place])  H/O partial thyroidectomy- right side.   History of hypothyroidism  History of IBS - Followed by Dr.Perry.  Vitamin D insufficiency  B12 deficiency  Actinic keratoses  Former smoker - 2PPD X 12 years . She quit smoking in 2011.  Orders: Orders Placed This Encounter  Procedures   XR Cervical Spine 2 or 3 views   CK   Serum protein electrophoresis with reflex   No orders of the defined types were placed in this encounter.    Follow-Up Instructions: Return for Pain in multiple joints and muscles.   Bo Merino, MD  Note - This record has been created using Editor, commissioning.  Chart creation errors have been sought, but may not always  have been located. Such creation errors do not reflect on  the standard of medical care.

## 2021-09-14 ENCOUNTER — Ambulatory Visit: Payer: Self-pay

## 2021-09-14 ENCOUNTER — Other Ambulatory Visit: Payer: Self-pay

## 2021-09-14 ENCOUNTER — Encounter: Payer: Self-pay | Admitting: Rheumatology

## 2021-09-14 ENCOUNTER — Ambulatory Visit (INDEPENDENT_AMBULATORY_CARE_PROVIDER_SITE_OTHER): Payer: 59 | Admitting: Rheumatology

## 2021-09-14 VITALS — BP 132/73 | HR 64 | Ht 66.0 in | Wt 184.2 lb

## 2021-09-14 DIAGNOSIS — R5383 Other fatigue: Secondary | ICD-10-CM

## 2021-09-14 DIAGNOSIS — E538 Deficiency of other specified B group vitamins: Secondary | ICD-10-CM

## 2021-09-14 DIAGNOSIS — Z87891 Personal history of nicotine dependence: Secondary | ICD-10-CM

## 2021-09-14 DIAGNOSIS — M545 Low back pain, unspecified: Secondary | ICD-10-CM

## 2021-09-14 DIAGNOSIS — M19041 Primary osteoarthritis, right hand: Secondary | ICD-10-CM

## 2021-09-14 DIAGNOSIS — E559 Vitamin D deficiency, unspecified: Secondary | ICD-10-CM

## 2021-09-14 DIAGNOSIS — M19042 Primary osteoarthritis, left hand: Secondary | ICD-10-CM

## 2021-09-14 DIAGNOSIS — M2550 Pain in unspecified joint: Secondary | ICD-10-CM | POA: Diagnosis not present

## 2021-09-14 DIAGNOSIS — J449 Chronic obstructive pulmonary disease, unspecified: Secondary | ICD-10-CM

## 2021-09-14 DIAGNOSIS — M7061 Trochanteric bursitis, right hip: Secondary | ICD-10-CM | POA: Diagnosis not present

## 2021-09-14 DIAGNOSIS — L570 Actinic keratosis: Secondary | ICD-10-CM

## 2021-09-14 DIAGNOSIS — M7062 Trochanteric bursitis, left hip: Secondary | ICD-10-CM

## 2021-09-14 DIAGNOSIS — M791 Myalgia, unspecified site: Secondary | ICD-10-CM | POA: Diagnosis not present

## 2021-09-14 DIAGNOSIS — G8929 Other chronic pain: Secondary | ICD-10-CM

## 2021-09-14 DIAGNOSIS — M542 Cervicalgia: Secondary | ICD-10-CM

## 2021-09-14 DIAGNOSIS — E890 Postprocedural hypothyroidism: Secondary | ICD-10-CM

## 2021-09-14 DIAGNOSIS — Z8719 Personal history of other diseases of the digestive system: Secondary | ICD-10-CM

## 2021-09-14 DIAGNOSIS — R002 Palpitations: Secondary | ICD-10-CM

## 2021-09-14 DIAGNOSIS — Z8639 Personal history of other endocrine, nutritional and metabolic disease: Secondary | ICD-10-CM

## 2021-09-14 DIAGNOSIS — R87610 Atypical squamous cells of undetermined significance on cytologic smear of cervix (ASC-US): Secondary | ICD-10-CM

## 2021-09-14 DIAGNOSIS — L0390 Cellulitis, unspecified: Secondary | ICD-10-CM

## 2021-09-14 DIAGNOSIS — Z8616 Personal history of COVID-19: Secondary | ICD-10-CM

## 2021-09-14 NOTE — Patient Instructions (Signed)
Iliotibial Band Syndrome Rehab Ask your health care provider which exercises are safe for you. Do exercises exactly as told by your health care provider and adjust them as directed. It is normal to feel mild stretching, pulling, tightness, or discomfort as you do these exercises. Stop right away if you feel sudden pain or your pain gets significantly worse. Do not begin these exercises until told by your health care provider. Stretching and range-of-motion exercises These exercises warm up your muscles and joints and improve the movement and flexibility of your hip and pelvis. Quadriceps stretch, prone  Lie on your abdomen (prone position) on a firm surface, such as a bed or padded floor. Bend your left / right knee and reach back to hold your ankle or pant leg. If you cannot reach your ankle or pant leg, loop a belt around your foot and grab the belt instead. Gently pull your heel toward your buttocks. Your knee should not slide out to the side. You should feel a stretch in the front of your thigh and knee (quadriceps). Hold this position for __________ seconds. Repeat __________ times. Complete this exercise __________ times a day. Iliotibial band stretch An iliotibial band is a strong band of muscle tissue that runs from the outer side of your hip to the outer side of your thigh and knee. Lie on your side with your left / right leg in the top position. Bend both of your knees and grab your left / right ankle. Stretch out your bottom arm to help you balance. Slowly bring your top knee back so your thigh goes behind your trunk. Slowly lower your top leg toward the floor until you feel a gentle stretch on the outside of your left / right hip and thigh. If you do not feel a stretch and your knee will not fall farther, place the heel of your other foot on top of your knee and pull your knee down toward the floor with your foot. Hold this position for __________ seconds. Repeat __________ times.  Complete this exercise __________ times a day. Strengthening exercises These exercises build strength and endurance in your hip and pelvis. Endurance is the ability to use your muscles for a long time, even after they get tired. Straight leg raises, side-lying This exercise strengthens the muscles that rotate the leg at the hip and move it away from your body (hip abductors). Lie on your side with your left / right leg in the top position. Lie so your head, shoulder, hip, and knee line up. You may bend your bottom knee to help you balance. Roll your hips slightly forward so your hips are stacked directly over each other and your left / right knee is facing forward. Tense the muscles in your outer thigh and lift your top leg 4-6 inches (10-15 cm). Hold this position for __________ seconds. Slowly lower your leg to return to the starting position. Let your muscles relax completely before doing another repetition. Repeat __________ times. Complete this exercise __________ times a day. Leg raises, prone This exercise strengthens the muscles that move the hips backward (hip extensors). Lie on your abdomen (prone position) on your bed or a firm surface. You can put a pillow under your hips if that is more comfortable for your lower back. Bend your left / right knee so your foot is straight up in the air. Squeeze your buttocks muscles and lift your left / right thigh off the bed. Do not let your back arch. Tense  your thigh muscle as hard as you can without increasing any knee pain. Hold this position for __________ seconds. Slowly lower your leg to return to the starting position and allow it to relax completely. Repeat __________ times. Complete this exercise __________ times a day. Hip hike Stand sideways on a bottom step. Stand on your left / right leg with your other foot unsupported next to the step. You can hold on to a railing or wall for balance if needed. Keep your knees straight and your  torso square. Then lift your left / right hip up toward the ceiling. Slowly let your left / right hip lower toward the floor, past the starting position. Your foot should get closer to the floor. Do not lean or bend your knees. Repeat __________ times. Complete this exercise __________ times a day. This information is not intended to replace advice given to you by your health care provider. Make sure you discuss any questions you have with your health care provider. Document Revised: 10/21/2019 Document Reviewed: 10/21/2019 Elsevier Patient Education  [place]. Back Exercises The following exercises strengthen the muscles that help to support the trunk (torso) and back. They also help to keep the lower back flexible. Doing these exercises can help to prevent or lessen existing low back pain. If you have back pain or discomfort, try doing these exercises 2-3 times each day or as told by your health care provider. As your pain improves, do them once each day, but increase the number of times that you repeat the steps for each exercise (do more repetitions). To prevent the recurrence of back pain, continue to do these exercises once each day or as told by your health care provider. Do exercises exactly as told by your health care provider and adjust them as directed. It is normal to feel mild stretching, pulling, tightness, or discomfort as you do these exercises, but you should stop right away if you feel sudden pain or your pain gets worse. Exercises Single knee to chest Repeat these steps 3-5 times for each leg: Lie on your back on a firm bed or the floor with your legs extended. Bring one knee to your chest. Your other leg should stay extended and in contact with the floor. Hold your knee in place by grabbing your knee or thigh with both hands and hold. Pull on your knee until you feel a gentle stretch in your lower back or buttocks. Hold the stretch for 10-30 seconds. Slowly release  and straighten your leg.  Pelvic tilt Repeat these steps 5-10 times: Lie on your back on a firm bed or the floor with your legs extended. Bend your knees so they are pointing toward the ceiling and your feet are flat on the floor. Tighten your lower abdominal muscles to press your lower back against the floor. This motion will tilt your pelvis so your tailbone points up toward the ceiling instead of pointing to your feet or the floor. With gentle tension and even breathing, hold this position for 5-10 seconds.  Cat-cow Repeat these steps until your lower Iliotibial Band Syndrome Rehab Ask your health care provider which exercises are safe for you. Do exercises exactly as told by your health care provider and adjust them as directed. It is normal to feel mild stretching, pulling, tightness, or discomfort as you do these exercises. Stop right away if you feel sudden pain or your pain gets significantly worse. Do not begin these exercises until told by your health  care provider. Stretching and range-of-motion exercises These exercises warm up your muscles and joints and improve the movement and flexibility of your hip and pelvis. Quadriceps stretch, prone  Lie on your abdomen (prone position) on a firm surface, such as a bed or padded floor. Bend your left / right knee and reach back to hold your ankle or pant leg. If you cannot reach your ankle or pant leg, loop a belt around your foot and grab the belt instead. Gently pull your heel toward your buttocks. Your knee should not slide out to the side. You should feel a stretch in the front of your thigh and knee (quadriceps). Hold this position for __________ seconds. Repeat __________ times. Complete this exercise __________ times a day. Iliotibial band stretch An iliotibial band is a strong band of muscle tissue that runs from the outer side of your hip to the outer side of your thigh and knee. Lie on your side with your left / right leg in the  top position. Bend both of your knees and grab your left / right ankle. Stretch out your bottom arm to help you balance. Slowly bring your top knee back so your thigh goes behind your trunk. Slowly lower your top leg toward the floor until you feel a gentle stretch on the outside of your left / right hip and thigh. If you do not feel a stretch and your knee will not fall farther, place the heel of your other foot on top of your knee and pull your knee down toward the floor with your foot. Hold this position for __________ seconds. Repeat __________ times. Complete this exercise __________ times a day. Strengthening exercises These exercises build strength and endurance in your hip and pelvis. Endurance is the ability to use your muscles for a long time, even after they get tired. Straight leg raises, side-lying This exercise strengthens the muscles that rotate the leg at the hip and move it away from your body (hip abductors). Lie on your side with your left / right leg in the top position. Lie so your head, shoulder, hip, and knee line up. You may bend your bottom knee to help you balance. Roll your hips slightly forward so your hips are stacked directly over each other and your left / right knee is facing forward. Tense the muscles in your outer thigh and lift your top leg 4-6 inches (10-15 cm). Hold this position for __________ seconds. Slowly lower your leg to return to the starting position. Let your muscles relax completely before doing another repetition. Repeat __________ times. Complete this exercise __________ times a day. Leg raises, prone This exercise strengthens the muscles that move the hips backward (hip extensors). Lie on your abdomen (prone position) on your bed or a firm surface. You can put a pillow under your hips if that is more comfortable for your lower back. Bend your left / right knee so your foot is straight up in the air. Squeeze your buttocks muscles and lift your  left / right thigh off the bed. Do not let your back arch. Tense your thigh muscle as hard as you can without increasing any knee pain. Hold this position for __________ seconds. Slowly lower your leg to return to the starting position and allow it to relax completely. Repeat __________ times. Complete this exercise __________ times a day. Hip hike Stand sideways on a bottom step. Stand on your left / right leg with your other foot unsupported next to the step.  You can hold on to a railing or wall for balance if needed. Keep your knees straight and your torso square. Then lift your left / right hip up toward the ceiling. Slowly let your left / right hip lower toward the floor, past the starting position. Your foot should get closer to the floor. Do not lean or bend your knees. Repeat __________ times. Complete this exercise __________ times a day. This information is not intended to replace advice given to you by your health care provider. Make sure you discuss any questions you have with your health care provider. Document Revised: 10/21/2019 Document Reviewed: 10/21/2019 Elsevier Patient Education  2022 [place]. back becomes more flexible: Get into a hands-and-knees position on a firm bed or the floor. Keep your hands under your shoulders, and keep your knees under your hips. You may place padding under your knees for comfort. Let your head hang down toward your chest. Contract your abdominal muscles and point your tailbone toward the floor so your lower back becomes rounded like the back of a cat. Hold this position for 5 seconds. Slowly lift your head, let your abdominal muscles relax, and point your tailbone up toward the ceiling so your back forms a sagging arch like the back of a cow. Hold this position for 5 seconds.  Press-ups Repeat these steps 5-10 times: Lie on your abdomen (face-down) on a firm bed or the floor. Place your palms near your head, about shoulder-width  apart. Keeping your back as relaxed as possible and keeping your hips on the floor, slowly straighten your arms to raise the top half of your body and lift your shoulders. Do not use your back muscles to raise your upper torso. You may adjust the placement of your hands to make yourself more comfortable. Hold this position for 5 seconds while you keep your back relaxed. Slowly return to lying flat on the floor.  Bridges Repeat these steps 10 times: Lie on your back on a firm bed or the floor. Bend your knees so they are pointing toward the ceiling and your feet are flat on the floor. Your arms should be flat at your sides, next to your body. Tighten your buttocks muscles and lift your buttocks off the floor until your waist is at almost the same height as your knees. You should feel the muscles working in your buttocks and the back of your thighs. If you do not feel these muscles, slide your feet 1-2 inches (2.5-5 cm) farther away from your buttocks. Hold this position for 3-5 seconds. Slowly lower your hips to the starting position, and allow your buttocks muscles to relax completely. If this exercise is too easy, try doing it with your arms crossed over your chest. Abdominal crunches Repeat these steps 5-10 times: Lie on your back on a firm bed or the floor with your legs extended. Bend your knees so they are pointing toward the ceiling and your feet are flat on the floor. Cross your arms over your chest. Tip your chin slightly toward your chest without bending your neck. Tighten your abdominal muscles and slowly raise your torso high enough to lift your shoulder blades a tiny bit off the floor. Avoid raising your torso higher than that because it can put too much stress on your lower back and does not help to strengthen your abdominal muscles. Slowly return to your starting position.  Back lifts Repeat these steps 5-10 times: Lie on your abdomen (face-down) with your arms at  your sides,  and rest your forehead on the floor. Tighten the muscles in your legs and your buttocks. Slowly lift your chest off the floor while you keep your hips pressed to the floor. Keep the back of your head in line with the curve in your back. Your eyes should be looking at the floor. Hold this position for 3-5 seconds. Slowly return to your starting position.  Contact a health care provider if: Your back pain or discomfort gets much worse when you do an exercise. Your worsening back pain or discomfort does not lessen within 2 hours after you exercise. If you have any of these problems, stop doing these exercises right away. Do not do them again unless your health care provider says that you can. Get help right away if: You develop sudden, severe back pain. If this happens, stop doing the exercises right away. Do not do them again unless your health care provider says that you can. This information is not intended to replace advice given to you by your health care provider. Make sure you discuss any questions you have with your health care provider. Document Revised: 02/07/2021 Document Reviewed: 10/26/2020 Elsevier Patient Education  2022 [place]. Cervical Strain and Sprain Rehab Ask your health care provider which exercises are safe for you. Do exercises exactly as told by your health care provider and adjust them as directed. It is normal to feel mild stretching, pulling, tightness, or discomfort as you do these exercises. Stop right away if you feel sudden pain or your pain gets worse. Do not begin these exercises until told by your health care provider. Stretching and range-of-motion exercises Cervical side bending  Using good posture, sit on a stable chair or stand up. Without moving your shoulders, slowly tilt your left / right ear to your shoulder until you feel a stretch in the opposite side neck muscles. You should be looking straight ahead. Hold for __________ seconds. Repeat with  the other side of your neck. Repeat __________ times. Complete this exercise __________ times a day. Cervical rotation  Using good posture, sit on a stable chair or stand up. Slowly turn your head to the side as if you are looking over your left / right shoulder. Keep your eyes level with the ground. Stop when you feel a stretch along the side and the back of your neck. Hold for __________ seconds. Repeat this by turning to your other side. Repeat __________ times. Complete this exercise __________ times a day. Thoracic extension and pectoral stretch Roll a towel or a small blanket so it is about 4 inches (10 cm) in diameter. Lie down on your back on a firm surface. Put the towel lengthwise, under your spine in the middle of your back. It should not be under your shoulder blades. The towel should line up with your spine from your middle back to your lower back. Put your hands behind your head and let your elbows fall out to your sides. Hold for __________ seconds. Repeat __________ times. Complete this exercise __________ times a day. Strengthening exercises Isometric upper cervical flexion Lie on your back with a thin pillow behind your head and a small rolled-up towel under your neck. Gently tuck your chin toward your chest and nod your head down to look toward your feet. Do not lift your head off the pillow. Hold for __________ seconds. Release the tension slowly. Relax your neck muscles completely before you repeat this exercise. Repeat __________ times. Complete this exercise __________  times a day. Isometric cervical extension  Stand about 6 inches (15 cm) away from a wall, with your back facing the wall. Place a soft object, about 6-8 inches (15-20 cm) in diameter, between the back of your head and the wall. A soft object could be a small pillow, a ball, or a folded towel. Gently tilt your head back and press into the soft object. Keep your jaw and forehead relaxed. Hold for  __________ seconds. Release the tension slowly. Relax your neck muscles completely before you repeat this exercise. Repeat __________ times. Complete this exercise __________ times a day. Posture and body mechanics Body mechanics refers to the movements and positions of your body while you do your daily activities. Posture is part of body mechanics. Good posture and healthy body mechanics can help to relieve stress in your body's tissues and joints. Good posture means that your spine is in its natural S-curve position (your spine is neutral), your shoulders are pulled back slightly, and your head is not tipped forward. The following are general guidelines for applying improved posture and body mechanics to your everyday activities. Sitting  When sitting, keep your spine neutral and keep your feet flat on the floor. Use a footrest, if necessary, and keep your thighs parallel to the floor. Avoid rounding your shoulders, and avoid tilting your head forward. When working at a desk or a computer, keep your desk at a height where your hands are slightly lower than your elbows. Slide your chair under your desk so you are close enough to maintain good posture. When working at a computer, place your monitor at a height where you are looking straight ahead and you do not have to tilt your head forward or downward to look at the screen. Standing  When standing, keep your spine neutral and keep your feet about hip-width apart. Keep a slight bend in your knees. Your ears, shoulders, and hips should line up. When you do a task in which you stand in one place for a long time, place one foot up on a stable object that is 2-4 inches (5-10 cm) high, such as a footstool. This helps keep your spine neutral. Resting When lying down and resting, avoid positions that are most painful for you. Try to support your neck in a neutral position. You can use a contour pillow or a small rolled-up towel. Your pillow should support  your neck but not push on it. This information is not intended to replace advice given to you by your health care provider. Make sure you discuss any questions you have with your health care provider. Document Revised: 12/03/2018 Document Reviewed: 05/14/2018 Elsevier Patient Education  [place].

## 2021-09-15 NOTE — Progress Notes (Deleted)
Office Visit Note  Patient: Megan Harper             Date of Birth: 1961/01/12           MRN: [id number]             PCP: Berniece Pap, FNP Referring: Stephanie Acre* Visit Date: 09/28/2021 Occupation: @GUAROCC @  Subjective:  No chief complaint on file.   History of Present Illness: Megan Harper is a 61 y.o. female ***   Activities of Daily Living:  Patient reports morning stiffness for *** {minute/hour:19697}.   Patient {ACTIONS;DENIES/REPORTS:21021675::"Denies"} nocturnal pain.  Difficulty dressing/grooming: {ACTIONS;DENIES/REPORTS:21021675::"Denies"} Difficulty climbing stairs: {ACTIONS;DENIES/REPORTS:21021675::"Denies"} Difficulty getting out of chair: {ACTIONS;DENIES/REPORTS:21021675::"Denies"} Difficulty using hands for taps, buttons, cutlery, and/or writing: {ACTIONS;DENIES/REPORTS:21021675::"Denies"}  No Rheumatology ROS completed.   PMFS History:  Patient Active Problem List   Diagnosis Date Noted   Arthralgia 07/17/2021   Lumbar pain 04/04/2021   ASCUS of cervix with negative high risk HPV 03/15/2021   Cellulitis of skin- left ear lobe  01/26/2021   Contact dermatitis 01/26/2021   Itching 01/26/2021   Abnormal cervical Papanicolaou smear 01/26/2021   Tick bite of lower back 01/26/2021   Screening for cervical cancer 12/14/2020   Need for tetanus booster 12/14/2020   Need for hepatitis C screening test 12/14/2020   B12 deficiency 12/14/2020   Hyperlipidemia 12/14/2020   Abdominal bloating 12/14/2020   Chronic LLQ pain 12/14/2020   Actinic keratoses 12/14/2020   Hx of moderate sun exposure 12/14/2020   Adnexal tenderness, left 12/14/2020   Fatigue 12/14/2020   Recurrent acute serous otitis media of left ear 11/16/2020   Vitamin D insufficiency 11/16/2020   H/O partial thyroidectomy- right side.  11/16/2020   Palpitations 11/16/2020   Gastroesophageal reflux disease 11/16/2020   Hypothyroidism 11/16/2020   Menopausal symptom  11/16/2020   Seasonal allergies 11/16/2020   COPD with chronic bronchitis and emphysema (HCC) 06/29/2020   Former smoker 06/29/2020   History of COVID-19 06/29/2020   Medication management 06/29/2020   Annual physical exam 06/29/2020   History of thyroid nodule 08/12/2016   Dyspnea 10/28/2015   Upper airway cough syndrome 10/28/2015    Past Medical History:  Diagnosis Date   Anxiety    Cervical dysplasia    COPD (chronic obstructive pulmonary disease) (HCC)    Emphysema of lung (HCC)    Endometriosis    Fibrocystic breast disease    GERD (gastroesophageal reflux disease)    Osteoporosis    Thyroid nodule     Family History  Problem Relation Age of Onset   Uterine cancer Mother    Lung cancer Father        smoked   Kidney cancer Maternal Uncle    Heart disease Maternal Uncle    Heart disease Maternal Grandmother    Hypertension Maternal Grandmother    Thyroid disease Maternal Grandmother    Hypertension Maternal Grandfather    Colon cancer Maternal Grandfather    Kidney cancer Maternal Grandfather    Heart disease Paternal Grandmother    Diabetes Paternal Grandmother    Past Surgical History:  Procedure Laterality Date   laproscopy for endometriosis     THYROID LOBECTOMY Right 03/08/2010   TUBAL LIGATION  01/23/1986   Social History   Social History Narrative   Not on file   Immunization History  Administered Date(s) Administered   DTaP 09/01/1962, 10/20/1962, 12/23/1962, 04/20/1968   IPV 09/01/1962, 10/20/1962, 04/20/1968   Measles 01/07/1970   Pneumococcal-Unspecified 05/27/2014  Tdap 12/09/1980, 12/14/2020     Objective: Vital Signs: There were no vitals taken for this visit.   Physical Exam   Musculoskeletal Exam: ***  CDAI Exam: CDAI Score: -- Patient Global: --; Provider Global: -- Swollen: --; Tender: -- Joint Exam 09/28/2021   No joint exam has been documented for this visit   There is currently no information documented on the  homunculus. Go to the Rheumatology activity and complete the homunculus joint exam.  Investigation: No additional findings.  Imaging: XR Cervical Spine 2 or 3 views  Result Date: 09/14/2021 No significant disc space narrowing was noted.  No facet joint arthropathy was noted. Impression: Unremarkable x-ray of the cervical spine.   Recent Labs: Lab Results  Component Value Date   WBC 6.9 07/12/2021   HGB 14.0 07/12/2021   PLT 295 07/12/2021   NA 141 07/12/2021   K 3.9 07/12/2021   CL 101 07/12/2021   CO2 27 07/12/2021   GLUCOSE 78 07/12/2021   BUN 8 07/12/2021   CREATININE 0.82 07/12/2021   BILITOT 0.4 07/12/2021   ALKPHOS 93 07/12/2021   AST 17 07/12/2021   ALT 16 07/12/2021   PROT 6.7 07/12/2021   ALBUMIN 4.5 07/12/2021   CALCIUM 9.4 07/12/2021   GFRAA  03/02/2010    >60        The eGFR has been calculated using the MDRD equation. This calculation has not been validated in all clinical situations. eGFR's persistently <60 mL/min signify possible Chronic Kidney Disease.    September 14, 2021 CK 45, SPEP  07/12/21: ANA negative, ESR 30, RF<10, TSH 3.530    Speciality Comments: No specialty comments available.  Procedures:  No procedures performed Allergies: Cefaclor and Pantoprazole   Assessment / Plan:     Visit Diagnoses: Primary osteoarthritis of both hands - Clinical findings were consistent with osteoarthritis.  Joint protection was discussed.  Trochanteric bursitis of both hips - A handout on IT band stretches was given at the last visit.  Patient declined physical therapy.  Neck pain - History of chronic neck pain and discomfort.  X-rays were unremarkable.  A handout on exercises was given at the last visit.  DDD (degenerative disc disease), lumbar - History of chronic back pain.  X-rays of the lumbar spine reviewed at the last visit showed mild degenerative changes.  A handout on the exercises was given at   Myofascial pain - History of generalized  pain.  She had generalized hyperalgesia and positive tender points.  She was offered physical therapy referral which she declined.  Other fatigue - Patient was diagnosed with chronic fatigue syndrome in the past.  H/O partial thyroidectomy- right side.   History of hypothyroidism  Palpitations  History of hyperlipidemia  Vitamin D insufficiency  History of gastroesophageal reflux (GERD)  History of IBS - Followed by Dr.  Marina Goodell  B12 deficiency  COPD with chronic bronchitis and emphysema (HCC)  Actinic keratoses  Former smoker  Orders: No orders of the defined types were placed in this encounter.  No orders of the defined types were placed in this encounter.   Face-to-face time spent with patient was *** minutes. Greater than 50% of time was spent in counseling and coordination of care.  Follow-Up Instructions: No follow-ups on file.   Pollyann Savoy, MD  Note - This record has been created using Animal nutritionist.  Chart creation errors have been sought, but may not always  have been located. Such creation errors do not reflect on  the standard of medical care.

## 2021-09-19 ENCOUNTER — Encounter: Payer: Self-pay | Admitting: Adult Health

## 2021-09-19 ENCOUNTER — Other Ambulatory Visit (HOSPITAL_COMMUNITY)
Admission: RE | Admit: 2021-09-19 | Discharge: 2021-09-19 | Disposition: A | Discharge status: Home / Self Care | Payer: 59 | Source: Ambulatory Visit | Attending: Adult Health | Admitting: Adult Health

## 2021-09-19 ENCOUNTER — Ambulatory Visit (INDEPENDENT_AMBULATORY_CARE_PROVIDER_SITE_OTHER): Payer: 59 | Admitting: Adult Health

## 2021-09-19 ENCOUNTER — Other Ambulatory Visit: Payer: Self-pay

## 2021-09-19 VITALS — BP 128/74 | HR 61 | Temp 97.9°F | Resp 14 | Ht 66.0 in | Wt 184.0 lb

## 2021-09-19 DIAGNOSIS — N898 Other specified noninflammatory disorders of vagina: Secondary | ICD-10-CM | POA: Insufficient documentation

## 2021-09-19 DIAGNOSIS — R399 Unspecified symptoms and signs involving the genitourinary system: Secondary | ICD-10-CM | POA: Diagnosis not present

## 2021-09-19 LAB — POCT URINALYSIS DIPSTICK (MANUAL)
Leukocytes, UA: NEGATIVE
Nitrite, UA: NEGATIVE
Poct Bilirubin: NEGATIVE
Poct Blood: NEGATIVE
Poct Glucose: NORMAL mg/dL
Poct Ketones: NEGATIVE
Poct Protein: NEGATIVE mg/dL
Poct Urobilinogen: NORMAL mg/dL
Spec Grav, UA: <=1.005 — AB (ref 1.010–1.025)
pH, UA: 5.5 (ref 5.0–8.0)

## 2021-09-19 LAB — URINALYSIS, ROUTINE W REFLEX MICROSCOPIC
Bilirubin Urine: NEGATIVE
Hgb urine dipstick: NEGATIVE
Ketones, ur: NEGATIVE
Leukocytes,Ua: NEGATIVE
Nitrite: NEGATIVE
RBC / HPF: NONE SEEN (ref 0–?)
Specific Gravity, Urine: <=1.005 — AB (ref 1.000–1.030)
Total Protein, Urine: NEGATIVE
Urine Glucose: NEGATIVE
Urobilinogen, UA: 0.2 (ref 0.0–1.0)
WBC, UA: NONE SEEN (ref 0–?)
pH: 6 (ref 5.0–8.0)

## 2021-09-19 MED ORDER — CIPROFLOXACIN HCL 500 MG PO TABS: 500.0000 mg | ORAL_TABLET | Freq: Two times a day (BID) | ORAL | 0 refills | Status: DC

## 2021-09-19 MED ORDER — TERCONAZOLE 0.4 % VA CREA: 1.0000 | TOPICAL_CREAM | Freq: Every day | VAGINAL | 0 refills | Status: AC

## 2021-09-19 NOTE — Progress Notes (Signed)
Acute Office Visit  Subjective:    Patient ID: Megan Harper, female    DOB: 1960/12/29, 61 y.o.   MRN: [id number]  Chief Complaint  Patient presents with   Acute Visit    Pt presents with uti sx's c/o urgency,irritation,& pressure. Was seen back in December for same issue but hasn't gone away since. Was treated with Abx.     HPI Patient is in today for urinary pressure, vaginal itching and irritation.  She had UTI in December.  Has fishy odor occasionally.  Patient  denies any fever, body aches,chills, rash, chest pain, shortness of breath, nausea, vomiting, or diarrhea.  Denies dizziness, lightheadedness, pre syncopal or syncopal episodes.    Past Medical History:  Diagnosis Date   Anxiety    Cervical dysplasia    COPD (chronic obstructive pulmonary disease) (HCC)    Emphysema of lung (HCC)    Endometriosis    Fibrocystic breast disease    GERD (gastroesophageal reflux disease)    Osteoporosis    Thyroid nodule     Past Surgical History:  Procedure Laterality Date   laproscopy for endometriosis     THYROID LOBECTOMY Right 03/08/2010   TUBAL LIGATION  01/23/1986    Family History  Problem Relation Age of Onset   Uterine cancer Mother    Lung cancer Father        smoked   Kidney cancer Maternal Uncle    Heart disease Maternal Uncle    Heart disease Maternal Grandmother    Hypertension Maternal Grandmother    Thyroid disease Maternal Grandmother    Hypertension Maternal Grandfather    Colon cancer Maternal Grandfather    Kidney cancer Maternal Grandfather    Heart disease Paternal Grandmother    Diabetes Paternal Grandmother     Social History   Socioeconomic History   Marital status: Married    Spouse name: Not on file   Number of children: 3   Years of education: Not on file   Highest education level: Not on file  Occupational History   Occupation: customer advocate    Employer: [employer]  Tobacco Use   Smoking status: Former     Packs/day: 2.50    Years: 31.00    Pack years: 77.50    Types: Cigarettes    Quit date: 09/17/2009    Years since quitting: 12.0   Smokeless tobacco: Never  Vaping Use   Vaping Use: Never used  Substance and Sexual Activity   Alcohol use: No   Drug use: No   Sexual activity: Yes    Birth control/protection: Surgical  Other Topics Concern   Not on file  Social History Narrative   Not on file   Social Determinants of Health   Financial Resource Strain: Not on file  Food Insecurity: Not on file  Transportation Needs: Not on file  Physical Activity: Not on file  Stress: Not on file  Social Connections: Not on file  Intimate Partner Violence: Not on file    Outpatient Medications Prior to Visit  Medication Sig Dispense Refill   albuterol (PROVENTIL HFA;VENTOLIN HFA) 108 (90 Base) MCG/ACT inhaler Inhale 2 puffs into the lungs every 6 (six) hours as needed for wheezing or shortness of breath. 1 Inhaler 2   Cyanocobalamin (VITAMIN B-12 PO) Take by mouth daily.     fluticasone (FLONASE) 50 MCG/ACT nasal spray Place 2 sprays into both nostrils daily. (Patient taking differently: Place 2 sprays into both nostrils as needed.) 18.2 mL  2   meloxicam (MOBIC) 7.5 MG tablet Take 1 tablet (7.5 mg total) by mouth daily. 90 tablet 0   methocarbamol (ROBAXIN) 500 MG tablet      omeprazole (PRILOSEC) 20 MG capsule Take 1 capsule (20 mg total) by mouth daily. 30 capsule 11   triamcinolone cream (KENALOG) 0.1 % Apply 1 application topically 2 (two) times daily. (Patient taking differently: Apply 1 application topically as needed.) 30 g 0   Vitamin D, Ergocalciferol, (DRISDOL) 1.25 MG (50000 UNIT) CAPS capsule Take 1 capsule (50,000 Units total) by mouth every 7 (seven) days. (taking one tablet per week) schedule  lab in office 1-2 weeks after completing prescription. 12 capsule 0   No facility-administered medications prior to visit.    Allergies  Allergen Reactions   Cefaclor Other (See  Comments)    Flu like symptoms   Pantoprazole Itching    Review of Systems  Constitutional: Negative.   Respiratory: Negative.    Cardiovascular: Negative.   Gastrointestinal: Negative.   Genitourinary:  Positive for dysuria and vaginal discharge. Negative for vaginal bleeding.  Musculoskeletal:  Positive for arthralgias.  Skin: Negative.   Neurological: Negative.   Psychiatric/Behavioral: Negative.        Objective:    Physical Exam Genitourinary:    Exam position: Lithotomy position.     Labia:        Right: No rash, tenderness, lesion or injury.        Left: No rash, tenderness, lesion or injury.      Vagina: Vaginal discharge (white discharge) present.      General: Appearance:      female in no acute distress  Eyes:    PERRL, conjunctiva/corneas clear, EOM's intact       Lungs:     Clear to auscultation bilatWell developed, well nourishederally, respirations unlabored  Heart:    Normal heart rate. Normal rhythm. No murmurs, rubs, or gallops.    MS:   All extremities are intact.    Neurologic:   Awake, alert, oriented x 3. No apparent focal neurological           defect.     BP 128/74 (BP Location: Left Arm, Patient Position: Sitting, Cuff Size: Small)    Pulse 61    Temp 97.9 F (36.6 C) (Oral)    Resp 14    Ht _0  (1.676 m)    Wt 184 lb (83.5 kg)    SpO2 99%    BMI 29.70 kg/m  Wt Readings from Last 3 Encounters:  09/19/21 184 lb (83.5 kg)  09/14/21 184 lb 3.2 oz (83.6 kg)  08/15/21 179 lb 1.9 oz (81.2 kg)    Health Maintenance Due  Topic Date Due   Zoster Vaccines- Shingrix (1 of 2) Never done   INFLUENZA VACCINE  Never done    There are no preventive care reminders to display for this patient.   Lab Results  Component Value Date   TSH 3.530 07/12/2021   Lab Results  Component Value Date   WBC 6.9 07/12/2021   HGB 14.0 07/12/2021   HCT 41.7 07/12/2021   MCV 95 07/12/2021   PLT 295 07/12/2021   Lab Results  Component Value Date   NA 141  07/12/2021   K 3.9 07/12/2021   CO2 27 07/12/2021   GLUCOSE 78 07/12/2021   BUN 8 07/12/2021   CREATININE 0.82 07/12/2021   BILITOT 0.4 07/12/2021   ALKPHOS 93 07/12/2021   AST 17 07/12/2021  ALT 16 07/12/2021   PROT 6.9 09/14/2021   ALBUMIN 4.5 07/12/2021   CALCIUM 9.4 07/12/2021   EGFR 82 07/12/2021   GFR 93.75 07/23/2017   Lab Results  Component Value Date   CHOL 183 12/14/2020   Lab Results  Component Value Date   HDL 60 12/14/2020   Lab Results  Component Value Date   LDLCALC 112 (H) 12/14/2020   Lab Results  Component Value Date   TRIG 59 12/14/2020   No results found for: CHOLHDL No results found for: HGBA1C     Assessment & Plan:   Problem List Items Addressed This Visit       Musculoskeletal and Integument   Vaginal itching   Relevant Medications   terconazole (TERAZOL 7) 0.4 % vaginal cream   Other Relevant Orders   Cervicovaginal ancillary only( [facility]) (Completed)     Other   Urinary symptom or sign - Primary   Relevant Medications   ciprofloxacin (CIPRO) 500 MG tablet   Other Relevant Orders   POCT Urinalysis Dip Manual (Completed)   Urine Culture (Completed)   Urinalysis, Routine w reflex microscopic (Completed)     Meds ordered this encounter  Medications   ciprofloxacin (CIPRO) 500 MG tablet    Sig: Take 1 tablet (500 mg total) by mouth 2 (two) times daily.    Dispense:  14 tablet    Refill:  0   terconazole (TERAZOL 7) 0.4 % vaginal cream    Sig: Place 1 applicator vaginally at bedtime for 7 days.    Dispense:  45 g    Refill:  0    Red Flags discussed. The patient was given clear instructions to go to ER or return to medical center if any red flags develop, symptoms do not improve, worsen or new problems develop. They verbalized understanding.  Return in about 2 weeks (around 10/03/2021), or if symptoms worsen or fail to improve, for at any time for any worsening symptoms, Go to Emergency room/ urgent care if worse.    Marcille Buffy, FNP

## 2021-09-19 NOTE — Patient Instructions (Signed)
Urinary Tract Infection, Adult °A urinary tract infection (UTI) is an infection of any part of the urinary tract. The urinary tract includes: °The kidneys. °The ureters. °The bladder. °The urethra. °These organs make, store, and get rid of pee (urine) in the body. °What are the causes? °This infection is caused by germs (bacteria) in your genital area. These germs grow and cause swelling (inflammation) of your urinary tract. °What increases the risk? °The following factors may make you more likely to develop this condition: °Using a small, thin tube (catheter) to drain pee. °Not being able to control when you pee or poop (incontinence). °Being female. If you are female, these things can increase the risk: °Using these methods to prevent pregnancy: °A medicine that kills sperm (spermicide). °A device that blocks sperm (diaphragm). °Having low levels of a female hormone (estrogen). °Being pregnant. °You are more likely to develop this condition if: °You have genes that add to your risk. °You are sexually active. °You take antibiotic medicines. °You have trouble peeing because of: °A prostate that is bigger than normal, if you are female. °A blockage in the part of your body that drains pee from the bladder. °A kidney stone. °A nerve condition that affects your bladder. °Not getting enough to drink. °Not peeing often enough. °You have other conditions, such as: °Diabetes. °A weak disease-fighting system (immune system). °Sickle cell disease. °Gout. °Injury of the spine. °What are the signs or symptoms? °Symptoms of this condition include: °Needing to pee right away. °Peeing small amounts often. °Pain or burning when peeing. °Blood in the pee. °Pee that smells bad or not like normal. °Trouble peeing. °Pee that is cloudy. °Fluid coming from the vagina, if you are female. °Pain in the belly or lower back. °Other symptoms include: °Vomiting. °Not feeling hungry. °Feeling mixed up (confused). This may be the first symptom in  older adults. °Being tired and grouchy (irritable). °A fever. °Watery poop (diarrhea). °How is this treated? °Taking antibiotic medicine. °Taking other medicines. °Drinking enough water. °In some cases, you may need to see a specialist. °Follow these instructions at home: °Medicines °Take over-the-counter and prescription medicines only as told by your doctor. °If you were prescribed an antibiotic medicine, take it as told by your doctor. Do not stop taking it even if you start to feel better. °General instructions °Make sure you: °Pee until your bladder is empty. °Do not hold pee for a long time. °Empty your bladder after sex. °Wipe from front to back after peeing or pooping if you are a female. Use each tissue one time when you wipe. °Drink enough fluid to keep your pee pale yellow. °Keep all follow-up visits. °Contact a doctor if: °You do not get better after 1-2 days. °Your symptoms go away and then come back. °Get help right away if: °You have very bad back pain. °You have very bad pain in your lower belly. °You have a fever. °You have chills. °You feeling like you will vomit or you vomit. °Summary °A urinary tract infection (UTI) is an infection of any part of the urinary tract. °This condition is caused by germs in your genital area. °There are many risk factors for a UTI. °Treatment includes antibiotic medicines. °Drink enough fluid to keep your pee pale yellow. °This information is not intended to replace advice given to you by your health care provider. Make sure you discuss any questions you have with your health care provider. °Document Revised: 03/25/2020 Document Reviewed: 03/25/2020 °Elsevier Patient Education © 2022   [employer] Vaginitis Vaginitis is a condition in which the vaginal tissue swells and becomes irritated. This condition is most often caused by a change in the normal balance of bacteria and yeast that live in the vagina. This change causes an overgrowth of certain bacteria or yeast,  which causes the inflammation. There are different types of vaginitis. What are the causes? The cause of this condition depends on the type of vaginitis. It can be caused by: Bacteria (bacterial vaginosis). Yeast, which is a fungus (candidiasis). A parasite (trichomoniasis vaginitis). A virus (viral vaginitis). Low hormone levels (atrophic vaginitis). Low hormone levels can occur during pregnancy, breastfeeding, or after menopause. Irritants, such as bubble baths, scented tampons, and feminine sprays (allergic vaginitis). Other factors can change the normal balance of the yeast and bacteria that live in the vagina. These include: Antibiotic medicines. Poor hygiene. Diaphragms, vaginal sponges, spermicides, birth control pills, and intrauterine devices (IUDs). Sex. Infection. Uncontrolled diabetes. A weakened body defense system (immune system). What increases the risk? This condition is more likely to develop in women who: Smoke or are exposed to secondhand smoke. Use vaginal douches, scented tampons, or scented sanitary pads. Wear tight-fitting pants or thong underwear. Use oral birth control pills or an IUD. Have sex without a condom or have multiple partners. Have an STI. Frequently use the spermicide nonoxynol-9. Eat lots of foods high in sugar or who have uncontrolled diabetes. Have low estrogen levels. Have a weakened immune system from an immune disorder or medical treatment. Are pregnant or breastfeeding. What are the signs or symptoms? Symptoms vary depending on the cause of the vaginitis. Common symptoms include: Abnormal vaginal discharge. The discharge is white, gray, or yellow with bacterial vaginosis. The discharge is thick, white, and cheesy with a yeast infection. The discharge is frothy and yellow or greenish with trichomoniasis. A bad vaginal smell. The smell is fishy with bacterial vaginosis. Vaginal itching, pain, or swelling. Pain with sex. Pain or burning  when urinating. Sometimes there are no symptoms. How is this diagnosed? This condition is diagnosed based on your symptoms and medical history. A physical exam, including a pelvic exam, will also be done. You may also have other tests, including: Tests to determine the pH level (acidity or alkalinity) of your vagina. A whiff test to assess the odor that results when a sample of your vaginal discharge is mixed with a potassium hydroxide solution. Tests of vaginal fluid. A sample will be examined under a microscope. How is this treated? Treatment varies depending on the type of vaginitis you have. Your treatment may include: Antibiotic creams or pills to treat bacterial vaginosis and trichomoniasis. Antifungal medicines, such as vaginal creams or suppositories, to treat a yeast infection. Medicine to ease discomfort if you have viral vaginitis. Your sexual partner should also be treated. Estrogen delivered in a cream, pill, suppository, or vaginal ring to treat atrophic vaginitis. If vaginal dryness occurs, lubricants and moisturizing creams may help. You may need to avoid scented soaps, sprays, or douches. Stopping use of a product that is causing allergic vaginitis and then using a vaginal cream to treat the symptoms. Follow these instructions at home: Lifestyle Keep your genital area clean and dry. Avoid soap, and only rinse the area with water. Do not douche or use tampons until your health care provider says it is okay. Use sanitary pads, if needed. Do not have sex until your health care provider approves. When you can return to sex, practice safe sex and use condoms. Wipe  from front to back. This avoids the spread of bacteria from the rectum to the vagina. General instructions Take over-the-counter and prescription medicines only as told by your health care provider. If you were prescribed an antibiotic medicine, take or use it as told by your health care provider. Do not stop taking or  using the antibiotic even if you start to feel better. Keep all follow-up visits. This is important. How is this prevented? Use mild, unscented products. Do not use things that can irritate the vagina, such as fabric softeners. Avoid the following products if they are scented: Feminine sprays. Detergents. Tampons. Feminine hygiene products. Soaps or bubble baths. Let air reach your genital area. To do this: Wear cotton underwear to reduce moisture buildup. Avoid wearing underwear while you sleep. Avoid wearing tight pants and underwear or nylons without a cotton panel. Avoid wearing thong underwear. Take off any wet clothing, such as bathing suits, as soon as possible. Practice safe sex and use condoms. Contact a health care provider if: You have abdominal or pelvic pain. You have a fever or chills. You have symptoms that last for more than 2-3 days. Get help right away if: You have a fever and your symptoms suddenly get worse. Summary Vaginitis is a condition in which the vaginal tissue becomes inflamed.This condition is most often caused by a change in the normal balance of bacteria and yeast that live in the vagina. Treatment varies depending on the type of vaginitis you have. Do not douche, use tampons, or have sex until your health care provider approves. When you can return to sex, practice safe sex and use condoms. This information is not intended to replace advice given to you by your health care provider. Make sure you discuss any questions you have with your health care provider. Document Revised: 02/11/2020 Document Reviewed: 02/11/2020 Elsevier Patient Education  [place].

## 2021-09-20 ENCOUNTER — Other Ambulatory Visit: Payer: Self-pay | Admitting: Adult Health

## 2021-09-20 DIAGNOSIS — B9689 Other specified bacterial agents as the cause of diseases classified elsewhere: Secondary | ICD-10-CM | POA: Insufficient documentation

## 2021-09-20 DIAGNOSIS — B3731 Acute candidiasis of vulva and vagina: Secondary | ICD-10-CM | POA: Insufficient documentation

## 2021-09-20 LAB — CERVICOVAGINAL ANCILLARY ONLY
Bacterial Vaginitis (gardnerella): POSITIVE — AB
Candida Glabrata: NEGATIVE
Candida Vaginitis: POSITIVE — AB
Comment: NEGATIVE
Comment: NEGATIVE
Comment: NEGATIVE

## 2021-09-20 LAB — URINE CULTURE
MICRO NUMBER:: 12911958
Result:: NO GROWTH
SPECIMEN QUALITY:: ADEQUATE

## 2021-09-20 MED ORDER — METRONIDAZOLE 500 MG PO TABS: 500.0000 mg | ORAL_TABLET | Freq: Two times a day (BID) | ORAL | 0 refills | Status: DC

## 2021-09-20 NOTE — Progress Notes (Signed)
Meds ordered this encounter  Medications   metroNIDAZOLE (FLAGYL) 500 MG tablet    Sig: Take 1 tablet (500 mg total) by mouth 2 (two) times daily.    Dispense:  14 tablet    Refill:  0     Bacterial vaginosis - Plan: metroNIDAZOLE (FLAGYL) 500 MG tablet  Vaginal candidiasis Currently on Terazol vaginal x 7 days.

## 2021-09-20 NOTE — Progress Notes (Signed)
No growth on urine culture, can stop Cipro. Symptoms likely from confirmed yeast and BV as already resulted.  Follow up as needed, by appointment.

## 2021-09-20 NOTE — Progress Notes (Signed)
Positive for yeast infection and bacterial vaginosis. She was given Terazol vaginal 7 day course on 09/19/21 this treats yeast infection.  Will send in Flagyl for bacterial vaginosis. No alcohol with this or 10 days after use.  Urine culture is still pending.  Follow up in office if any symptoms not resolving at anytime.

## 2021-09-21 DIAGNOSIS — N898 Other specified noninflammatory disorders of vagina: Secondary | ICD-10-CM | POA: Insufficient documentation

## 2021-09-21 DIAGNOSIS — R399 Unspecified symptoms and signs involving the genitourinary system: Secondary | ICD-10-CM | POA: Insufficient documentation

## 2021-09-22 LAB — PROTEIN ELECTROPHORESIS, SERUM, WITH REFLEX
Abnormal Protein Band1: 0.4 g/dL — ABNORMAL HIGH
Albumin ELP: 4.1 g/dL (ref 3.8–4.8)
Alpha 1: 0.3 g/dL (ref 0.2–0.3)
Alpha 2: 0.6 g/dL (ref 0.5–0.9)
Beta 2: 0.3 g/dL (ref 0.2–0.5)
Beta Globulin: 0.4 g/dL (ref 0.4–0.6)
Gamma Globulin: 1.1 g/dL (ref 0.8–1.7)
Total Protein: 6.9 g/dL (ref 6.1–8.1)

## 2021-09-22 LAB — CK: Total CK: 45 U/L (ref 29–143)

## 2021-09-22 LAB — IFE INTERPRETATION: Immunofix Electr Int: DETECTED

## 2021-09-25 ENCOUNTER — Telehealth: Payer: Self-pay | Admitting: *Deleted

## 2021-09-25 DIAGNOSIS — R778 Other specified abnormalities of plasma proteins: Secondary | ICD-10-CM

## 2021-09-25 NOTE — Telephone Encounter (Signed)
-----   Message from Ofilia Neas, PA-C sent at 09/22/2021  2:26 PM EST ----- CK WNL.  IFE revealed IgG kappa monoclonal protein.  Please notify the patient and refer to hematology for further evaluation.

## 2021-09-28 ENCOUNTER — Ambulatory Visit: Payer: 59 | Admitting: Rheumatology

## 2021-09-28 DIAGNOSIS — R5383 Other fatigue: Secondary | ICD-10-CM

## 2021-09-28 DIAGNOSIS — M7061 Trochanteric bursitis, right hip: Secondary | ICD-10-CM

## 2021-09-28 DIAGNOSIS — J449 Chronic obstructive pulmonary disease, unspecified: Secondary | ICD-10-CM

## 2021-09-28 DIAGNOSIS — M5136 Other intervertebral disc degeneration, lumbar region: Secondary | ICD-10-CM

## 2021-09-28 DIAGNOSIS — Z87891 Personal history of nicotine dependence: Secondary | ICD-10-CM

## 2021-09-28 DIAGNOSIS — M19041 Primary osteoarthritis, right hand: Secondary | ICD-10-CM

## 2021-09-28 DIAGNOSIS — R002 Palpitations: Secondary | ICD-10-CM

## 2021-09-28 DIAGNOSIS — L570 Actinic keratosis: Secondary | ICD-10-CM

## 2021-09-28 DIAGNOSIS — M7918 Myalgia, other site: Secondary | ICD-10-CM

## 2021-09-28 DIAGNOSIS — E559 Vitamin D deficiency, unspecified: Secondary | ICD-10-CM

## 2021-09-28 DIAGNOSIS — Z8639 Personal history of other endocrine, nutritional and metabolic disease: Secondary | ICD-10-CM

## 2021-09-28 DIAGNOSIS — E538 Deficiency of other specified B group vitamins: Secondary | ICD-10-CM

## 2021-09-28 DIAGNOSIS — E890 Postprocedural hypothyroidism: Secondary | ICD-10-CM

## 2021-09-28 DIAGNOSIS — M542 Cervicalgia: Secondary | ICD-10-CM

## 2021-09-28 DIAGNOSIS — Z8719 Personal history of other diseases of the digestive system: Secondary | ICD-10-CM

## 2021-09-29 ENCOUNTER — Encounter: Payer: Self-pay | Admitting: *Deleted

## 2021-10-03 ENCOUNTER — Encounter: Payer: Self-pay | Admitting: Internal Medicine

## 2021-10-03 ENCOUNTER — Inpatient Hospital Stay: Payer: 59

## 2021-10-03 ENCOUNTER — Inpatient Hospital Stay: Payer: 59 | Attending: Internal Medicine | Admitting: Internal Medicine

## 2021-10-03 ENCOUNTER — Other Ambulatory Visit: Payer: Self-pay

## 2021-10-03 DIAGNOSIS — Z79899 Other long term (current) drug therapy: Secondary | ICD-10-CM | POA: Insufficient documentation

## 2021-10-03 DIAGNOSIS — M2550 Pain in unspecified joint: Secondary | ICD-10-CM | POA: Insufficient documentation

## 2021-10-03 DIAGNOSIS — R200 Anesthesia of skin: Secondary | ICD-10-CM | POA: Diagnosis not present

## 2021-10-03 DIAGNOSIS — Z8249 Family history of ischemic heart disease and other diseases of the circulatory system: Secondary | ICD-10-CM | POA: Diagnosis not present

## 2021-10-03 DIAGNOSIS — J449 Chronic obstructive pulmonary disease, unspecified: Secondary | ICD-10-CM | POA: Insufficient documentation

## 2021-10-03 DIAGNOSIS — Z801 Family history of malignant neoplasm of trachea, bronchus and lung: Secondary | ICD-10-CM | POA: Insufficient documentation

## 2021-10-03 DIAGNOSIS — Z8051 Family history of malignant neoplasm of kidney: Secondary | ICD-10-CM | POA: Insufficient documentation

## 2021-10-03 DIAGNOSIS — E039 Hypothyroidism, unspecified: Secondary | ICD-10-CM | POA: Insufficient documentation

## 2021-10-03 DIAGNOSIS — Z833 Family history of diabetes mellitus: Secondary | ICD-10-CM | POA: Diagnosis not present

## 2021-10-03 DIAGNOSIS — Z87891 Personal history of nicotine dependence: Secondary | ICD-10-CM | POA: Insufficient documentation

## 2021-10-03 DIAGNOSIS — M549 Dorsalgia, unspecified: Secondary | ICD-10-CM | POA: Diagnosis not present

## 2021-10-03 DIAGNOSIS — Z8349 Family history of other endocrine, nutritional and metabolic diseases: Secondary | ICD-10-CM | POA: Insufficient documentation

## 2021-10-03 DIAGNOSIS — Z8049 Family history of malignant neoplasm of other genital organs: Secondary | ICD-10-CM | POA: Insufficient documentation

## 2021-10-03 DIAGNOSIS — Z8042 Family history of malignant neoplasm of prostate: Secondary | ICD-10-CM | POA: Insufficient documentation

## 2021-10-03 DIAGNOSIS — D472 Monoclonal gammopathy: Secondary | ICD-10-CM | POA: Insufficient documentation

## 2021-10-03 DIAGNOSIS — R202 Paresthesia of skin: Secondary | ICD-10-CM | POA: Diagnosis not present

## 2021-10-03 DIAGNOSIS — M1990 Unspecified osteoarthritis, unspecified site: Secondary | ICD-10-CM | POA: Insufficient documentation

## 2021-10-03 DIAGNOSIS — Z881 Allergy status to other antibiotic agents status: Secondary | ICD-10-CM | POA: Insufficient documentation

## 2021-10-03 DIAGNOSIS — Z800 Family history of malignant neoplasm of digestive organs: Secondary | ICD-10-CM | POA: Insufficient documentation

## 2021-10-03 DIAGNOSIS — Z808 Family history of malignant neoplasm of other organs or systems: Secondary | ICD-10-CM | POA: Insufficient documentation

## 2021-10-03 NOTE — Progress Notes (Signed)
New patient evaluation.   

## 2021-10-03 NOTE — Assessment & Plan Note (Addendum)
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    Long discussion with the patient regarding natural history of MGUS; small risk of progression to multiple myeloma.  Discussed that in general the risk of transformation to myeloma is about 1 %/year.  #Joint pain osteoarthritis-clinically not suggestive of multiple myeloma.  Reviewed x-rays from 2022-cervical spine/hips-no lytic lesions noted.  Hold off skeletal survey as low clinical suspicion for multiple myeloma..   # COPD-continue inhalers/follow-up with PCP.  Thank you Dr.Deweshwar for allowing me to participate in the care of your pleasant patient. Please do not hesitate to contact me with questions or concerns in the interim.  # DISPOSITION: # NO labs today # follow up in 6 months- MD; 2 week prior-labs- cbc/cmp;MM panel; k/l light chains-Dr.B  # 45 minutes face-to-face with the patient discussing the above plan of care; more than 50% of time spent on prognosis/ natural history; counseling and coordination.

## 2021-10-03 NOTE — Progress Notes (Signed)
[facility] CONSULT NOTE  Patient Care Team: Flinchum, Kelby Aline, FNP as PCP - General (Family Medicine) Cammie Sickle, MD as Consulting Physician (Hematology and Oncology)  CHIEF COMPLAINTS/PURPOSE OF CONSULTATION: Monoclonal gammopathy  HEMATOLOGY HISTORY  # IgG kappa [0.4 gm/ld; JAN 2023- Rheumatology; [place]]- NOV 2022- cbc/cmp;WNL.   #Osteoarthritis [Dr.Deweshar; [place]]   HISTORY OF PRESENTING ILLNESS: Alone.  Ambulating independently. Megan Harper 61 y.o.  female has been referred to [place] by rheumatology for further evaluation/work-up for monoclonal gammopathy.  Patient noted to have worsening joint pains in the last few months.  Initially worked up by [employer] possibility of Lyme's disease.  Patient was referred to rheumatology for further evaluation recommendations.  Work-up noted to have abnormal M protein. No unusual bone pain.  No significant fatigue.  Mild tingling and numbness in extremities.  Family history of multiple myeloma-none   Review of Systems  Constitutional:  Negative for chills, diaphoresis, fever, malaise/fatigue and weight loss.  HENT:  Negative for nosebleeds and sore throat.   Eyes:  Negative for double vision.  Respiratory:  Negative for cough, hemoptysis, sputum production, shortness of breath and wheezing.   Cardiovascular:  Negative for chest pain, palpitations, orthopnea and leg swelling.  Gastrointestinal:  Negative for abdominal pain, blood in stool, constipation, diarrhea, heartburn, melena, nausea and vomiting.  Genitourinary:  Negative for dysuria, frequency and urgency.  Musculoskeletal:  Positive for back pain and joint pain.  Skin: Negative.  Negative for itching and rash.  Neurological:  Negative for dizziness, tingling, focal weakness, weakness and headaches.  Endo/Heme/Allergies:  Does not bruise/bleed easily.  Psychiatric/Behavioral:  Negative for depression. The patient is not nervous/anxious and does not have  insomnia.    MEDICAL HISTORY:  Past Medical History:  Diagnosis Date   Abnormal SPEP    Anxiety    Arthralgia    Cervical dysplasia    COPD (chronic obstructive pulmonary disease) (HCC)    Emphysema of lung (HCC)    Endometriosis    Fibrocystic breast disease    GERD (gastroesophageal reflux disease)    Osteoporosis    Thyroid nodule    Vitamin D insufficiency     SURGICAL HISTORY: Past Surgical History:  Procedure Laterality Date   laproscopy for endometriosis     THYROID LOBECTOMY Right 03/08/2010   TUBAL LIGATION  01/23/1986    SOCIAL HISTORY: Social History   Socioeconomic History   Marital status: Married    Spouse name: Not on file   Number of children: 3   Years of education: Not on file   Highest education level: Not on file  Occupational History   Occupation: customer advocate    Employer: [employer]  Tobacco Use   Smoking status: Former    Packs/day: 2.50    Years: 31.00    Pack years: 77.50    Types: Cigarettes    Quit date: 09/17/2009    Years since quitting: 12.0   Smokeless tobacco: Never  Vaping Use   Vaping Use: Never used  Substance and Sexual Activity   Alcohol use: No   Drug use: No   Sexual activity: Yes    Birth control/protection: Surgical  Other Topics Concern   Not on file  Social History Narrative   Lives in [place]; with husband- 3 dogs. Quit smoking 2010. No alcohol. Used to work at [employer] care.    Social Determinants of Health   Financial Resource Strain: Not on file  Food Insecurity: Not on file  Transportation  Needs: Not on file  Physical Activity: Not on file  Stress: Not on file  Social Connections: Not on file  Intimate Partner Violence: Not on file    FAMILY HISTORY: Family History  Problem Relation Age of Onset   Uterine cancer Mother    Lung cancer Father        smoked   Esophageal cancer Maternal Aunt    Lymphoma Maternal Aunt    Kidney cancer Maternal Uncle    Prostate cancer Maternal Uncle     Heart disease Maternal Uncle    Heart disease Maternal Grandmother    Hypertension Maternal Grandmother    Thyroid disease Maternal Grandmother    Throat cancer Maternal Grandmother    Prostate cancer Maternal Grandfather    Hypertension Maternal Grandfather    Colon cancer Maternal Grandfather    Kidney cancer Maternal Grandfather    Heart disease Paternal Grandmother    Diabetes Paternal Grandmother     ALLERGIES:  is allergic to ceclor [cefaclor] and pantoprazole.  MEDICATIONS:  Current Outpatient Medications  Medication Sig Dispense Refill   albuterol (PROVENTIL HFA;VENTOLIN HFA) 108 (90 Base) MCG/ACT inhaler Inhale 2 puffs into the lungs every 6 (six) hours as needed for wheezing or shortness of breath. 1 Inhaler 2   Cyanocobalamin (VITAMIN B-12 PO) Take by mouth daily.     fluticasone (FLONASE) 50 MCG/ACT nasal spray Place 2 sprays into both nostrils daily. (Patient taking differently: Place 2 sprays into both nostrils as needed.) 18.2 mL 2   meloxicam (MOBIC) 7.5 MG tablet Take 1 tablet (7.5 mg total) by mouth daily. 90 tablet 0   omeprazole (PRILOSEC) 20 MG capsule Take 1 capsule (20 mg total) by mouth daily. 30 capsule 11   triamcinolone cream (KENALOG) 0.1 % Apply 1 application topically 2 (two) times daily. (Patient taking differently: Apply 1 application topically as needed.) 30 g 0   Vitamin D, Ergocalciferol, (DRISDOL) 1.25 MG (50000 UNIT) CAPS capsule Take 1 capsule (50,000 Units total) by mouth every 7 (seven) days. (taking one tablet per week) schedule  lab in office 1-2 weeks after completing prescription. 12 capsule 0   ciprofloxacin (CIPRO) 500 MG tablet Take 1 tablet (500 mg total) by mouth 2 (two) times daily. (Patient not taking: Reported on 10/03/2021) 14 tablet 0   methocarbamol (ROBAXIN) 500 MG tablet  (Patient not taking: Reported on 10/03/2021)     metroNIDAZOLE (FLAGYL) 500 MG tablet Take 1 tablet (500 mg total) by mouth 2 (two) times daily. (Patient not  taking: Reported on 10/03/2021) 14 tablet 0   No current facility-administered medications for this visit.      PHYSICAL EXAMINATION:   Vitals:   10/03/21 1400  BP: 132/73  Pulse: 63  Resp: 16  Temp: 99.7 F (37.6 C)   Filed Weights   10/03/21 1400  Weight: 178 lb 14.4 oz (81.1 kg)    Physical Exam Vitals and nursing note reviewed.  HENT:     Head: Normocephalic and atraumatic.     Mouth/Throat:     Pharynx: Oropharynx is clear.  Eyes:     Extraocular Movements: Extraocular movements intact.     Pupils: Pupils are equal, round, and reactive to light.  Cardiovascular:     Rate and Rhythm: Normal rate and regular rhythm.  Pulmonary:     Comments: Decreased breath sounds bilaterally.  Abdominal:     Palpations: Abdomen is soft.  Musculoskeletal:        General: Normal range of motion.  Cervical back: Normal range of motion.  Skin:    General: Skin is warm.  Neurological:     General: No focal deficit present.     Mental Status: She is alert and oriented to person, place, and time.  Psychiatric:        Behavior: Behavior normal.        Judgment: Judgment normal.    LABORATORY DATA:  I have reviewed the data as listed Lab Results  Component Value Date   WBC 6.9 07/12/2021   HGB 14.0 07/12/2021   HCT 41.7 07/12/2021   MCV 95 07/12/2021   PLT 295 07/12/2021   Recent Labs    11/16/20 1425 12/14/20 1121 07/12/21 1436 09/14/21 1059  NA 140 143 141  --   K 4.1 4.4 3.9  --   CL 100 101 101  --   CO2 26 25 27   --   GLUCOSE 108* 86 78  --   BUN 10 9 8   --   CREATININE 0.76 0.72 0.82  --   CALCIUM 9.9 9.4 9.4  --   PROT 7.6 7.5 6.7 6.9  ALBUMIN 4.6 4.6 4.5  --   AST 20 16 17   --   ALT 15 18 16   --   ALKPHOS 96 89 93  --   BILITOT 0.5 0.5 0.4  --      XR Cervical Spine 2 or 3 views  Result Date: 09/14/2021 No significant disc space narrowing was noted.  No facet joint arthropathy was noted. Impression: Unremarkable x-ray of the cervical spine.    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    Long discussion with the patient regarding natural history of MGUS; small risk of progression to multiple myeloma.  Discussed that in general the risk of transformation to myeloma is about 1 %/year.  #Joint pain osteoarthritis-clinically not suggestive of multiple myeloma.  Reviewed x-rays from 2022-cervical spine/hips-no lytic lesions noted.  Hold off skeletal survey as low clinical suspicion for multiple myeloma..   # COPD-continue inhalers/follow-up with PCP.  Thank you Dr.Deweshwar for allowing me to participate in the care of your pleasant patient. Please do not hesitate to contact me with questions or concerns in the interim.  # DISPOSITION: # NO labs today # follow up in 6 months- MD; 2 week prior-labs- cbc/cmp;MM panel; k/l light chains-Dr.B  # 45 minutes face-to-face with the patient discussing the above plan of care; more than 50% of time spent on prognosis/ natural history; counseling and coordination.     All questions were answered. The patient knows to call the clinic with any problems, questions or concerns.      Cammie Sickle, MD 10/03/2021 6:17 PM

## 2021-10-24 ENCOUNTER — Encounter: Payer: Self-pay | Admitting: Adult Health

## 2021-10-24 ENCOUNTER — Ambulatory Visit (INDEPENDENT_AMBULATORY_CARE_PROVIDER_SITE_OTHER): Payer: 59 | Admitting: Adult Health

## 2021-10-24 ENCOUNTER — Other Ambulatory Visit: Payer: Self-pay

## 2021-10-24 VITALS — BP 132/72 | HR 64 | Temp 98.0°F | Ht 66.0 in | Wt 178.0 lb

## 2021-10-24 DIAGNOSIS — K219 Gastro-esophageal reflux disease without esophagitis: Secondary | ICD-10-CM | POA: Diagnosis not present

## 2021-10-24 DIAGNOSIS — N760 Acute vaginitis: Secondary | ICD-10-CM | POA: Diagnosis not present

## 2021-10-24 DIAGNOSIS — B3731 Acute candidiasis of vulva and vagina: Secondary | ICD-10-CM | POA: Diagnosis not present

## 2021-10-24 DIAGNOSIS — Z87898 Personal history of other specified conditions: Secondary | ICD-10-CM

## 2021-10-24 DIAGNOSIS — B9689 Other specified bacterial agents as the cause of diseases classified elsewhere: Secondary | ICD-10-CM

## 2021-10-24 MED ORDER — METRONIDAZOLE 500 MG PO TABS: 500.0000 mg | ORAL_TABLET | Freq: Two times a day (BID) | ORAL | 0 refills | Status: DC

## 2021-10-24 MED ORDER — TERCONAZOLE 0.4 % VA CREA: 1.0000 | TOPICAL_CREAM | Freq: Every day | VAGINAL | 0 refills | Status: DC

## 2021-10-24 NOTE — Progress Notes (Signed)
Acute Office Visit  Subjective:    Patient ID: Megan Harper, female    DOB: September 06, 1960, 61 y.o.   MRN: [id number]  Chief Complaint  Patient presents with   Follow-up    F/u post bacterial vaginitis. Pt finished rx. Pt stated felt better, but now having same sx.     HPI Patient is in today for follow up on bacterial vaginosis that resolved in January with Flagyl. She had candida and bacterial vaginosis on swab and was treated accordingly. She feels it felt better but did not clear completely.  She has again noticed symptoms   He had seem Dr. Patrecia Pace, has orders, testing for multiple myeloma has not had labs done.  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   Has appointment with pulmonary visit, 11/06/2021 She has not seen gynecology yet. She is trying to schedule now was referred to Dr. Marcelline Mates 01/26/21 for abnormal PAP. Denies any vaginal bleeding or pain. Is aware she needs to still see gynecology   Due for colonoscopy and endoscopy. She is due for follow up and will call Dr. Blanch Media office.  She does request a referral back to her cardiologist for a history of palpitations seen last in 2016, she reports she wants to establish care back with him. Has no immediate concerns at today's visit and feels "fine" .   Patient  denies any fever, body aches,chills, rash, chest pain, shortness of breath, nausea, vomiting, or diarrhea.  Denies dizziness, lightheadedness, pre syncopal or syncopal episodes.    Past Medical History:  Diagnosis Date   Abnormal SPEP    Anxiety    Arthralgia    Cervical dysplasia    COPD (chronic obstructive pulmonary disease) (HCC)    Emphysema of lung (HCC)    Endometriosis    Fibrocystic breast disease    GERD (gastroesophageal reflux disease)    Osteoporosis    Thyroid nodule    Vitamin D insufficiency     Past Surgical History:  Procedure Laterality Date   laproscopy for endometriosis     THYROID LOBECTOMY Right 03/08/2010   TUBAL LIGATION   01/23/1986    Family History  Problem Relation Age of Onset   Uterine cancer Mother    Lung cancer Father        smoked   Esophageal cancer Maternal Aunt    Lymphoma Maternal Aunt    Kidney cancer Maternal Uncle    Prostate cancer Maternal Uncle    Heart disease Maternal Uncle    Heart disease Maternal Grandmother    Hypertension Maternal Grandmother    Thyroid disease Maternal Grandmother    Throat cancer Maternal Grandmother    Prostate cancer Maternal Grandfather    Hypertension Maternal Grandfather    Colon cancer Maternal Grandfather    Kidney cancer Maternal Grandfather    Heart disease Paternal Grandmother    Diabetes Paternal Grandmother     Social History   Socioeconomic History   Marital status: Married    Spouse name: Not on file   Number of children: 3   Years of education: Not on file   Highest education level: Not on file  Occupational History   Occupation: customer advocate    Employer: [employer]  Tobacco Use   Smoking status: Former    Packs/day: 2.50    Years: 31.00    Pack years: 77.50    Types: Cigarettes    Quit date: 09/17/2009    Years since quitting: 12.1   Smokeless tobacco: Never  Vaping Use   Vaping Use: Never used  Substance and Sexual Activity   Alcohol use: No   Drug use: No   Sexual activity: Yes    Birth control/protection: Surgical  Other Topics Concern   Not on file  Social History Narrative   Lives in [place]; with husband- 3 dogs. Quit smoking 2010. No alcohol. Used to work at [employer] care.    Social Determinants of Health   Financial Resource Strain: Not on file  Food Insecurity: Not on file  Transportation Needs: Not on file  Physical Activity: Not on file  Stress: Not on file  Social Connections: Not on file  Intimate Partner Violence: Not on file    Outpatient Medications Prior to Visit  Medication Sig Dispense Refill   albuterol (PROVENTIL HFA;VENTOLIN HFA) 108 (90 Base) MCG/ACT inhaler Inhale 2  puffs into the lungs every 6 (six) hours as needed for wheezing or shortness of breath. 1 Inhaler 2   Cyanocobalamin (VITAMIN B-12 PO) Take by mouth daily.     fluticasone (FLONASE) 50 MCG/ACT nasal spray Place 2 sprays into both nostrils daily. (Patient taking differently: Place 2 sprays into both nostrils as needed.) 18.2 mL 2   meloxicam (MOBIC) 7.5 MG tablet Take 1 tablet (7.5 mg total) by mouth daily. 90 tablet 0   triamcinolone cream (KENALOG) 0.1 % Apply 1 application topically 2 (two) times daily. (Patient taking differently: Apply 1 application topically as needed.) 30 g 0   Vitamin D, Ergocalciferol, (DRISDOL) 1.25 MG (50000 UNIT) CAPS capsule Take 1 capsule (50,000 Units total) by mouth every 7 (seven) days. (taking one tablet per week) schedule  lab in office 1-2 weeks after completing prescription. 12 capsule 0   ciprofloxacin (CIPRO) 500 MG tablet Take 1 tablet (500 mg total) by mouth 2 (two) times daily. (Patient not taking: Reported on 10/03/2021) 14 tablet 0   methocarbamol (ROBAXIN) 500 MG tablet  (Patient not taking: Reported on 10/03/2021)     metroNIDAZOLE (FLAGYL) 500 MG tablet Take 1 tablet (500 mg total) by mouth 2 (two) times daily. (Patient not taking: Reported on 10/03/2021) 14 tablet 0   omeprazole (PRILOSEC) 20 MG capsule Take 1 capsule (20 mg total) by mouth daily. 30 capsule 11   No facility-administered medications prior to visit.    Allergies  Allergen Reactions   Ceclor [Cefaclor] Other (See Comments)    Flu like symptoms   Pantoprazole Itching    Review of Systems  Constitutional: Negative.  Negative for diaphoresis, fatigue and fever.  HENT: Negative.    Respiratory: Negative.    Cardiovascular:  Positive for palpitations (histiory of sees Dr. Einar Gip. wants referral back, she has no acute symptoms reported today.). Negative for chest pain and leg swelling.  Genitourinary:  Negative for decreased urine volume, difficulty urinating, dyspareunia, dysuria, enuresis,  flank pain, frequency, genital sores, hematuria, menstrual problem, pelvic pain, urgency, vaginal bleeding, vaginal discharge and vaginal pain (vaginal irritation, denies any sore, lesion or discharge.).  Musculoskeletal:  Positive for arthralgias. Negative for back pain.  Hematological: Negative.   Psychiatric/Behavioral: Negative.        Objective:    Physical Exam Vitals reviewed.  Constitutional:      General: She is not in acute distress.    Appearance: Normal appearance. She is not ill-appearing, toxic-appearing or diaphoretic.     Comments: Patient appers well, not sickly. Speaking in complete sentences. Patient moves on and off of exam table and in room without difficulty. Gait is normal  in hall and in room. Patient is oriented to person place time and situation. Patient answers questions appropriately and engages eye contact and verbal dialect with provider.    HENT:     Head: Normocephalic and atraumatic.     Right Ear: Ear canal and external ear normal.     Left Ear: Ear canal and external ear normal.     Nose: Nose normal.     Mouth/Throat:     Mouth: Mucous membranes are moist.     Pharynx: Oropharynx is clear.  Eyes:     General: No scleral icterus.       Right eye: No discharge.        Left eye: No discharge.     Extraocular Movements: Extraocular movements intact.     Conjunctiva/sclera: Conjunctivae normal.  Neck:     Vascular: No carotid bruit.  Cardiovascular:     Rate and Rhythm: Normal rate and regular rhythm.     Pulses: Normal pulses.     Heart sounds: Normal heart sounds. No murmur heard.   No friction rub. No gallop.  Pulmonary:     Effort: Pulmonary effort is normal. No respiratory distress.     Breath sounds: Normal breath sounds. No stridor. No wheezing, rhonchi or rales.  Chest:     Chest wall: No tenderness.  Abdominal:     General: There is no distension.     Palpations: Abdomen is soft. There is no mass.     Tenderness: There is no abdominal  tenderness. There is no right CVA tenderness, left CVA tenderness, guarding or rebound.     Hernia: No hernia is present.  Genitourinary:    Comments: Declined exam/ Nuswab  Musculoskeletal:        General: Normal range of motion.     Cervical back: Normal range of motion.  Lymphadenopathy:     Cervical: No cervical adenopathy.  Skin:    General: Skin is warm.     Findings: No erythema or rash.  Neurological:     General: No focal deficit present.     Mental Status: She is alert and oriented to person, place, and time. Mental status is at baseline.  Psychiatric:        Mood and Affect: Mood normal.        Behavior: Behavior normal.        Thought Content: Thought content normal.        Judgment: Judgment normal.    BP 132/72 (BP Location: Right Arm, Patient Position: Sitting, Cuff Size: Small)    Pulse 64    Temp 98 F (36.7 C) (Oral)    Ht 5' 6"  (1.676 m)    Wt 178 lb (80.7 kg)    SpO2 98%    BMI 28.73 kg/m  Wt Readings from Last 3 Encounters:  10/24/21 178 lb (80.7 kg)  10/03/21 178 lb 14.4 oz (81.1 kg)  09/19/21 184 lb (83.5 kg)    There are no preventive care reminders to display for this patient.  There are no preventive care reminders to display for this patient.   Lab Results  Component Value Date   TSH 3.530 07/12/2021   Lab Results  Component Value Date   WBC 6.9 07/12/2021   HGB 14.0 07/12/2021   HCT 41.7 07/12/2021   MCV 95 07/12/2021   PLT 295 07/12/2021   Lab Results  Component Value Date   NA 141 07/12/2021   K 3.9 07/12/2021   CO2 27  07/12/2021   GLUCOSE 78 07/12/2021   BUN 8 07/12/2021   CREATININE 0.82 07/12/2021   BILITOT 0.4 07/12/2021   ALKPHOS 93 07/12/2021   AST 17 07/12/2021   ALT 16 07/12/2021   PROT 6.9 09/14/2021   ALBUMIN 4.5 07/12/2021   CALCIUM 9.4 07/12/2021   EGFR 82 07/12/2021   GFR 93.75 07/23/2017   Lab Results  Component Value Date   CHOL 183 12/14/2020   Lab Results  Component Value Date   HDL 60 12/14/2020    Lab Results  Component Value Date   LDLCALC 112 (H) 12/14/2020   Lab Results  Component Value Date   TRIG 59 12/14/2020   No results found for: CHOLHDL No results found for: HGBA1C     Assessment & Plan:   Problem List Items Addressed This Visit       Digestive   Gastroesophageal reflux disease   Relevant Medications   omeprazole (PRILOSEC) 20 MG capsule     Genitourinary   Vaginal candidiasis   Relevant Medications   metroNIDAZOLE (FLAGYL) 500 MG tablet   terconazole (TERAZOL 7) 0.4 % vaginal cream   Bacterial vaginosis - Primary   Relevant Medications   metroNIDAZOLE (FLAGYL) 500 MG tablet   Other Visit Diagnoses     History of palpitations       Relevant Orders   Ambulatory referral to Cardiology      Recommend that patient follow-up with her cardiologist referral was placed, she has no acute symptoms today she has a longstanding history of palpitations dating back to before 2016 she is seen Dr. Einar Gip in the past and would like to follow-up with him as well. She feels well at today's office visit other than vaginal symptoms as discussed above.  She declines exam today and new swab would like to be treated again for bacterial vaginitis and yeast as her symptoms are consistent with what she had back in December.  She feels like she just never completely got rid of it.  She has no pelvic pain.  She denies any concerns for STDs or need for any testing.  Of note she did have an atypical Pap smear and was referred back in 2022 to gynecology, she has not scheduled that appointment yet and will schedule that as soon as possible.  Patient will let the office know should she need another referral. She is due for follow-up with Dr. Henrene Pastor gastroenterology for endoscopy and colonoscopy, it is recommended that patient reach out to her gastroenterologist to set up these appointments.  Patient verbalized understanding.  Meds ordered this encounter  Medications   metroNIDAZOLE  (FLAGYL) 500 MG tablet    Sig: Take 1 tablet (500 mg total) by mouth 2 (two) times daily.    Dispense:  14 tablet    Refill:  0   terconazole (TERAZOL 7) 0.4 % vaginal cream    Sig: Place 1 applicator vaginally at bedtime.    Dispense:  45 g    Refill:  0   omeprazole (PRILOSEC) 20 MG capsule    Sig: Take 1 capsule (20 mg total) by mouth daily.    Dispense:  30 capsule    Refill:  11  Advised patient that for her arthralgias she should keep the appointment with hematology oncology for further work-up for multiple myeloma lab orders have already been ordered by that physician Dr. Rogue Bussing.  Patient also should keep follow-up with Dr. Venetia Maxon rheumatology.  Patient verbalized understanding has no further questions at  this time. Patient did receive a letter that this provider is leaving the office and she will need to establish new care with a new provider for primary care.  Return in 2 weeks (on 11/07/2021), or if symptoms worsen or fail to improve, for at any time for any worsening symptoms, Go to Emergency room/ urgent care if worse.   Marcille Buffy, FNP

## 2021-10-24 NOTE — Patient Instructions (Signed)
Bacterial Vaginosis Bacterial vaginosis is an infection of the vagina. It happens when too many normal germs (healthy bacteria) grow in the vagina. This infection can make it easier to get other infections from sex (STIs). It is very important for pregnant women to get treated. This infection can cause babies to be born early or at a low birth weight. What are the causes? This infection is caused by an increase in certain germs that grow in the vagina. You cannot get this infection from toilet seats, bedsheets, swimming pools, or things that touch your vagina. What increases the risk? Having sex with a new person or more than one person. Having sex without protection. Douching. Having an intrauterine device (IUD). Smoking. Using drugs or drinking alcohol. These can lead you to do things that are risky. Taking certain antibiotic medicines. Being pregnant. What are the signs or symptoms? Some women have no symptoms. Symptoms may include: A discharge from your vagina. It may be gray or white. It can be watery or foamy. A fishy smell. This can happen after sex or during your menstrual period. Itching in and around your vagina. A feeling of burning or pain when you pee (urinate). How is this treated? This infection is treated with antibiotic medicines. These may be given to you as: A pill. A cream for your vagina. A medicine that you put into your vagina (suppository). If the infection comes back after treatment, you may need more antibiotics. Follow these instructions at home: Medicines Take over-the-counter and prescription medicines as told by your doctor. Take or use your antibiotic medicine as told by your doctor. Do not stop taking or using it, even if you start to feel better. General instructions If the person you have sex with is a woman, tell her that you have this infection. She will need to follow up with her doctor. If you have a female partner, he does not need to be  treated. Do not have sex until you finish treatment. Drink enough fluid to keep your pee pale yellow. Keep your vagina and butt clean. Wash the area with warm water each day. Wipe from front to back after you use the toilet. If you are breastfeeding a baby, ask your doctor if you should keep doing so during treatment. Keep all follow-up visits. How is this prevented? Self-care Do not douche. Use only warm water to wash around your vagina. Wear underwear that is cotton or lined with cotton. Do not wear tight pants and pantyhose, especially in the summer. Safe sex Use protection when you have sex. This includes: Use condoms. Use dental dams. This is a thin layer that protects the mouth during oral sex. Limit how many people you have sex with. To prevent this infection, it is best to have sex with just one person. Get tested for STIs. The person you have sex with should also get tested. Drugs and alcohol Do not smoke or use any products that contain nicotine or tobacco. If you need help quitting, ask your doctor. Do not use drugs. Do not drink alcohol if: Your doctor tells you not to drink. You are pregnant, may be pregnant, or are planning to become pregnant. If you drink alcohol: Limit how much you have to 0-1 drink a day. Know how much alcohol is in your drink. In the [place] one drink equals one 12 oz bottle of beer (355 mL), one 5 oz glass of wine (148 mL), or one 1 oz glass of hard liquor (44 mL).  Where to find more information Centers for Disease Control and Prevention: http://www.wolf.info/ American Sexual Health Association: www.ashastd.org Office on [employer] Health: [place] Contact a doctor if: Your symptoms do not get better, even after you are treated. You have more discharge or pain when you pee. You have a fever or chills. You have pain in your belly (abdomen) or in the area between your hips. You have pain with sex. You bleed from your vagina between menstrual  periods. Summary This infection can happen when too many germs (bacteria) grow in the vagina. This infection can make it easier to get infections from sex (STIs). Treating this can lower that chance. Get treated if you are pregnant. This infection can cause babies to be born early. Do not stop taking or using your antibiotic medicine, even if you start to feel better. This information is not intended to replace advice given to you by your health care provider. Make sure you discuss any questions you have with your health care provider. Document Revised: 02/11/2020 Document Reviewed: 02/11/2020 Elsevier Patient Education  [place]. Vaginal Yeast Infection, Adult Vaginal yeast infection is a condition that causes vaginal discharge as well as soreness, swelling, and redness (inflammation) of the vagina. This is a common condition. Some women get this infection frequently. What are the causes? This condition is caused by a change in the normal balance of the yeast (Candida) and normal bacteria that live in the vagina. This change causes an overgrowth of yeast, which causes the inflammation. What increases the risk? The condition is more likely to develop in women who: Take antibiotic medicines. Have diabetes. Take birth control pills. Are pregnant. Douche often. Have a weak body defense system (immune system). Have been taking steroid medicines for a long time. Frequently wear tight clothing. What are the signs or symptoms? Symptoms of this condition include: White, thick, creamy vaginal discharge. Swelling, itching, redness, and irritation of the vagina. The lips of the vagina (labia) may be affected as well. Pain or a burning feeling while urinating. Pain during sex. How is this diagnosed? This condition is diagnosed based on: Your medical history. A physical exam. A pelvic exam. Your health care provider will examine a sample of your vaginal discharge under a microscope.  Your health care provider may send this sample for testing to confirm the diagnosis. How is this treated? This condition is treated with medicine. Medicines may be over-the-counter or prescription. You may be told to use one or more of the following: Medicine that is taken by mouth (orally). Medicine that is applied as a cream (topically). Medicine that is inserted directly into the vagina (suppository). Follow these instructions at home: Take or apply over-the-counter and prescription medicines only as told by your health care provider. Do not use tampons until your health care provider approves. Do not have sex until your infection has cleared. Sex can prolong or worsen your symptoms of infection. Ask your health care provider when it is safe to resume sexual activity. Keep all follow-up visits. This is important. How is this prevented?  Do not wear tight clothes, such as pantyhose or tight pants. Wear breathable cotton underwear. Do not use douches, perfumed soap, creams, or powders. Wipe from front to back after using the toilet. If you have diabetes, keep your blood sugar levels under control. Ask your health care provider for other ways to prevent yeast infections. Contact a health care provider if: You have a fever. Your symptoms go away and then return. Your  symptoms do not get better with treatment. Your symptoms get worse. You have new symptoms. You develop blisters in or around your vagina. You have blood coming from your vagina and it is not your menstrual period. You develop pain in your abdomen. Summary Vaginal yeast infection is a condition that causes discharge as well as soreness, swelling, and redness (inflammation) of the vagina. This condition is treated with medicine. Medicines may be over-the-counter or prescription. Take or apply over-the-counter and prescription medicines only as told by your health care provider. Do not douche. Resume sexual activity or use of  tampons as instructed by your health care provider. Contact a health care provider if your symptoms do not get better with treatment or your symptoms go away and then return. This information is not intended to replace advice given to you by your health care provider. Make sure you discuss any questions you have with your health care provider. Document Revised: 10/31/2020 Document Reviewed: 10/31/2020 Elsevier Patient Education  [place].

## 2021-10-25 ENCOUNTER — Encounter: Payer: Self-pay | Admitting: Adult Health

## 2021-10-25 MED ORDER — OMEPRAZOLE 20 MG PO CPDR: 20.0000 mg | DELAYED_RELEASE_CAPSULE | Freq: Every day | ORAL | 11 refills | Status: DC

## 2021-10-31 ENCOUNTER — Encounter: Payer: Self-pay | Admitting: Physician Assistant

## 2021-11-06 ENCOUNTER — Other Ambulatory Visit: Payer: Self-pay

## 2021-11-06 ENCOUNTER — Encounter: Payer: Self-pay | Admitting: Pulmonary Disease

## 2021-11-06 ENCOUNTER — Ambulatory Visit (INDEPENDENT_AMBULATORY_CARE_PROVIDER_SITE_OTHER): Payer: 59 | Admitting: Pulmonary Disease

## 2021-11-06 VITALS — BP 118/60 | HR 75 | Temp 98.0°F | Ht 66.0 in | Wt 180.0 lb

## 2021-11-06 DIAGNOSIS — J449 Chronic obstructive pulmonary disease, unspecified: Secondary | ICD-10-CM | POA: Diagnosis not present

## 2021-11-06 MED ORDER — STIOLTO RESPIMAT 2.5-2.5 MCG/ACT IN AERS: 2.0000 | INHALATION_SPRAY | Freq: Every day | RESPIRATORY_TRACT | 0 refills | Status: DC

## 2021-11-06 MED ORDER — STIOLTO RESPIMAT 2.5-2.5 MCG/ACT IN AERS: 2.0000 | INHALATION_SPRAY | Freq: Every day | RESPIRATORY_TRACT | 5 refills | Status: DC

## 2021-11-06 NOTE — Patient Instructions (Signed)
We will send a prescription for stiolto inhaler ?I will check with pharmacy to see if this is covered or if there are cheaper alternatives ?Schedule PFTs and follow-up in 3 months. ?

## 2021-11-06 NOTE — Progress Notes (Unsigned)
KYLEIGH NANNINI    [id number]    04-11-1961  Primary Care Physician:Flinchum, Kelby Aline, FNP  Referring Physician: Doreen Beam, FNP [address],  [place] [address]  Chief complaint: Follow-up for COPD  HPI: Mrs. Cassel is a 61 year old with past medical history of heavy smoking, hypothyroidism. Complains of dyspnea on exertion since 2010. Symptoms are worsening over the past year. She cannot climb more than a flight of stairs. Her symptoms associated with occasional cough, nonproductive in nature and wheezing. She was given Symbicort several years ago but did not use it. She just on albuterol inhaler which she does not use often. She has stuffy nose but no seasonal allergies, postnasal drip. She has significant issues with acid reflux and is on Nexium. She had a normal EGD in 2016.   She's had workup as noted below which included a cardiac pulmonary exercise test. Evaluated by her cardiologist Dr. Einar Gip with echo suggestive of elevated right pressures (see report below). She has thyroid surgery as few years ago and noted weight gain since then. She is trying to lose weight with exercise program and diet.   78-pack-year smoker.  Quit in 2011.  Interim History: Returns to clinic after.  Of over 2 years She is not on any inhalers due to insurance issues Complains of worsening dyspnea on exertion.  No cough, fevers, chills We have stiolto Outpatient Encounter Medications as of 11/06/2021  Medication Sig   albuterol (PROVENTIL HFA;VENTOLIN HFA) 108 (90 Base) MCG/ACT inhaler Inhale 2 puffs into the lungs every 6 (six) hours as needed for wheezing or shortness of breath.   Cyanocobalamin (VITAMIN B-12 PO) Take by mouth daily.   fluticasone (FLONASE) 50 MCG/ACT nasal spray Place 2 sprays into both nostrils daily. (Patient taking differently: Place 2 sprays into both nostrils as needed.)   omeprazole (PRILOSEC) 20 MG capsule Take 1 capsule (20 mg total) by  mouth daily.   triamcinolone cream (KENALOG) 0.1 % Apply 1 application topically 2 (two) times daily. (Patient taking differently: Apply 1 application. topically as needed.)   Vitamin D, Ergocalciferol, (DRISDOL) 1.25 MG (50000 UNIT) CAPS capsule Take 1 capsule (50,000 Units total) by mouth every 7 (seven) days. (taking one tablet per week) schedule  lab in office 1-2 weeks after completing prescription.   meloxicam (MOBIC) 7.5 MG tablet Take 1 tablet (7.5 mg total) by mouth daily. (Patient not taking: Reported on 11/06/2021)   [DISCONTINUED] metroNIDAZOLE (FLAGYL) 500 MG tablet Take 1 tablet (500 mg total) by mouth 2 (two) times daily.   [DISCONTINUED] terconazole (TERAZOL 7) 0.4 % vaginal cream Place 1 applicator vaginally at bedtime.   No facility-administered encounter medications on file as of 11/06/2021.   Physical Exam: Blood pressure 118/60, pulse 75, temperature 98 F (36.7 C), temperature source Oral, height '5\' 6"'$  (1.676 m), weight 180 lb (81.6 kg), SpO2 98 %. Gen:      No acute distress HEENT:  EOMI, sclera anicteric Neck:     No masses; no thyromegaly Lungs:    Clear to auscultation bilaterally; normal respiratory effort CV:         Regular rate and rhythm; no murmurs Abd:      + bowel sounds; soft, non-tender; no palpable masses, no distension Ext:    No edema; adequate peripheral perfusion Skin:      Warm and dry; no rash Neuro: alert and oriented x 3 Psych: normal mood and affect   Data Reviewed:  Imaging CT scan 03/22/10 Small stable bilateral lung nodules, likely calcified granuloma. Bilateral upper lobe emphysematous changes.  CT chest 03/22/2016 2 mm right middle lobe nodule, multiple tiny calcified granulomas.  Mild centrilobular, paraseptal emphysema.  Aortic atherosclerosis.  CT chest 05/16/2018 Stable small calcified granulomas, mild emphysema, diffuse bronchial wall thickening.  I have reviewed the images personally.  CT chest 05/26/2019  PFTs: 07/19/14 FVC  2.31 (61%), FEV1 1.55 (52%), F/F 67, TLC 89%, RV/TLC 137, DLCO 39% Moderate obstructive disease with slight bronchial dilator response Severe diffusion defect which corrects partially for alveolar volume.  06/15/2018 FVC 2.28 [62%], FEV1 1.57 [55%), F/F 69, TLC 87%, RV/TLC 152%, DLCO 72% Severe obstruction with bronchodilator response, air trapping, minimal diffusion defect.  Cardiac CPET 01/02/16 Exercise testing with gas exchange demonstrates normal functional capacity when compared to matched sedentary Norms.There is no cardiac limitation noted.  At peak exercise, patient appears limited due to her body habitus with related restrictive lung physiology and mild obstruction. Would recommend continued follow-up with Pulmonary as well as weight loss.    Echocardiogram 12/27/15 Normal LV cavity size and function, normal diastolic filling pattern EF 61% Trace MR, mild TR. No evidence of pulmonary hypertension. Trace PR. IVC is dilated with respiratory variation. Suggest elevated right heart pressure.   Labs: CBC 07/23/2017-WBC 8.6, eos 0.4%, absolute eosinophil count 35  Assessment:  COPD PFTs show severe obstruction, or trapping and a bronchodilator response She will benefit from an inhaler but has not started due to insurance issues We will give her samples of Anoro.  Offered follow-up with the pharmacy clinic to explore patient assistance options but she wants to hold off for now. She did not desat on exertion today.   CPET shows exercise limitation from obesity and deconditioning. I encouraged weight loss and exercise. Check baseline labs including CBC differential, IgE, alpha-1 antitrypsin levels and phenotype  Pulmonary nodules Likely benign, suggestive of calcified granulomas Follow annual screening.  Health maintenance 05/27/2014-Pneumovax Wants to wait for a few weeks before getting the flu vaccine  Plan/Recommendations: - Start anoro, continue albuterol rescue  inhaler - Continue annual screening CT of the chest  CC: Flinchum, Kelby Aline, F*

## 2021-11-07 ENCOUNTER — Other Ambulatory Visit (HOSPITAL_COMMUNITY): Payer: Self-pay

## 2021-11-08 ENCOUNTER — Telehealth: Payer: Self-pay | Admitting: Pulmonary Disease

## 2021-11-08 NOTE — Telephone Encounter (Signed)
Called patient and she wanted to let Dr Vaughan Browner know that she is going to wait to start taking her Stiolto inhaler until after she talks to her heart doctor. She states that she is going to call her heart doctor and hopefully get an appointment in about a week or so. She states she has been having heart palpitations and wanted to clear it with the heart doctor. ? ?I advised her to call some local pharmacies to see who has the Albuterol in stock around her home and to call us and let us know and we would call it in for her.  ? ?JUST FYI ?

## 2021-11-09 NOTE — Telephone Encounter (Signed)
Patient states Megan Harper has Albuterol inhaler. Patient phone number is [phone]. ?

## 2021-11-09 NOTE — Telephone Encounter (Signed)
ATC patient x2. Received busy dial.  ?Will call back.  ?

## 2021-11-09 NOTE — Telephone Encounter (Signed)
Lm x1 for patient.  

## 2021-11-10 ENCOUNTER — Other Ambulatory Visit: Payer: Self-pay

## 2021-11-10 DIAGNOSIS — Z87891 Personal history of nicotine dependence: Secondary | ICD-10-CM

## 2021-11-10 MED ORDER — ALBUTEROL SULFATE HFA 108 (90 BASE) MCG/ACT IN AERS: 2.0000 | INHALATION_SPRAY | Freq: Four times a day (QID) | RESPIRATORY_TRACT | 3 refills | Status: AC | PRN

## 2021-11-10 NOTE — Telephone Encounter (Signed)
Patient returned call, verified pharmacy and script sent for albuterol inhaler.  Nothing further needed. ?

## 2021-11-11 ENCOUNTER — Encounter: Payer: Self-pay | Admitting: Pulmonary Disease

## 2021-11-13 ENCOUNTER — Ambulatory Visit (INDEPENDENT_AMBULATORY_CARE_PROVIDER_SITE_OTHER): Payer: 59 | Admitting: Adult Health

## 2021-11-13 ENCOUNTER — Other Ambulatory Visit: Payer: Self-pay

## 2021-11-13 ENCOUNTER — Encounter: Payer: Self-pay | Admitting: Adult Health

## 2021-11-13 VITALS — BP 122/71 | HR 69 | Ht 66.75 in | Wt 180.8 lb

## 2021-11-13 DIAGNOSIS — M2550 Pain in unspecified joint: Secondary | ICD-10-CM

## 2021-11-13 DIAGNOSIS — B350 Tinea barbae and tinea capitis: Secondary | ICD-10-CM

## 2021-11-13 DIAGNOSIS — Z0000 Encounter for general adult medical examination without abnormal findings: Secondary | ICD-10-CM | POA: Diagnosis not present

## 2021-11-13 DIAGNOSIS — Z1389 Encounter for screening for other disorder: Secondary | ICD-10-CM | POA: Diagnosis not present

## 2021-11-13 DIAGNOSIS — I83813 Varicose veins of bilateral lower extremities with pain: Secondary | ICD-10-CM | POA: Diagnosis not present

## 2021-11-13 DIAGNOSIS — Z1231 Encounter for screening mammogram for malignant neoplasm of breast: Secondary | ICD-10-CM | POA: Diagnosis not present

## 2021-11-13 DIAGNOSIS — J302 Other seasonal allergic rhinitis: Secondary | ICD-10-CM

## 2021-11-13 DIAGNOSIS — R238 Other skin changes: Secondary | ICD-10-CM

## 2021-11-13 LAB — HEMOGLOBIN A1C: Hgb A1c MFr Bld: 5.5 % (ref 4.6–6.5)

## 2021-11-13 LAB — COMPREHENSIVE METABOLIC PANEL
ALT: 23 U/L (ref 0–35)
AST: 20 U/L (ref 0–37)
Albumin: 4.4 g/dL (ref 3.5–5.2)
Alkaline Phosphatase: 80 U/L (ref 39–117)
BUN: 14 mg/dL (ref 6–23)
CO2: 32 mEq/L (ref 19–32)
Calcium: 9.2 mg/dL (ref 8.4–10.5)
Chloride: 101 mEq/L (ref 96–112)
Creatinine, Ser: 0.66 mg/dL (ref 0.40–1.20)
GFR: 95.83 mL/min (ref >60.00)
Glucose, Bld: 87 mg/dL (ref 70–99)
Potassium: 3.9 mEq/L (ref 3.5–5.1)
Sodium: 140 mEq/L (ref 135–145)
Total Bilirubin: 0.6 mg/dL (ref 0.2–1.2)
Total Protein: 7.2 g/dL (ref 6.0–8.3)

## 2021-11-13 LAB — URINALYSIS, MICROSCOPIC ONLY

## 2021-11-13 LAB — CBC WITH DIFFERENTIAL/PLATELET
Basophils Absolute: 0.1 10*3/uL (ref 0.0–0.1)
Basophils Relative: 2 % (ref 0.0–3.0)
Eosinophils Absolute: 0.1 10*3/uL (ref 0.0–0.7)
Eosinophils Relative: 1.1 % (ref 0.0–5.0)
HCT: 40.2 % (ref 36.0–46.0)
Hemoglobin: 13.5 g/dL (ref 12.0–15.0)
Lymphocytes Relative: 18.7 % (ref 12.0–46.0)
Lymphs Abs: 1.1 10*3/uL (ref 0.7–4.0)
MCHC: 33.7 g/dL (ref 30.0–36.0)
MCV: 98.1 fl (ref 78.0–100.0)
Monocytes Absolute: 0.3 10*3/uL (ref 0.1–1.0)
Monocytes Relative: 5.7 % (ref 3.0–12.0)
Neutro Abs: 4.4 10*3/uL (ref 1.4–7.7)
Neutrophils Relative %: 72.5 % (ref 43.0–77.0)
Platelets: 280 10*3/uL (ref 150.0–400.0)
RBC: 4.1 Mil/uL (ref 3.87–5.11)
RDW: 12.8 % (ref 11.5–15.5)
WBC: 6.1 10*3/uL (ref 4.0–10.5)

## 2021-11-13 LAB — LIPID PANEL
Cholesterol: 185 mg/dL (ref 0–200)
HDL: 56.7 mg/dL (ref >39.00)
LDL Cholesterol: 116 mg/dL — ABNORMAL HIGH (ref 0–99)
NonHDL: 128.15
Total CHOL/HDL Ratio: 3
Triglycerides: 63 mg/dL (ref 0.0–149.0)
VLDL: 12.6 mg/dL (ref 0.0–40.0)

## 2021-11-13 LAB — VITAMIN D 25 HYDROXY (VIT D DEFICIENCY, FRACTURES): VITD: 54.74 ng/mL (ref 30.00–100.00)

## 2021-11-13 LAB — TSH: TSH: 2.4 u[IU]/mL (ref 0.35–5.50)

## 2021-11-13 MED ORDER — MELOXICAM 7.5 MG PO TABS: 7.5000 mg | ORAL_TABLET | Freq: Every day | ORAL | 1 refills | Status: DC

## 2021-11-13 MED ORDER — KETOCONAZOLE 1 % EX SHAM: 1.0000 "application " | MEDICATED_SHAMPOO | CUTANEOUS | 1 refills | Status: DC

## 2021-11-13 MED ORDER — TRIAMCINOLONE ACETONIDE 0.1 % EX CREA: 1.0000 "application " | TOPICAL_CREAM | Freq: Two times a day (BID) | CUTANEOUS | 0 refills | Status: DC | PRN

## 2021-11-13 MED ORDER — FLUTICASONE PROPIONATE 50 MCG/ACT NA SUSP: 2.0000 | Freq: Every day | NASAL | 4 refills | Status: DC

## 2021-11-13 MED ORDER — CETIRIZINE HCL 10 MG PO TABS: 10.0000 mg | ORAL_TABLET | Freq: Every day | ORAL | 4 refills | Status: DC

## 2021-11-13 NOTE — Patient Instructions (Signed)

## 2021-11-13 NOTE — Progress Notes (Signed)
Urinalysis is negative

## 2021-11-13 NOTE — Progress Notes (Signed)
[id number] ? ?Established Patient Office Visit ? ?Subjective:  ?Patient ID: Megan Harper, female    DOB: 1961-04-16  Age: 61 y.o. MRN: [id number] ? ?CC:  ?Chief Complaint  ?Patient presents with  ? Annual Exam  ? ? ?HPI ?[name] presents for annual exam.  ? ? ?Lung scan scheduled for 11/30/21 she has scheduled.  ? ?Mammogram is ordered she will call to schedule.  ?Endoscopy and colonoscopy was last 09/28/2011 ? ? ?Was referred back to Dr. Einar Gip she requested as he has been her cardiologist- she has had ECHO and IVC was dilated 12/2015 and trace mitral regurgitation, EF 61 %. Mild tricuspid regurgitation  no evidence of pulmonary hypertension.  ? ?Has labs and follow up with hematology oncology for multiple myeloma work up. Also seen rheumatology for arthralgias. Also seen orthopedics still followed. ?Previously referred to [facility]  dermatology for skin check. ? ?She has a follow up with Gynecology scheduled was previously referred due to atypical pap. She will wait and have this rechecked with gynecology so it will be covered by insurance. She is scheduled in May with gynecology. Dr. Kennon Rounds 01/17/22 [place] [facility]. ?Cytology - PAP ?Order: [id number] ?Status: Edited Result - FINAL    ?Visible to patient: Yes (seen)    ?Next appt: 11/14/2021 at 02:30 PM in Gastroenterology (Amy Trellis Paganini, PA-C)    ?Dx: Screening for cervical cancer    ?0 Result Notes   ?1 HM Topic ?   ?Component 11 mo ago  ?High risk HPV Negative   ?Adequacy Satisfactory for evaluation; transformation zone component PRESENT.   ?Diagnosis - Atypical squamous cells of undetermined significance (ASC-US) Abnormal    ?Comment Normal Reference Range HPV - Negative   ?Resulting Agency [facility]  ?  ? ? ?Patient  denies any fever, body aches,chills, rash, chest pain, shortness of breath, nausea, vomiting, or diarrhea.  ?Denies dizziness, lightheadedness, pre syncopal or syncopal episodes.  ?  ? ? ?Past Medical History:  ?Diagnosis Date  ? Abnormal SPEP   ?  Anxiety   ? Arthralgia   ? Cervical dysplasia   ? COPD (chronic obstructive pulmonary disease) ([place])   ? Emphysema of lung ([place])   ? Endometriosis   ? Fibrocystic breast disease   ? GERD (gastroesophageal reflux disease)   ? Osteoporosis   ? Thyroid nodule   ? Vitamin D insufficiency   ? ? ?Past Surgical History:  ?Procedure Laterality Date  ? laproscopy for endometriosis    ? THYROID LOBECTOMY Right 03/08/2010  ? TUBAL LIGATION  01/23/1986  ? ? ?Family History  ?Problem Relation Age of Onset  ? Uterine cancer Mother   ? Lung cancer Father   ?     smoked  ? Esophageal cancer Maternal Aunt   ? Lymphoma Maternal Aunt   ? Kidney cancer Maternal Uncle   ? Prostate cancer Maternal Uncle   ? Heart disease Maternal Uncle   ? Heart disease Maternal Grandmother   ? Hypertension Maternal Grandmother   ? Thyroid disease Maternal Grandmother   ? Throat cancer Maternal Grandmother   ? Prostate cancer Maternal Grandfather   ? Hypertension Maternal Grandfather   ? Colon cancer Maternal Grandfather   ? Kidney cancer Maternal Grandfather   ? Heart disease Paternal Grandmother   ? Diabetes Paternal Grandmother   ? ? ?Social History  ? ?Socioeconomic History  ? Marital status: Married  ?  Spouse name: Not on file  ? Number of children: 3  ? Years of  education: Not on file  ? Highest education level: Not on file  ?Occupational History  ? Not on file  ?Tobacco Use  ? Smoking status: Former  ?  Packs/day: 2.50  ?  Years: 31.00  ?  Pack years: 77.50  ?  Types: Cigarettes  ?  Quit date: 09/17/2009  ?  Years since quitting: 12.1  ? Smokeless tobacco: Never  ?Vaping Use  ? Vaping Use: Never used  ?Substance and Sexual Activity  ? Alcohol use: No  ? Drug use: No  ? Sexual activity: Yes  ?  Birth control/protection: Surgical  ?Other Topics Concern  ? Not on file  ?Social History Narrative  ? Lives in [place]; with husband- 3 dogs. Quit smoking 2010. No alcohol. Used to work at [employer].   ? ?Social Determinants of Health  ? ?Financial  Resource Strain: Not on file  ?Food Insecurity: Not on file  ?Transportation Needs: Not on file  ?Physical Activity: Not on file  ?Stress: Not on file  ?Social Connections: Not on file  ?Intimate Partner Violence: Not on file  ? ? ?Outpatient Medications Prior to Visit  ?Medication Sig Dispense Refill  ? albuterol (VENTOLIN HFA) 108 (90 Base) MCG/ACT inhaler Inhale 2 puffs into the lungs every 6 (six) hours as needed for wheezing or shortness of breath. 18 g 3  ? albuterol (PROVENTIL HFA;VENTOLIN HFA) 108 (90 Base) MCG/ACT inhaler Inhale 2 puffs into the lungs every 6 (six) hours as needed for wheezing or shortness of breath. 1 Inhaler 2  ? omeprazole (PRILOSEC) 20 MG capsule Take 1 capsule (20 mg total) by mouth daily. 30 capsule 11  ? triamcinolone cream (KENALOG) 0.1 % Apply 1 application topically 2 (two) times daily. (Patient taking differently: Apply 1 application. topically as needed.) 30 g 0  ? Vitamin D, Ergocalciferol, (DRISDOL) 1.25 MG (50000 UNIT) CAPS capsule Take 1 capsule (50,000 Units total) by mouth every 7 (seven) days. (taking one tablet per week) schedule  lab in office 1-2 weeks after completing prescription. 12 capsule 0  ? Cyanocobalamin (VITAMIN B-12 PO) Take by mouth daily. (Patient not taking: Reported on 11/13/2021)    ? Tiotropium Bromide-Olodaterol (STIOLTO RESPIMAT) 2.5-2.5 MCG/ACT AERS Inhale 2 puffs into the lungs daily. (Patient not taking: Reported on 11/13/2021) 4 g 5  ? Tiotropium Bromide-Olodaterol (STIOLTO RESPIMAT) 2.5-2.5 MCG/ACT AERS Inhale 2 puffs into the lungs daily. (Patient not taking: Reported on 11/13/2021) 4 g 0  ? fluticasone (FLONASE) 50 MCG/ACT nasal spray Place 2 sprays into both nostrils daily. (Patient not taking: Reported on 11/13/2021) 18.2 mL 2  ? meloxicam (MOBIC) 7.5 MG tablet Take 1 tablet (7.5 mg total) by mouth daily. (Patient not taking: Reported on 11/13/2021) 90 tablet 0  ? ?No facility-administered medications prior to visit.  ? ? ?Allergies  ?Allergen  Reactions  ? Ceclor [Cefaclor] Other (See Comments)  ?  Flu like symptoms  ? Pantoprazole Itching  ? ? ?ROS ?Review of Systems  ?Constitutional: Negative.   ?HENT: Negative.    ?Respiratory: Negative.    ?Cardiovascular: Negative.   ?Gastrointestinal: Negative.   ?Genitourinary: Negative.   ?Musculoskeletal:  Positive for arthralgias. Negative for gait problem.  ?Skin:  Positive for rash (flakey itching  scalp).  ? ?  ?Objective:  ?  ?Physical Exam ?Genitourinary: ?   Comments: Declined by patient she is scheduled with gynecology and due for PAP prefers to wait so insurance will cover.  ?Skin: ?   General: Skin is warm.  ?  Coloration: Skin is not jaundiced or pale.  ?   Findings: Rash present. No bruising, erythema or lesion.  ?   Comments: Dry flakes of skin behind ears and in lower scalp mild erythema. No warmth, lesions or drainage notes.   ? ? ?BP 122/71 (BP Location: Left Arm, Patient Position: Sitting, Cuff Size: Large)   Pulse 69   Ht 5' 6.75" (1.695 m)   Wt 180 lb 12.8 oz (82 kg)   SpO2 98%   BMI 28.53 kg/m?  ?Wt Readings from Last 3 Encounters:  ?11/14/21 180 lb (81.6 kg)  ?11/13/21 180 lb 12.8 oz (82 kg)  ?11/06/21 180 lb (81.6 kg)  ? ? ?General: Appearance:     ?Overweight female in no acute distress  ?Eyes:    PERRL, conjunctiva/corneas clear, EOM's intact       ?Lungs:     Clear to auscultation bilaterally, respirations unlabored  ?Heart:    Normal heart rate. Normal rhythm. No murmurs, rubs, or gallops.  ?  ?MS:   All extremities are intact.  ?  ?Neurologic:   Awake, alert, oriented x 3. No apparent focal neurological           defect.   ? Abdomen: soft and non-tender without masses, organomegaly or hernias noted.  No guarding or rebound  ? ?There are no preventive care reminders to display for this patient. ? ?There are no preventive care reminders to display for this patient. ? ?Lab Results  ?Component Value Date  ? TSH 2.40 11/13/2021  ? ?Lab Results  ?Component Value Date  ? WBC 6.1  11/13/2021  ? HGB 13.5 11/13/2021  ? HCT 40.2 11/13/2021  ? MCV 98.1 11/13/2021  ? PLT 280.0 11/13/2021  ? ?Lab Results  ?Component Value Date  ? NA 140 11/13/2021  ? K 3.9 11/13/2021  ? CO2 32 11/13/2021  ? GLUCOSE 87 03/2

## 2021-11-14 ENCOUNTER — Ambulatory Visit (INDEPENDENT_AMBULATORY_CARE_PROVIDER_SITE_OTHER): Payer: 59 | Admitting: Physician Assistant

## 2021-11-14 ENCOUNTER — Telehealth: Payer: Self-pay

## 2021-11-14 ENCOUNTER — Encounter: Payer: Self-pay | Admitting: Physician Assistant

## 2021-11-14 ENCOUNTER — Encounter: Payer: Self-pay | Admitting: Adult Health

## 2021-11-14 VITALS — BP 130/70 | HR 67 | Ht 66.0 in | Wt 180.0 lb

## 2021-11-14 DIAGNOSIS — R140 Abdominal distension (gaseous): Secondary | ICD-10-CM | POA: Diagnosis not present

## 2021-11-14 DIAGNOSIS — R6881 Early satiety: Secondary | ICD-10-CM | POA: Diagnosis not present

## 2021-11-14 DIAGNOSIS — R198 Other specified symptoms and signs involving the digestive system and abdomen: Secondary | ICD-10-CM

## 2021-11-14 DIAGNOSIS — K5900 Constipation, unspecified: Secondary | ICD-10-CM | POA: Diagnosis not present

## 2021-11-14 DIAGNOSIS — K219 Gastro-esophageal reflux disease without esophagitis: Secondary | ICD-10-CM

## 2021-11-14 LAB — URINE CULTURE
MICRO NUMBER:: 13152378
Result:: NO GROWTH
SPECIMEN QUALITY:: ADEQUATE

## 2021-11-14 MED ORDER — OMEPRAZOLE 20 MG PO CPDR: 20.0000 mg | DELAYED_RELEASE_CAPSULE | Freq: Two times a day (BID) | ORAL | 6 refills | Status: DC

## 2021-11-14 NOTE — Progress Notes (Signed)
LDL elevated.  Discuss lifestyle modification with patient e.g. increase exercise, fiber, fruits, vegetables, lean meat, and omega 3/fish intake and decrease saturated fat.  If patient following strict diet and exercise program already please schedule follow up appointment with primary care physician.  ? ?Vitamin D is within normal limits.  ?CBC within normal limits. CMP within normal limits.  ?Hemoglobin A1C is within normal limits.  ?TSH thyroid within normal limits.  ?Urine culture still pending.

## 2021-11-14 NOTE — Telephone Encounter (Signed)
Mychart msg sent

## 2021-11-14 NOTE — Patient Instructions (Signed)
If you are age 60 or younger, your body mass index should be between 19-25. Your Body mass index is 29.05 kg/m?Marland Kitchen If this is out of the aformentioned range listed, please consider follow up with your Primary Care Provider.  ?________________________________________________________ ? ?The [facility] providers would like to encourage you to use [facility] to communicate with providers for non-urgent requests or questions.  Due to long hold times on the telephone, sending your provider a message by [facility] may be a faster and more efficient way to get a response.  Please allow 48 business hours for a response.  Please remember that this is for non-urgent requests.  ?_______________________________________________________ ? ?You have been scheduled for a gastric emptying scan at [facility] on 11/21/2021 at 7:30 am. Please arrive at least 15 minutes prior to your appointment for registration. Please make certain not to have anything to eat or drink after midnight the night before your test. Hold all stomach medications (ex: Zofran, phenergan, Reglan) 48 hours prior to your test. If you need to reschedule your appointment, please contact radiology scheduling at [phone]. ?_____________________________________________________________________ ?A gastric-emptying study measures how long it takes for food to move through your stomach. There are several ways to measure stomach emptying. In the most common test, you eat food that contains a small amount of radioactive material. A scanner that detects the movement of the radioactive material is placed over your abdomen to monitor the rate at which food leaves your stomach. This test normally takes about 4 hours to complete. ?_____________________________________________________________________ ? ?You have been scheduled for a CT scan of the abdomen and pelvis at [facility], 1st floor Radiology. You are scheduled on 11/23/2021  at 8:00 am. You should arrive 15  minutes prior to your appointment time for registration.  Please pick up 2 bottles of contrast from [facility] at least 3 days prior to your scan. The solution may taste better if refrigerated, but do NOT add ice or any other liquid to this solution. Shake well before drinking.  ? ?Please follow the written instructions below on the day of your exam:  ? ?1) Do not eat anything after 4:00 am (4 hours prior to your test)  ? ?2) Drink 1 bottle of contrast @ 6:00 ma (2 hours prior to your exam)  Remember to shake well before drinking and do NOT pour over ice. ?    Drink 1 bottle of contrast @ 7:00 am (1 hour prior to your exam)  ? ?You may take any medications as prescribed with a small amount of water, if necessary. If you take any of the following medications: METFORMIN, GLUCOPHAGE, GLUCOVANCE, AVANDAMET, RIOMET, FORTAMET, ACTOPLUS MET, JANUMET, [place] or METAGLIP, you MAY be asked to HOLD this medication 48 hours AFTER the exam.  ? ?The purpose of you drinking the oral contrast is to aid in the visualization of your intestinal tract. The contrast solution may cause some diarrhea. Depending on your individual set of symptoms, you may also receive an intravenous injection of x-ray contrast/dye. Plan on being at [facility] for 45 minutes or longer, depending on the type of exam you are having performed.  ? ?If you have any questions regarding your exam or if you need to reschedule, you may call [facility] at [phone] between the hours of 8:00 am and 5:00 pm, Monday-Friday.  ? ?Increase Omeprazole to 20 mg twice daily ? ?START Gas-X 2 tablets with each meal ? ?START Miralax 1 capful in 8 ounces  of water or juice daily. ? ?Follow a low gas diet ? ?Try to stay off Meloxicam. ? ?Follow up pending the results of your imaging. ? ?Thank you for entrusting me with your care and choosing [facility]. ? ?Amy Esterwood, PA-C ? ? ?

## 2021-11-15 ENCOUNTER — Telehealth: Payer: Self-pay | Admitting: Adult Health

## 2021-11-15 ENCOUNTER — Encounter: Payer: Self-pay | Admitting: Physician Assistant

## 2021-11-15 NOTE — Telephone Encounter (Signed)
Rise Paganini calling from [facility] wanting to talk to referral coordinator about pt ?

## 2021-11-15 NOTE — Progress Notes (Signed)
? ?Subjective:  ? ? Patient ID: Megan Harper, female    DOB: 15-Nov-1960, 61 y.o.   MRN: [id number] ? ?HPI ?Megan Harper is a 61 year old white female, established with Dr. Henrene Pastor, last seen in our office in 2018 who comes in today for evaluation of ongoing symptoms of abdominal pressure and bloating primarily in the upper abdomen, and occasional episodes of right-sided abdominal pain. ?When patient was last seen here in 2018 she did have similar complaints.  She says the symptoms have been ongoing over the past for 5 years, some progression.  She says she always feels miserable full and bloated in the upper abdomen and feels distended and uncomfortable after eating.  She has been having nausea off and on without vomiting, no regular heartburn or indigestion symptoms, no dysphagia or odynophagia.  She has had some increased reflux type symptoms.  She describes occasional choking episodes but sounds like primarily due to irritants like pepper.  She is also describing increased irritation in her throat and what sounds like sinus drainage causes her to have to continue to feel like she needs to swallow. ?Bowel movements have been constipated, usually going 2 to 3 days between bowel movements, generally not taking any laxatives.  Weight has been fairly stable. ?Patient does have history of severe COPD, no oxygen use, polyarthralgias, MGUS, and hypothyroidism. ?Last EGD and colonoscopy were done in 2016, at colonoscopy she had 1 hyperplastic 3 mm polyp and otherwise negative exam at EGD normal esophagus stomach and duodenum and felt to have symptoms secondary to IBS. ?She had CT scan in 2018 showed mild laxity of the ventral abdominal wall at the umbilicus without hernia. ?Most recent labs February 2023 with CBC, c-Met and TSH within normal limits. ?She is scheduled for upcoming chest CT/lung cancer screening 11/30/2021 ? ?Does usually drink 1 carbonated beverage per day, no artificial sweeteners, does not feel that she has lactose  intolerant. ?She does take Mobic on a daily basis for arthritic symptoms, and has chronically been on low-dose omeprazole 20 mg. ? ?Review of Systems. Pertinent positive and negative review of systems were noted in the above HPI section.  All other review of systems was otherwise negative.  ? ?Outpatient Encounter Medications as of 11/14/2021  ?Medication Sig  ? albuterol (VENTOLIN HFA) 108 (90 Base) MCG/ACT inhaler Inhale 2 puffs into the lungs every 6 (six) hours as needed for wheezing or shortness of breath.  ? cetirizine (ZYRTEC) 10 MG tablet Take 1 tablet (10 mg total) by mouth daily.  ? fluticasone (FLONASE) 50 MCG/ACT nasal spray Place 2 sprays into both nostrils daily.  ? KETOCONAZOLE, TOPICAL, 1 % SHAM Apply 1 application. topically 2 (two) times a week.  ? triamcinolone cream (KENALOG) 0.1 % Apply 1 application. topically 2 (two) times daily as needed.  ? [DISCONTINUED] omeprazole (PRILOSEC) 20 MG capsule Take 1 capsule (20 mg total) by mouth daily.  ? Cyanocobalamin (VITAMIN B-12 PO) Take by mouth daily. (Patient not taking: Reported on 11/13/2021)  ? meloxicam (MOBIC) 7.5 MG tablet Take 1 tablet (7.5 mg total) by mouth daily. Hold until cleared by gastroenterology. (Patient not taking: Reported on 11/14/2021)  ? omeprazole (PRILOSEC) 20 MG capsule Take 1 capsule (20 mg total) by mouth 2 (two) times daily before a meal.  ? Tiotropium Bromide-Olodaterol (STIOLTO RESPIMAT) 2.5-2.5 MCG/ACT AERS Inhale 2 puffs into the lungs daily. (Patient not taking: Reported on 11/13/2021)  ? Tiotropium Bromide-Olodaterol (STIOLTO RESPIMAT) 2.5-2.5 MCG/ACT AERS Inhale 2 puffs into the lungs daily. (Patient  not taking: Reported on 11/13/2021)  ? [DISCONTINUED] albuterol (PROVENTIL HFA;VENTOLIN HFA) 108 (90 Base) MCG/ACT inhaler Inhale 2 puffs into the lungs every 6 (six) hours as needed for wheezing or shortness of breath.  ? ?No facility-administered encounter medications on file as of 11/14/2021.  ? ?Allergies  ?Allergen  Reactions  ? Ceclor [Cefaclor] Other (See Comments)  ?  Flu like symptoms  ? Pantoprazole Itching  ? ?Patient Active Problem List  ? Diagnosis Date Noted  ? Monoclonal gammopathy of unknown significance (MGUS) 10/03/2021  ? Urinary symptom or sign 09/21/2021  ? Vaginal itching 09/21/2021  ? Vaginal candidiasis 09/20/2021  ? Bacterial vaginosis 09/20/2021  ? Arthralgia 07/17/2021  ? Lumbar pain 04/04/2021  ? ASCUS of cervix with negative high risk HPV 03/15/2021  ? Cellulitis of skin- left ear lobe  01/26/2021  ? Contact dermatitis 01/26/2021  ? Itching 01/26/2021  ? Abnormal cervical Papanicolaou smear 01/26/2021  ? Tick bite of lower back 01/26/2021  ? Screening for cervical cancer 12/14/2020  ? Need for tetanus booster 12/14/2020  ? Need for hepatitis C screening test 12/14/2020  ? B12 deficiency 12/14/2020  ? Hyperlipidemia 12/14/2020  ? Abdominal bloating 12/14/2020  ? Chronic LLQ pain 12/14/2020  ? Actinic keratoses 12/14/2020  ? Hx of moderate sun exposure 12/14/2020  ? Adnexal tenderness, left 12/14/2020  ? Fatigue 12/14/2020  ? Recurrent acute serous otitis media of left ear 11/16/2020  ? Vitamin D insufficiency 11/16/2020  ? H/O partial thyroidectomy- right side.  11/16/2020  ? Palpitations 11/16/2020  ? Gastroesophageal reflux disease 11/16/2020  ? Hypothyroidism 11/16/2020  ? Menopausal symptom 11/16/2020  ? Seasonal allergies 11/16/2020  ? COPD with chronic bronchitis and emphysema ([place]) 06/29/2020  ? Former smoker 06/29/2020  ? History of COVID-19 06/29/2020  ? Medication management 06/29/2020  ? Screening for blood or protein in urine 06/29/2020  ? History of thyroid nodule 08/12/2016  ? Dyspnea 10/28/2015  ? Upper airway cough syndrome 10/28/2015  ? ?Social History  ? ?Socioeconomic History  ? Marital status: Married  ?  Spouse name: Not on file  ? Number of children: 3  ? Years of education: Not on file  ? Highest education level: Not on file  ?Occupational History  ? Not on file  ?Tobacco Use  ?  Smoking status: Former  ?  Packs/day: 2.50  ?  Years: 31.00  ?  Pack years: 77.50  ?  Types: Cigarettes  ?  Quit date: 09/17/2009  ?  Years since quitting: 12.1  ? Smokeless tobacco: Never  ?Vaping Use  ? Vaping Use: Never used  ?Substance and Sexual Activity  ? Alcohol use: No  ? Drug use: No  ? Sexual activity: Yes  ?  Birth control/protection: Surgical  ?Other Topics Concern  ? Not on file  ?Social History Narrative  ? Lives in [place]; with husband- 3 dogs. Quit smoking 2010. No alcohol. Used to work at [employer].   ? ?Social Determinants of Health  ? ?Financial Resource Strain: Not on file  ?Food Insecurity: Not on file  ?Transportation Needs: Not on file  ?Physical Activity: Not on file  ?Stress: Not on file  ?Social Connections: Not on file  ?Intimate Partner Violence: Not on file  ? ? ?Ms. Pasquarella's family history includes Colon cancer in her maternal grandfather; Diabetes in her paternal grandmother; Esophageal cancer in her maternal aunt; Heart disease in her maternal grandmother, maternal uncle, and paternal grandmother; Hypertension in her maternal grandfather and maternal grandmother; Kidney  cancer in her maternal grandfather and maternal uncle; Lung cancer in her father; Lymphoma in her maternal aunt; Prostate cancer in her maternal grandfather and maternal uncle; Throat cancer in her maternal grandmother; Thyroid disease in her maternal grandmother; Uterine cancer in her mother. ? ? ?   ?Objective:  ?  ?Vitals:  ? 11/14/21 1439  ?BP: 130/70  ?Pulse: 67  ? ? ?Physical Exam. Well-developed well-nourished older white female in no acute distress.   Weight, 180 BMI 29.0 ? ?HEENT; nontraumatic normocephalic, EOMI, PE R LA, sclera anicteric. ?Oropharynx; not examined today ?Neck; supple, no JVD ?Cardiovascular; regular rate and rhythm with S1-S2, no murmur rub or gallop ?Pulmonary; Clear bilaterally, creased breath sounds bilateral ?Abdomen; soft,  nondistended, no succussion splash ,there is mild  rather diffuse tenderness no guarding or rebound no palpable mass or hepatosplenomegaly, bowel sounds are active ?Rectal; not done today ?Skin; benign exam, no jaundice rash or appreciable lesions ?Extremities;

## 2021-11-16 ENCOUNTER — Telehealth: Payer: Self-pay | Admitting: Adult Health

## 2021-11-16 NOTE — Telephone Encounter (Signed)
Patient would like a call back about her labs. She said she was suppose to have her B12 drawn. Also please change referral [place] for the Mammogram. ?

## 2021-11-21 ENCOUNTER — Ambulatory Visit (HOSPITAL_COMMUNITY): Payer: 59

## 2021-11-21 NOTE — Progress Notes (Signed)
Assessment and plans reviewed  

## 2021-11-23 ENCOUNTER — Ambulatory Visit (HOSPITAL_COMMUNITY)
Admission: RE | Admit: 2021-11-23 | Discharge: 2021-11-23 | Disposition: A | Discharge status: Home / Self Care | Payer: 59 | Source: Ambulatory Visit | Attending: Physician Assistant | Admitting: Physician Assistant

## 2021-11-23 DIAGNOSIS — R6881 Early satiety: Secondary | ICD-10-CM | POA: Insufficient documentation

## 2021-11-23 DIAGNOSIS — K219 Gastro-esophageal reflux disease without esophagitis: Secondary | ICD-10-CM | POA: Insufficient documentation

## 2021-11-23 DIAGNOSIS — K5900 Constipation, unspecified: Secondary | ICD-10-CM | POA: Insufficient documentation

## 2021-11-23 DIAGNOSIS — R198 Other specified symptoms and signs involving the digestive system and abdomen: Secondary | ICD-10-CM | POA: Diagnosis present

## 2021-11-23 DIAGNOSIS — R140 Abdominal distension (gaseous): Secondary | ICD-10-CM | POA: Diagnosis present

## 2021-11-23 MED ORDER — SODIUM CHLORIDE (PF) 0.9 % IJ SOLN
INTRAMUSCULAR | Status: AC
  Filled 2021-11-23: qty 50

## 2021-11-23 MED ORDER — IOHEXOL 300 MG/ML  SOLN
100.0000 mL | Freq: Once | INTRAMUSCULAR | Status: AC | PRN
  Administered 2021-11-23: 100 mL via INTRAVENOUS

## 2021-11-30 ENCOUNTER — Other Ambulatory Visit: Payer: Self-pay

## 2021-11-30 ENCOUNTER — Ambulatory Visit
Admission: RE | Admit: 2021-11-30 | Discharge: 2021-11-30 | Disposition: A | Discharge status: Home / Self Care | Payer: 59 | Source: Ambulatory Visit | Attending: Acute Care | Admitting: Acute Care

## 2021-11-30 DIAGNOSIS — J432 Centrilobular emphysema: Secondary | ICD-10-CM | POA: Insufficient documentation

## 2021-11-30 DIAGNOSIS — Z122 Encounter for screening for malignant neoplasm of respiratory organs: Secondary | ICD-10-CM | POA: Insufficient documentation

## 2021-11-30 DIAGNOSIS — Z87891 Personal history of nicotine dependence: Secondary | ICD-10-CM | POA: Insufficient documentation

## 2021-11-30 DIAGNOSIS — E041 Nontoxic single thyroid nodule: Secondary | ICD-10-CM | POA: Diagnosis not present

## 2021-12-01 ENCOUNTER — Other Ambulatory Visit: Payer: Self-pay

## 2021-12-01 ENCOUNTER — Telehealth: Payer: Self-pay | Admitting: *Deleted

## 2021-12-01 DIAGNOSIS — Z87891 Personal history of nicotine dependence: Secondary | ICD-10-CM

## 2021-12-01 DIAGNOSIS — Z122 Encounter for screening for malignant neoplasm of respiratory organs: Secondary | ICD-10-CM

## 2021-12-01 NOTE — Telephone Encounter (Signed)
Left detailed message for pt to call back to discuss lung screening CT results.  

## 2021-12-01 NOTE — Telephone Encounter (Signed)
Spoke with patient regarding LDCT results.  No suspicious findings for lung cancer.  Will plan for repeat in one year.  Noted was emphysema, as previous. New finding for thyroid nodule and recommendation for thyroid ultrasound.  Patient states she had the right side of her thyroid removed due to a nodule.  Has been followed by PCP with labwork which has been normal.  Patient recently has had recent, new heart palpitations with some related shortness of breath which she felt may be thyroid related. Patient reports her listed PCP has left (Flinchum FNP) the practice and she is currently trying to establish with a new PCP.  She has made contact but not secured an appointment as of yet.  Patient requests a mailed copy of LDCT results - placed in mail today with address verified.  Patient acknowledged understanding of need to follow up on thyroid nodule and states she will be working on a new PCP appointment right away.  Patient had no further questions and was very appreciative for the call.  Annual CT order placed.   ?

## 2021-12-21 ENCOUNTER — Other Ambulatory Visit (HOSPITAL_COMMUNITY): Payer: Self-pay | Admitting: Internal Medicine

## 2021-12-21 ENCOUNTER — Other Ambulatory Visit: Payer: Self-pay | Admitting: Internal Medicine

## 2021-12-21 DIAGNOSIS — E041 Nontoxic single thyroid nodule: Secondary | ICD-10-CM

## 2021-12-26 ENCOUNTER — Encounter: Payer: Self-pay | Admitting: Cardiology

## 2021-12-26 ENCOUNTER — Ambulatory Visit: Payer: 59 | Admitting: Cardiology

## 2021-12-26 ENCOUNTER — Ambulatory Visit (HOSPITAL_COMMUNITY): Payer: 59

## 2021-12-26 VITALS — BP 125/66 | HR 63 | Temp 97.6°F | Resp 16 | Ht 66.0 in | Wt 178.2 lb

## 2021-12-26 DIAGNOSIS — Z87891 Personal history of nicotine dependence: Secondary | ICD-10-CM

## 2021-12-26 DIAGNOSIS — R0602 Shortness of breath: Secondary | ICD-10-CM

## 2021-12-26 DIAGNOSIS — R002 Palpitations: Secondary | ICD-10-CM

## 2021-12-26 DIAGNOSIS — J449 Chronic obstructive pulmonary disease, unspecified: Secondary | ICD-10-CM

## 2021-12-26 NOTE — Progress Notes (Signed)
? ?ID:  Megan Harper, DOB 1961/03/24, MRN [id number] ? ?PCP:  Doreen Beam, FNP  ?Cardiologist:  Rex Kras, DO, [facility] (established care Dec 26, 2021) ?Former Cardiology Providers: Dr. Adrian Prows ? ?REASON FOR CONSULT: Palpitations ? ?REQUESTING PHYSICIAN:  ?Flinchum, Kelby Aline, FNP ? ? ?Chief Complaint  ?Patient presents with  ? Palpitations  ? New Patient (Initial Visit)  ? ? ?HPI  ?Megan Harper is a 61 y.o. Caucasian female whose past medical history and cardiovascular risk factors include: Hx of smoking (78 pack hx, quit 2011), COPD, aortic atherosclerosis. ? ?She is referred to the office at the request of Flinchum, Kelby Aline, F* for evaluation of Palpitations. ? ?Palpitations: ?Present for the last 1 year at least, intermittent, better over the last couple weeks since she made this appointment.  No prodromal symptoms.  No improving or worsening factors, self-limited, lasting for a few seconds, no near-syncope or syncopal events. ? ?Shortness of breath: ?More pronounced with effort related activities.  Has been present for the last several years.  In the past has undergone cardiopulmonary stress test.  Patient has been attributing it to COPD and is not on home O2 or inhalers.  Has followed up with pulmonary medicine back in February/March 2022.  Denies orthopnea, paroxysmal nocturnal dyspnea or lower extremity swelling. ? ?FUNCTIONAL STATUS: ?No structured exercise program or daily routine.  ? ?ALLERGIES: ?Allergies  ?Allergen Reactions  ? Ceclor [Cefaclor] Other (See Comments)  ?  Flu like symptoms  ? Pantoprazole Itching  ? ? ?MEDICATION LIST PRIOR TO VISIT: ?Current Meds  ?Medication Sig  ? albuterol (VENTOLIN HFA) 108 (90 Base) MCG/ACT inhaler Inhale 2 puffs into the lungs every 6 (six) hours as needed for wheezing or shortness of breath.  ? fluticasone (FLONASE) 50 MCG/ACT nasal spray Place 2 sprays into both nostrils daily. (Patient taking differently: Place 2 sprays into both nostrils as  needed.)  ? omeprazole (PRILOSEC) 20 MG capsule Take 1 capsule (20 mg total) by mouth 2 (two) times daily before a meal.  ? triamcinolone cream (KENALOG) 0.1 % Apply 1 application. topically 2 (two) times daily as needed.  ?  ? ?PAST MEDICAL HISTORY: ?Past Medical History:  ?Diagnosis Date  ? Abnormal SPEP   ? Anxiety   ? Arthralgia   ? Cervical dysplasia   ? COPD (chronic obstructive pulmonary disease) ([place])   ? Emphysema of lung ([place])   ? Endometriosis   ? Fibrocystic breast disease   ? GERD (gastroesophageal reflux disease)   ? Osteoporosis   ? Thyroid nodule   ? Vitamin D insufficiency   ? ? ?PAST SURGICAL HISTORY: ?Past Surgical History:  ?Procedure Laterality Date  ? laproscopy for endometriosis    ? THYROID LOBECTOMY Right 03/08/2010  ? TUBAL LIGATION  01/23/1986  ? ? ?FAMILY HISTORY: ?The patient family history includes Colon cancer in her maternal grandfather; Diabetes in her paternal grandmother; Esophageal cancer in her maternal aunt; Heart disease in her maternal grandmother, maternal uncle, and paternal grandmother; Hypertension in her maternal grandfather and maternal grandmother; Kidney cancer in her maternal grandfather and maternal uncle; Lung cancer in her father; Lymphoma in her maternal aunt; Prostate cancer in her maternal grandfather and maternal uncle; Throat cancer in her maternal grandmother; Thyroid disease in her maternal grandmother; Uterine cancer in her mother. ? ?SOCIAL HISTORY:  ?The patient  reports that she quit smoking about 12 years ago. Her smoking use included cigarettes. She has a 77.50 pack-year smoking history. She has never  used smokeless tobacco. She reports that she does not drink alcohol and does not use drugs. ? ?REVIEW OF SYSTEMS: ?Review of Systems  ?Cardiovascular:  Positive for dyspnea on exertion and palpitations. Negative for chest pain, claudication, leg swelling, orthopnea, paroxysmal nocturnal dyspnea and syncope.  ?Respiratory:  Positive for shortness of breath.    ? ?PHYSICAL EXAM: ? ?  12/26/2021  ? 12:50 PM 11/14/2021  ?  2:39 PM 11/13/2021  ? 11:12 AM  ?Vitals with BMI  ?Height '5\' 6"'$  '5\' 6"'$  5' 6.75"  ?Weight 178 lbs 3 oz 180 lbs 180 lbs 13 oz  ?BMI 28.78 29.07 28.54  ?Systolic 573 220 254  ?Diastolic 66 70 71  ?Pulse 63 67 69  ? ? ?CONSTITUTIONAL: Well-developed and well-nourished. No acute distress.  ?SKIN: Skin is warm and dry. No rash noted. No cyanosis. No pallor. No jaundice ?HEAD: Normocephalic and atraumatic.  ?EYES: No scleral icterus ?MOUTH/THROAT: Moist oral membranes.  ?NECK: No JVD present. No thyromegaly noted. No carotid bruits  ?LYMPHATIC: No visible cervical adenopathy.  ?CHEST Normal respiratory effort. No intercostal retractions  ?LUNGS: Clear to auscultation bilaterally.  No stridor. No wheezes. No rales.  ?CARDIOVASCULAR: Regular rate and rhythm, positive S1-S2, no murmurs rubs or gallops appreciated. ?ABDOMINAL: Soft, nontender, nondistended, positive bowel sounds in all 4 quadrants, no apparent ascites.  ?EXTREMITIES: No peripheral edema, warm to touch, 2+ bilateral DP and PT pulses, venous changes suggestive of varicosities ?HEMATOLOGIC: No significant bruising ?NEUROLOGIC: Oriented to person, place, and time. Nonfocal. Normal muscle tone.  ?PSYCHIATRIC: Normal mood and affect. Normal behavior. Cooperative ? ?CARDIAC DATABASE: ?EKG: ?12/26/2021: NSR, 64 bpm, left axis, left anterior fascicular block, without underlying ischemia or injury pattern. ? ?Echocardiogram: ?Dec 27, 2015: LVEF 61%, trace MR, mild TR, no pulmonary hypertension ?  ? ?Stress Testing: ?No results found for this or any previous visit from the past 1095 days. ? ? ?Heart Catheterization: ?None ? ?Cardiopulmonary exercise test: ?01/02/2016: ?Exercise testing with gas exchange demonstrates normal functional capacity when compared to matched sedentary Norms.There is no cardiac limitation noted.  At peak exercise, patient appears limited due to her body habitus with related restrictive lung  physiology and mild obstruction. Would recommend continued follow-up with Pulmonary as well as weight loss.  ? ?LABORATORY DATA: ? ?  Latest Ref Rng & Units 11/13/2021  ? 11:55 AM 07/12/2021  ?  2:36 PM 12/14/2020  ? 11:21 AM  ?CBC  ?WBC 4.0 - 10.5 K/uL 6.1   6.9   6.7    ?Hemoglobin 12.0 - 15.0 g/dL 13.5   14.0   14.2    ?Hematocrit 36.0 - 46.0 % 40.2   41.7   41.8    ?Platelets 150.0 - 400.0 K/uL 280.0   295   300    ? ? ? ?  Latest Ref Rng & Units 11/13/2021  ? 11:55 AM 09/14/2021  ? 10:59 AM 07/12/2021  ?  2:36 PM  ?CMP  ?Glucose 70 - 99 mg/dL 87    78    ?BUN 6 - 23 mg/dL 14    8    ?Creatinine 0.40 - 1.20 mg/dL 0.66    0.82    ?Sodium 135 - 145 mEq/L 140    141    ?Potassium 3.5 - 5.1 mEq/L 3.9    3.9    ?Chloride 96 - 112 mEq/L 101    101    ?CO2 19 - 32 mEq/L 32    27    ?Calcium 8.4 -  10.5 mg/dL 9.2    9.4    ?Total Protein 6.0 - 8.3 g/dL 7.2   6.9   6.7    ?Total Bilirubin 0.2 - 1.2 mg/dL 0.6    0.4    ?Alkaline Phos 39 - 117 U/L 80    93    ?AST 0 - 37 U/L 20    17    ?ALT 0 - 35 U/L 23    16    ? ? ?Lipid Panel  ?Lab Results  ?Component Value Date  ? CHOL 185 11/13/2021  ? HDL 56.70 11/13/2021  ? LDLCALC 116 (H) 11/13/2021  ? TRIG 63.0 11/13/2021  ? CHOLHDL 3 11/13/2021  ? ?No components found for: NTPROBNP ?No results for input(s): PROBNP in the last 8760 hours. ?Recent Labs  ?  03/15/21 ?1548 07/12/21 ?1436 11/13/21 ?1155  ?TSH 1.20 3.530 2.40  ? ? ?BMP ?Recent Labs  ?  07/12/21 ?1436 11/13/21 ?1155  ?NA 141 140  ?K 3.9 3.9  ?CL 101 101  ?CO2 27 32  ?GLUCOSE 78 87  ?BUN 8 14  ?CREATININE 0.82 0.66  ?CALCIUM 9.4 9.2  ? ? ?HEMOGLOBIN A1C ?Lab Results  ?Component Value Date  ? HGBA1C 5.5 11/13/2021  ? ? ?IMPRESSION: ? ?  ICD-10-CM   ?1. Palpitations  R00.2 EKG 12-Lead  ?  LONG TERM MONITOR (3-14 DAYS)  ?  ?2. Shortness of breath  R06.02 PCV ECHOCARDIOGRAM COMPLETE  ?  PCV CARDIAC STRESS TEST  ?  ?3. Chronic obstructive pulmonary disease, unspecified COPD type ([place])  J44.9   ?  ?4. Former smoker  Z87.891   ?   ?  ? ?RECOMMENDATIONS: ?Megan Harper is a 61 y.o. Caucasian female whose past medical history and cardiac risk factors include: Hx of smoking (78 pack hx, quit 2011), COPD, aortic atherosclerosis. ? ?Pa

## 2021-12-27 ENCOUNTER — Ambulatory Visit: Payer: 59

## 2021-12-27 DIAGNOSIS — R0602 Shortness of breath: Secondary | ICD-10-CM

## 2021-12-29 ENCOUNTER — Ambulatory Visit (HOSPITAL_COMMUNITY)
Admission: RE | Admit: 2021-12-29 | Discharge: 2021-12-29 | Disposition: A | Discharge status: Home / Self Care | Payer: 59 | Source: Ambulatory Visit | Attending: Internal Medicine | Admitting: Internal Medicine

## 2021-12-29 DIAGNOSIS — E041 Nontoxic single thyroid nodule: Secondary | ICD-10-CM | POA: Insufficient documentation

## 2022-01-17 ENCOUNTER — Ambulatory Visit (INDEPENDENT_AMBULATORY_CARE_PROVIDER_SITE_OTHER): Payer: 59 | Admitting: Family Medicine

## 2022-01-17 ENCOUNTER — Encounter: Payer: Self-pay | Admitting: Family Medicine

## 2022-01-17 ENCOUNTER — Other Ambulatory Visit (HOSPITAL_COMMUNITY)
Admission: RE | Admit: 2022-01-17 | Discharge: 2022-01-17 | Disposition: A | Discharge status: Home / Self Care | Payer: 59 | Source: Ambulatory Visit | Attending: Family Medicine | Admitting: Family Medicine

## 2022-01-17 VITALS — BP 116/72 | HR 71 | Ht 66.5 in | Wt 177.0 lb

## 2022-01-17 DIAGNOSIS — Z01419 Encounter for gynecological examination (general) (routine) without abnormal findings: Secondary | ICD-10-CM | POA: Insufficient documentation

## 2022-01-17 DIAGNOSIS — Z124 Encounter for screening for malignant neoplasm of cervix: Secondary | ICD-10-CM | POA: Diagnosis not present

## 2022-01-17 NOTE — Progress Notes (Signed)
Subjective:     Megan Harper is a 61 y.o. female and is here for a comprehensive physical exam. The patient reports no problems. Had a pap with ASCUS with Negative HPV in 2021. Wants another pap smear.   The following portions of the patient's history were reviewed and updated as appropriate: allergies, current medications, past family history, past medical history, past social history, past surgical history, and problem list.  Review of Systems Pertinent items noted in HPI and remainder of comprehensive ROS otherwise negative.   Objective:    BP 116/72 (BP Location: Left Arm, Patient Position: Sitting, Cuff Size: Normal)   Pulse 71   Ht 5' 6.5" (1.689 m)   Wt 177 lb (80.3 kg)   BMI 28.14 kg/m  General appearance: alert, cooperative, and appears stated age Head: Normocephalic, without obvious abnormality, atraumatic Neck: no adenopathy, supple, symmetrical, trachea midline, and thyroid not enlarged, symmetric, no tenderness/mass/nodules Lungs: clear to auscultation bilaterally Breasts: normal appearance, no masses or tenderness Heart: regular rate and rhythm, S1, S2 normal, no murmur, click, rub or gallop Abdomen: soft, non-tender; bowel sounds normal; no masses,  no organomegaly Pelvic: cervix normal in appearance, external genitalia normal, no adnexal masses or tenderness, no cervical motion tenderness, uterus normal size, shape, and consistency, and vagina normal without discharge Extremities: extremities normal, atraumatic, no cyanosis or edema Pulses: 2+ and symmetric Skin: Skin color, texture, turgor normal. No rashes or lesions Lymph nodes: Cervical, supraclavicular, and axillary nodes normal. Neurologic: Grossly normal    Assessment:    GYN  female exam.      Plan:  Screening for malignant neoplasm of cervix  Encounter for gynecological examination without abnormal finding - mammogram ordered--will schedule. - Plan: Cytology - PAP  Return in 1 year (on  01/18/2023).     See After Visit Summary for Counseling Recommendations

## 2022-01-23 LAB — CYTOLOGY - PAP
Adequacy: ABSENT
Comment: NEGATIVE
Diagnosis: UNDETERMINED — AB
High risk HPV: NEGATIVE

## 2022-02-02 ENCOUNTER — Ambulatory Visit: Payer: 59

## 2022-02-02 DIAGNOSIS — R0602 Shortness of breath: Secondary | ICD-10-CM

## 2022-02-09 ENCOUNTER — Encounter: Payer: Self-pay | Admitting: Cardiology

## 2022-02-09 ENCOUNTER — Ambulatory Visit: Payer: 59 | Admitting: Cardiology

## 2022-02-09 VITALS — BP 126/75 | HR 79 | Temp 97.2°F | Resp 16 | Ht 66.0 in | Wt 175.0 lb

## 2022-02-09 DIAGNOSIS — Z712 Person consulting for explanation of examination or test findings: Secondary | ICD-10-CM

## 2022-02-09 DIAGNOSIS — Z87891 Personal history of nicotine dependence: Secondary | ICD-10-CM

## 2022-02-09 DIAGNOSIS — J449 Chronic obstructive pulmonary disease, unspecified: Secondary | ICD-10-CM

## 2022-02-09 DIAGNOSIS — R0602 Shortness of breath: Secondary | ICD-10-CM

## 2022-02-09 DIAGNOSIS — R002 Palpitations: Secondary | ICD-10-CM

## 2022-02-09 NOTE — Progress Notes (Signed)
ID:  Megan Harper, DOB 1960-11-18, MRN [id number]  PCP:  Megan Jewel, MD  Cardiologist:  Megan Kras, DO, [facility] (established care Dec 26, 2021) Former Cardiology Providers: Dr. Adrian Harper  Date: 02/09/22 Last Office Visit: 12/26/2021  Chief Complaint  Patient presents with   Follow-up    Palpitation and dyspnea evaluation    HPI  Megan Harper is a 61 y.o. Caucasian female whose past medical history and cardiovascular risk factors include: Hx of smoking (78 pack hx, quit 2011), COPD, aortic atherosclerosis (07/26/2017).  Patient was referred to the practice for evaluation of palpitations; however, by the time she came to the appointment her symptoms have essentially resolved.  I did order a extended Holter monitor in case if the symptoms were to resurface we will proceed with a Zio patch.  However since last office visit patient states that the symptoms have essentially resolved.  With regards to shortness of breath and dyspnea on exertion recommended echo and exercise treadmill stress test for risk stratification.  Results reviewed with her in details and noted below for further reference.  Patient states that she did establish care with pulmonary medicine and has an appointment for PFT this coming Tuesday.  However currently she has been experiencing increased allergies and sinusitis and plans to reschedule.  I suspect that her underlying shortness of breath is multifactorial but greatly stemming from her extensive history of smoking and COPD.  She is currently not on inhalers.  FUNCTIONAL STATUS: No structured exercise program or daily routine.   ALLERGIES: Allergies  Allergen Reactions   Ceclor [Cefaclor] Other (See Comments)    Flu like symptoms   Pantoprazole Itching    MEDICATION LIST PRIOR TO VISIT: Current Meds  Medication Sig   albuterol (VENTOLIN HFA) 108 (90 Base) MCG/ACT inhaler Inhale 2 puffs into the lungs every 6 (six) hours as needed for wheezing or  shortness of breath.   fluticasone (FLONASE) 50 MCG/ACT nasal spray Place 2 sprays into both nostrils daily. (Patient taking differently: Place 2 sprays into both nostrils as needed.)   meloxicam (MOBIC) 7.5 MG tablet Take 1 tablet (7.5 mg total) by mouth daily. Hold until cleared by gastroenterology.   omeprazole (PRILOSEC) 20 MG capsule Take 1 capsule (20 mg total) by mouth 2 (two) times daily before a meal.   triamcinolone cream (KENALOG) 0.1 % Apply 1 application. topically 2 (two) times daily as needed.     PAST MEDICAL HISTORY: Past Medical History:  Diagnosis Date   Abnormal SPEP    Anxiety    Arthralgia    Cervical dysplasia    COPD (chronic obstructive pulmonary disease) (HCC)    Emphysema of lung (HCC)    Endometriosis    Fibrocystic breast disease    GERD (gastroesophageal reflux disease)    Osteoporosis    Thyroid nodule    Vitamin D insufficiency     PAST SURGICAL HISTORY: Past Surgical History:  Procedure Laterality Date   laproscopy for endometriosis     THYROID LOBECTOMY Right 03/08/2010   TUBAL LIGATION  01/23/1986    FAMILY HISTORY: The patient family history includes Bladder Cancer in her sister; Colon cancer in her maternal grandfather; Diabetes in her paternal grandmother; Esophageal cancer in her maternal aunt; Heart disease in her maternal grandmother, maternal uncle, and paternal grandmother; Hypertension in her maternal grandfather and maternal grandmother; Kidney cancer in her maternal grandfather and maternal uncle; Lung cancer in her father; Lymphoma in her maternal aunt; Prostate cancer in  her maternal grandfather and maternal uncle; Throat cancer in her maternal grandmother; Thyroid disease in her maternal grandmother; Uterine cancer in her mother.  SOCIAL HISTORY:  The patient  reports that she quit smoking about 12 years ago. Her smoking use included cigarettes. She has a 77.50 pack-year smoking history. She has never used smokeless tobacco. She  reports that she does not drink alcohol and does not use drugs.  REVIEW OF SYSTEMS: Review of Systems  Cardiovascular:  Positive for dyspnea on exertion and palpitations. Negative for chest pain, claudication, leg swelling, orthopnea, paroxysmal nocturnal dyspnea and syncope.  Respiratory:  Positive for shortness of breath.     PHYSICAL EXAM:    02/09/2022    2:05 PM 01/17/2022    2:08 PM 12/26/2021   12:50 PM  Vitals with BMI  Height '5\' 6"'$  5' 6.5" '5\' 6"'$   Weight 175 lbs 177 lbs 178 lbs 3 oz  BMI 28.26 61.44 31.54  Systolic 008 676 195  Diastolic 75 72 66  Pulse 79 71 63    CONSTITUTIONAL: Well-developed and well-nourished. No acute distress.  SKIN: Skin is warm and dry. No rash noted. No cyanosis. No pallor. No jaundice HEAD: Normocephalic and atraumatic.  EYES: No scleral icterus MOUTH/THROAT: Moist oral membranes.  NECK: No JVD present. No thyromegaly noted. No carotid bruits  CHEST Normal respiratory effort. No intercostal retractions  LUNGS: Clear to auscultation bilaterally.  No stridor. No wheezes. No rales.  CARDIOVASCULAR: Regular rate and rhythm, positive S1-S2, no murmurs rubs or gallops appreciated. ABDOMINAL: Soft, nontender, nondistended, positive bowel sounds in all 4 quadrants, no apparent ascites.  EXTREMITIES: No peripheral edema, warm to touch, 2+ bilateral DP and PT pulses, venous changes suggestive of varicosities HEMATOLOGIC: No significant bruising NEUROLOGIC: Oriented to person, place, and time. Nonfocal. Normal muscle tone.  PSYCHIATRIC: Normal mood and affect. Normal behavior. Cooperative  CARDIAC DATABASE: EKG: 12/26/2021: NSR, 64 bpm, left axis, left anterior fascicular block, without underlying ischemia or injury pattern.  Echocardiogram: Dec 27, 2015: LVEF 61%, trace MR, mild TR, no pulmonary hypertension.  12/27/2021: Left ventricle cavity is normal in size and wall thickness. Normal global wall motion. Normal LV systolic function with EF 59%. Normal  diastolic filling pattern.  No significant valvular abnormality. Normal right atrial pressure.  No significant change compared to previous study in 2017.    Stress Testing: Exercise treadmill stress test 02/02/2022: Exercise treadmill stress test performed using Bruce protocol. Patient reached 7 METS, and 97% of age predicted maximum heart rate. Exercise capacity was low. No chest pain reported. Normal heart rate and hemodynamic response. Stress EKG revealed no ischemic changes. Low risk study.  Heart Catheterization: None  Cardiopulmonary exercise test: 01/02/2016: Exercise testing with gas exchange demonstrates normal functional capacity when compared to matched sedentary Norms.There is no cardiac limitation noted.  At peak exercise, patient appears limited due to her body habitus with related restrictive lung physiology and mild obstruction. Would recommend continued follow-up with Pulmonary as well as weight loss.   LABORATORY DATA:    Latest Ref Rng & Units 11/13/2021   11:55 AM 07/12/2021    2:36 PM 12/14/2020   11:21 AM  CBC  WBC 4.0 - 10.5 K/uL 6.1  6.9  6.7   Hemoglobin 12.0 - 15.0 g/dL 13.5  14.0  14.2   Hematocrit 36.0 - 46.0 % 40.2  41.7  41.8   Platelets 150.0 - 400.0 K/uL 280.0  295  300        Latest Ref Rng &  Units 11/13/2021   11:55 AM 09/14/2021   10:59 AM 07/12/2021    2:36 PM  CMP  Glucose 70 - 99 mg/dL 87   78   BUN 6 - 23 mg/dL 14   8   Creatinine 0.40 - 1.20 mg/dL 0.66   0.82   Sodium 135 - 145 mEq/L 140   141   Potassium 3.5 - 5.1 mEq/L 3.9   3.9   Chloride 96 - 112 mEq/L 101   101   CO2 19 - 32 mEq/L 32   27   Calcium 8.4 - 10.5 mg/dL 9.2   9.4   Total Protein 6.0 - 8.3 g/dL 7.2  6.9  6.7   Total Bilirubin 0.2 - 1.2 mg/dL 0.6   0.4   Alkaline Phos 39 - 117 U/L 80   93   AST 0 - 37 U/L 20   17   ALT 0 - 35 U/L 23   16     Lipid Panel  Lab Results  Component Value Date   CHOL 185 11/13/2021   HDL 56.70 11/13/2021   LDLCALC 116 (H) 11/13/2021    TRIG 63.0 11/13/2021   CHOLHDL 3 11/13/2021   No components found for: "NTPROBNP" No results for input(s): "PROBNP" in the last 8760 hours. Recent Labs    03/15/21 1548 07/12/21 1436 11/13/21 1155  TSH 1.20 3.530 2.40    BMP Recent Labs    07/12/21 1436 11/13/21 1155  NA 141 140  K 3.9 3.9  CL 101 101  CO2 27 32  GLUCOSE 78 87  BUN 8 14  CREATININE 0.82 0.66  CALCIUM 9.4 9.2    HEMOGLOBIN A1C Lab Results  Component Value Date   HGBA1C 5.5 11/13/2021    IMPRESSION:    ICD-10-CM   1. Palpitations  R00.2     2. Shortness of breath  R06.02     3. Chronic obstructive pulmonary disease, unspecified COPD type ([place])  J44.9     4. Former smoker  Z87.891     5. Encounter to discuss test results  Z71.2         RECOMMENDATIONS: Megan Harper is a 61 y.o. Caucasian female whose past medical history and cardiac risk factors include: Hx of smoking (78 pack hx, quit 2011), COPD, aortic atherosclerosis.  Palpitations Resolved since last office visit. Therefore, patient did not undergo 7-day Zio patch. Patient will continue to monitor symptoms. No near-syncope or syncopal events. TSH, electrolytes, hemoglobin within acceptable limits.  No identifiable reversible cause.  Shortness of breath /dyspnea on exertion Echocardiogram notes preserved LVEF, normal diastolic function, and no significant valvular heart disease. GXT was also reported to be low risk. I suspect that her underlying shortness of breath/dyspnea may be pulmonary given her extensive history of smoking and COPD. Patient has establish care with pulmonary medicine and tentatively scheduled for additional testing this coming week.  However she may have postponed it secondary to her sinus and allergies flaring up. I have encouraged her to complete the work-up with pulmonary medicine for now.  If the work-up with pulmonary medicine is unremarkable could consider either cardiopulmonary stress test versus  right heart catheterization for evaluation of dyspnea/pulmonary hypertension. Of note, the surface echocardiogram did not have any significant TR signal to comment on RVSP. Further recommendations to follow.  Chronic obstructive pulmonary disease, unspecified COPD type ([place]) Follows with pulmonary medicine.  Former smoker Educated on the importance of complete smoking cessation. Quit in 2011, approximate 78-pack-year history.  During her prior CT scans patient was noted to have findings of aortic atherosclerosis dating back to 2018.  She had a similar CT scan in 2022 which did not comment on aortic atherosclerosis.  I have educated her to increase physical activity as tolerated, consume a diet that is low in cholesterol, and to have her cholesterol rechecked.  If the cholesterol levels still remain not well controlled she could consider pharmacological therapy.  I have asked her to discuss this further with PCP.  FINAL MEDICATION LIST END OF ENCOUNTER: No orders of the defined types were placed in this encounter.   There are no discontinued medications.    Current Outpatient Medications:    albuterol (VENTOLIN HFA) 108 (90 Base) MCG/ACT inhaler, Inhale 2 puffs into the lungs every 6 (six) hours as needed for wheezing or shortness of breath., Disp: 18 g, Rfl: 3   fluticasone (FLONASE) 50 MCG/ACT nasal spray, Place 2 sprays into both nostrils daily. (Patient taking differently: Place 2 sprays into both nostrils as needed.), Disp: 18.2 mL, Rfl: 4   meloxicam (MOBIC) 7.5 MG tablet, Take 1 tablet (7.5 mg total) by mouth daily. Hold until cleared by gastroenterology., Disp: 90 tablet, Rfl: 1   omeprazole (PRILOSEC) 20 MG capsule, Take 1 capsule (20 mg total) by mouth 2 (two) times daily before a meal., Disp: 60 capsule, Rfl: 6   triamcinolone cream (KENALOG) 0.1 %, Apply 1 application. topically 2 (two) times daily as needed., Disp: 30 g, Rfl: 0  No orders of the defined types were placed in this  encounter.   There are no Patient Instructions on file for this visit.   --Continue cardiac medications as reconciled in final medication list. --Return in about 1 year (around 02/10/2023) for Follow up, Dyspnea. Or sooner if needed. --Continue follow-up with your primary care physician regarding the management of your other chronic comorbid conditions.  Patient's questions and concerns were addressed to her satisfaction. She voices understanding of the instructions provided during this encounter.   This note was created using a voice recognition software as a result there may be grammatical errors inadvertently enclosed that do not reflect the nature of this encounter. Every attempt is made to correct such errors.  Megan Harper, [place], [facility]  Pager: [phone] Office: [phone]

## 2022-02-13 ENCOUNTER — Ambulatory Visit: Payer: 59 | Admitting: Pulmonary Disease

## 2022-02-14 ENCOUNTER — Ambulatory Visit
Admission: RE | Admit: 2022-02-14 | Discharge: 2022-02-14 | Disposition: A | Discharge status: Home / Self Care | Payer: 59 | Source: Ambulatory Visit | Attending: Adult Health | Admitting: Adult Health

## 2022-02-14 DIAGNOSIS — Z1231 Encounter for screening mammogram for malignant neoplasm of breast: Secondary | ICD-10-CM | POA: Diagnosis present

## 2022-02-15 ENCOUNTER — Other Ambulatory Visit: Payer: Self-pay | Admitting: Family Medicine

## 2022-02-15 ENCOUNTER — Other Ambulatory Visit: Payer: Self-pay | Admitting: Adult Health

## 2022-02-15 DIAGNOSIS — N6489 Other specified disorders of breast: Secondary | ICD-10-CM

## 2022-02-15 DIAGNOSIS — R928 Other abnormal and inconclusive findings on diagnostic imaging of breast: Secondary | ICD-10-CM

## 2022-03-12 ENCOUNTER — Other Ambulatory Visit: Payer: 59

## 2022-03-23 ENCOUNTER — Ambulatory Visit
Admission: RE | Admit: 2022-03-23 | Discharge: 2022-03-23 | Disposition: A | Discharge status: Home / Self Care | Payer: 59 | Source: Ambulatory Visit | Attending: Family Medicine | Admitting: Family Medicine

## 2022-03-23 DIAGNOSIS — N6489 Other specified disorders of breast: Secondary | ICD-10-CM

## 2022-03-23 DIAGNOSIS — R928 Other abnormal and inconclusive findings on diagnostic imaging of breast: Secondary | ICD-10-CM | POA: Insufficient documentation

## 2022-03-28 ENCOUNTER — Ambulatory Visit: Payer: 59 | Admitting: Pulmonary Disease

## 2022-03-28 ENCOUNTER — Encounter: Payer: Self-pay | Admitting: Pulmonary Disease

## 2022-03-28 ENCOUNTER — Ambulatory Visit (INDEPENDENT_AMBULATORY_CARE_PROVIDER_SITE_OTHER): Payer: 59 | Admitting: Pulmonary Disease

## 2022-03-28 VITALS — BP 132/60 | HR 74 | Temp 97.8°F | Ht 66.0 in | Wt 170.4 lb

## 2022-03-28 DIAGNOSIS — J449 Chronic obstructive pulmonary disease, unspecified: Secondary | ICD-10-CM

## 2022-03-28 DIAGNOSIS — J4489 Other specified chronic obstructive pulmonary disease: Secondary | ICD-10-CM

## 2022-03-28 LAB — PULMONARY FUNCTION TEST
DL/VA % pred: 110 %
DL/VA: 4.58 ml/min/mmHg/L
DLCO cor % pred: 80 %
DLCO cor: 17.4 ml/min/mmHg
DLCO unc % pred: 80 %
DLCO unc: 17.4 ml/min/mmHg
FEF 25-75 Post: 0.88 L/s
FEF 25-75 Pre: 0.85 L/s
FEF2575-%Change-Post: 4 %
FEF2575-%Pred-Post: 35 %
FEF2575-%Pred-Pre: 34 %
FEV1-%Change-Post: 3 %
FEV1-%Pred-Post: 46 %
FEV1-%Pred-Pre: 44 %
FEV1-Post: 1.27 L
FEV1-Pre: 1.24 L
FEV1FVC-%Change-Post: -14 %
FEV1FVC-%Pred-Pre: 88 %
FEV6-%Change-Post: 22 %
FEV6-%Pred-Post: 61 %
FEV6-%Pred-Pre: 50 %
FEV6-Post: 2.12 L
FEV6-Pre: 1.74 L
FEV6FVC-%Change-Post: 0 %
FEV6FVC-%Pred-Post: 102 %
FEV6FVC-%Pred-Pre: 103 %
FVC-%Change-Post: 20 %
FVC-%Pred-Post: 60 %
FVC-%Pred-Pre: 50 %
FVC-Post: 2.16 L
FVC-Pre: 1.79 L
Post FEV1/FVC ratio: 59 %
Post FEV6/FVC ratio: 99 %
Pre FEV1/FVC ratio: 69 %
Pre FEV6/FVC Ratio: 100 %
RV % pred: 140 %
RV: 2.94 L
TLC % pred: 90 %
TLC: 4.87 L

## 2022-03-28 MED ORDER — BREZTRI AEROSPHERE 160-9-4.8 MCG/ACT IN AERO: 2.0000 | INHALATION_SPRAY | Freq: Two times a day (BID) | RESPIRATORY_TRACT | 5 refills | Status: DC

## 2022-03-28 MED ORDER — BREZTRI AEROSPHERE 160-9-4.8 MCG/ACT IN AERO
2.0000 | INHALATION_SPRAY | Freq: Two times a day (BID) | RESPIRATORY_TRACT | 0 refills | Status: DC
Start: 2022-03-28 — End: 2023-01-16

## 2022-03-28 NOTE — Progress Notes (Signed)
PFT done today. 

## 2022-03-28 NOTE — Addendum Note (Signed)
Addended by: Elton Sin on: 03/28/2022 03:44 PM   Modules accepted: Orders

## 2022-03-28 NOTE — Progress Notes (Signed)
ROSSLYN PASION    [id number]    May 18, 1961  Primary Care Physician:Pahwani, Michell Heinrich, MD  Referring Physician: Doreen Beam, FNP No address on file  Chief complaint: Follow-up for COPD  HPI: Mrs. Devoss is a 61 year old with past medical history of heavy smoking, hypothyroidism. Complains of dyspnea on exertion since 2010. Symptoms are worsening over the past year. She cannot climb more than a flight of stairs. Her symptoms associated with occasional cough, nonproductive in nature and wheezing. She was given Symbicort several years ago but did not use it. She just on albuterol inhaler which she does not use often. She has stuffy nose but no seasonal allergies, postnasal drip. She has significant issues with acid reflux and is on Nexium. She had a normal EGD in 2016.   She's had workup as noted below which included a cardiac pulmonary exercise test. Evaluated by her cardiologist Dr. Einar Gip with echo suggestive of elevated right pressures (see report below). She has thyroid surgery as few years ago and noted weight gain since then. She is trying to lose weight with exercise program and diet.   78-pack-year smoker.  Quit in 2011.  Interim History: She is not on any controller medication. Complains of sinus congestion, postnasal drip with increased dyspnea for the past 2 weeks.  She was given a course of Augmentin and Bactrim by primary care and prescribed prednisone but has not started the steroid yet  Outpatient Encounter Medications as of 03/28/2022  Medication Sig   albuterol (VENTOLIN HFA) 108 (90 Base) MCG/ACT inhaler Inhale 2 puffs into the lungs every 6 (six) hours as needed for wheezing or shortness of breath.   fluticasone (FLONASE) 50 MCG/ACT nasal spray Place 2 sprays into both nostrils daily. (Patient taking differently: Place 2 sprays into both nostrils as needed.)   loratadine (CLARITIN) 10 MG tablet Take 10 mg by mouth daily.   meloxicam (MOBIC) 7.5 MG tablet  Take 1 tablet (7.5 mg total) by mouth daily. Hold until cleared by gastroenterology.   omeprazole (PRILOSEC) 20 MG capsule Take 1 capsule (20 mg total) by mouth 2 (two) times daily before a meal.   triamcinolone cream (KENALOG) 0.1 % Apply 1 application. topically 2 (two) times daily as needed.   No facility-administered encounter medications on file as of 03/28/2022.   Physical Exam: Blood pressure 132/60, pulse 74, temperature 97.8 F (36.6 C), temperature source Oral, height '5\' 6"'$  (1.676 m), weight 170 lb 6.4 oz (77.3 kg), SpO2 97 %. Gen:      No acute distress HEENT:  EOMI, sclera anicteric Neck:     No masses; no thyromegaly Lungs:    Clear to auscultation bilaterally; normal respiratory effort CV:         Regular rate and rhythm; no murmurs Abd:      + bowel sounds; soft, non-tender; no palpable masses, no distension Ext:    No edema; adequate peripheral perfusion Skin:      Warm and dry; no rash Neuro: alert and oriented x 3 Psych: normal mood and affect   Data Reviewed: Imaging CT chest 05/26/2019  Stable granulomas, emphysema  CT chest 10/18/2020 Stable calcified granulomas, centrilobular emphysema.  CT chest 11/30/2021-emphysema, scattered calcified granulomas are stable I have reviewed the images personally.  PFTs: 07/19/14 FVC 2.31 (61%), FEV1 1.55 (52%), F/F 67, TLC 89%, RV/TLC 137, DLCO 39% Moderate obstructive disease with slight bronchial dilator response Severe diffusion defect which corrects partially for alveolar volume.  06/15/2018 FVC 2.28 [62%], FEV1 1.57 [55%), F/F 69, TLC 87%, RV/TLC 152%, DLCO 72% Severe obstruction with bronchodilator response, air trapping, minimal diffusion defect.  03/28/2022 FVC 2.16 [60%], FEV1 1.27 [46%], F/F 59, TLC 4.87 [90%], DLCO 17.40 [80%] Severe obstruction with bronchodilator  Cardiac CPET 01/02/16 Exercise testing with gas exchange demonstrates normal functional capacity when compared to matched sedentary Norms.There is  no cardiac limitation noted.  At peak exercise, patient appears limited due to her body habitus with related restrictive lung physiology and mild obstruction. Would recommend continued follow-up with Pulmonary as well as weight loss.    Echocardiogram 12/27/15 Normal LV cavity size and function, normal diastolic filling pattern EF 61% Trace MR, mild TR. No evidence of pulmonary hypertension. Trace PR. IVC is dilated with respiratory variation. Suggest elevated right heart pressure.   Labs: CBC 07/23/2017-WBC 8.6, eos 0.4%, absolute eosinophil count 35 Alpha-1 antitrypsin 05/26/2019-142, PI MM  Assessment:  COPD PFTs show severe obstruction and a bronchodilator response Has not been on controller medication for a while and has worsening dyspnea Start breztri inhaler  She is getting over an episode of sinusitis and mild COPD exacerbation and has received 2 rounds of antibiotics already Start prednisone taper as prescribed by her primary care  Prior CPET shows exercise limitation from obesity and deconditioning. I encouraged weight loss and exercise.  Pulmonary nodules Likely benign, suggestive of calcified granulomas Follow annual screening.  Health maintenance 05/27/2014-Pneumovax Up-to-date with flu  Plan/Recommendations: - Start breztri  CC: Flinchum, Kelby Aline, F*

## 2022-03-28 NOTE — Patient Instructions (Signed)
We will start you on an inhaler called breztri Continue prednisone as ordered by her primary care Follow-up in 6 months

## 2022-04-04 ENCOUNTER — Inpatient Hospital Stay: Payer: 59 | Attending: Internal Medicine

## 2022-04-04 DIAGNOSIS — J439 Emphysema, unspecified: Secondary | ICD-10-CM | POA: Diagnosis not present

## 2022-04-04 DIAGNOSIS — Z8051 Family history of malignant neoplasm of kidney: Secondary | ICD-10-CM | POA: Insufficient documentation

## 2022-04-04 DIAGNOSIS — K219 Gastro-esophageal reflux disease without esophagitis: Secondary | ICD-10-CM | POA: Insufficient documentation

## 2022-04-04 DIAGNOSIS — Z800 Family history of malignant neoplasm of digestive organs: Secondary | ICD-10-CM | POA: Insufficient documentation

## 2022-04-04 DIAGNOSIS — M549 Dorsalgia, unspecified: Secondary | ICD-10-CM | POA: Insufficient documentation

## 2022-04-04 DIAGNOSIS — Z881 Allergy status to other antibiotic agents status: Secondary | ICD-10-CM | POA: Diagnosis not present

## 2022-04-04 DIAGNOSIS — R002 Palpitations: Secondary | ICD-10-CM

## 2022-04-04 DIAGNOSIS — Z887 Allergy status to serum and vaccine status: Secondary | ICD-10-CM | POA: Diagnosis not present

## 2022-04-04 DIAGNOSIS — Z801 Family history of malignant neoplasm of trachea, bronchus and lung: Secondary | ICD-10-CM | POA: Diagnosis not present

## 2022-04-04 DIAGNOSIS — Z8349 Family history of other endocrine, nutritional and metabolic diseases: Secondary | ICD-10-CM | POA: Insufficient documentation

## 2022-04-04 DIAGNOSIS — Z8052 Family history of malignant neoplasm of bladder: Secondary | ICD-10-CM | POA: Insufficient documentation

## 2022-04-04 DIAGNOSIS — M1990 Unspecified osteoarthritis, unspecified site: Secondary | ICD-10-CM | POA: Insufficient documentation

## 2022-04-04 DIAGNOSIS — D472 Monoclonal gammopathy: Secondary | ICD-10-CM | POA: Insufficient documentation

## 2022-04-04 DIAGNOSIS — Z8042 Family history of malignant neoplasm of prostate: Secondary | ICD-10-CM | POA: Diagnosis not present

## 2022-04-04 DIAGNOSIS — Z833 Family history of diabetes mellitus: Secondary | ICD-10-CM | POA: Diagnosis not present

## 2022-04-04 DIAGNOSIS — Z87891 Personal history of nicotine dependence: Secondary | ICD-10-CM | POA: Insufficient documentation

## 2022-04-04 DIAGNOSIS — R200 Anesthesia of skin: Secondary | ICD-10-CM | POA: Insufficient documentation

## 2022-04-04 DIAGNOSIS — Z8049 Family history of malignant neoplasm of other genital organs: Secondary | ICD-10-CM | POA: Diagnosis not present

## 2022-04-04 DIAGNOSIS — G8929 Other chronic pain: Secondary | ICD-10-CM | POA: Insufficient documentation

## 2022-04-04 DIAGNOSIS — Z8249 Family history of ischemic heart disease and other diseases of the circulatory system: Secondary | ICD-10-CM | POA: Diagnosis not present

## 2022-04-04 DIAGNOSIS — Z79899 Other long term (current) drug therapy: Secondary | ICD-10-CM | POA: Diagnosis not present

## 2022-04-04 DIAGNOSIS — R202 Paresthesia of skin: Secondary | ICD-10-CM | POA: Insufficient documentation

## 2022-04-04 DIAGNOSIS — Z808 Family history of malignant neoplasm of other organs or systems: Secondary | ICD-10-CM | POA: Diagnosis not present

## 2022-04-04 LAB — CBC WITH DIFFERENTIAL/PLATELET
Abs Immature Granulocytes: 0.02 10*3/uL (ref 0.00–0.07)
Basophils Absolute: 0.1 10*3/uL (ref 0.0–0.1)
Basophils Relative: 2 %
Eosinophils Absolute: 0.1 10*3/uL (ref 0.0–0.5)
Eosinophils Relative: 1 %
HCT: 39.9 % (ref 36.0–46.0)
Hemoglobin: 13.5 g/dL (ref 12.0–15.0)
Immature Granulocytes: 0 %
Lymphocytes Relative: 27 %
Lymphs Abs: 1.7 10*3/uL (ref 0.7–4.0)
MCH: 32.5 pg (ref 26.0–34.0)
MCHC: 33.8 g/dL (ref 30.0–36.0)
MCV: 96.1 fL (ref 80.0–100.0)
Monocytes Absolute: 0.4 10*3/uL (ref 0.1–1.0)
Monocytes Relative: 7 %
Neutro Abs: 3.8 10*3/uL (ref 1.7–7.7)
Neutrophils Relative %: 63 %
Platelets: 274 10*3/uL (ref 150–400)
RBC: 4.15 MIL/uL (ref 3.87–5.11)
RDW: 12.1 % (ref 11.5–15.5)
WBC: 6.1 10*3/uL (ref 4.0–10.5)
nRBC: 0 % (ref 0.0–0.2)

## 2022-04-04 LAB — COMPREHENSIVE METABOLIC PANEL
ALT: 17 U/L (ref 0–44)
AST: 18 U/L (ref 15–41)
Albumin: 4 g/dL (ref 3.5–5.0)
Alkaline Phosphatase: 74 U/L (ref 38–126)
Anion gap: 7 (ref 5–15)
BUN: 11 mg/dL (ref 6–20)
CO2: 28 mmol/L (ref 22–32)
Calcium: 9 mg/dL (ref 8.9–10.3)
Chloride: 103 mmol/L (ref 98–111)
Creatinine, Ser: 0.69 mg/dL (ref 0.44–1.00)
GFR, Estimated: >60 mL/min (ref >60)
Glucose, Bld: 106 mg/dL — ABNORMAL HIGH (ref 70–99)
Potassium: 3.5 mmol/L (ref 3.5–5.1)
Sodium: 138 mmol/L (ref 135–145)
Total Bilirubin: 0.4 mg/dL (ref 0.3–1.2)
Total Protein: 7.5 g/dL (ref 6.5–8.1)

## 2022-04-05 LAB — KAPPA/LAMBDA LIGHT CHAINS
Kappa free light chain: 29.8 mg/L — ABNORMAL HIGH (ref 3.3–19.4)
Kappa, lambda light chain ratio: 1.97 — ABNORMAL HIGH (ref 0.26–1.65)
Lambda free light chains: 15.1 mg/L (ref 5.7–26.3)

## 2022-04-09 LAB — MULTIPLE MYELOMA PANEL, SERUM
Albumin SerPl Elph-Mcnc: 3.8 g/dL (ref 2.9–4.4)
Albumin/Glob SerPl: 1.2 (ref 0.7–1.7)
Alpha 1: 0.2 g/dL (ref 0.0–0.4)
Alpha2 Glob SerPl Elph-Mcnc: 0.7 g/dL (ref 0.4–1.0)
B-Globulin SerPl Elph-Mcnc: 1.1 g/dL (ref 0.7–1.3)
Gamma Glob SerPl Elph-Mcnc: 1.4 g/dL (ref 0.4–1.8)
Globulin, Total: 3.4 g/dL (ref 2.2–3.9)
IgA: 228 mg/dL (ref 87–352)
IgG (Immunoglobin G), Serum: 1396 mg/dL (ref 586–1602)
IgM (Immunoglobulin M), Srm: 107 mg/dL (ref 26–217)
M Protein SerPl Elph-Mcnc: 0.3 g/dL — ABNORMAL HIGH
Total Protein ELP: 7.2 g/dL (ref 6.0–8.5)

## 2022-04-18 ENCOUNTER — Inpatient Hospital Stay (HOSPITAL_BASED_OUTPATIENT_CLINIC_OR_DEPARTMENT_OTHER): Payer: 59 | Admitting: Internal Medicine

## 2022-04-18 ENCOUNTER — Encounter: Payer: Self-pay | Admitting: Internal Medicine

## 2022-04-18 VITALS — BP 123/63 | HR 61 | Temp 99.1°F | Resp 16 | Ht 66.0 in | Wt 170.0 lb

## 2022-04-18 DIAGNOSIS — D472 Monoclonal gammopathy: Secondary | ICD-10-CM

## 2022-04-18 NOTE — Progress Notes (Signed)
[place] NOTE  Patient Care Team: Mckinley Jewel, MD as PCP - General (Internal Medicine) Cammie Sickle, MD as Consulting Physician (Hematology and Oncology)  CHIEF COMPLAINTS/PURPOSE OF CONSULTATION: Monoclonal gammopathy  HEMATOLOGY HISTORY  # IgG kappa [0.4 gm/ld; JAN 2023- Rheumatology; [place]]- NOV 2022- cbc/cmp;[facility].   #Osteoarthritis [Dr.Deweshar; [place]]  HISTORY OF PRESENTING ILLNESS: Alone.  Ambulating independently. Megan Harper 61 y.o.  female is here for follow-up of monoclonal gammopathy.  Patient continues to have chronic joint pains not any worse. No significant fatigue.  Mild tingling and numbness in extremities.   Review of Systems  Constitutional:  Negative for chills, diaphoresis, fever, malaise/fatigue and weight loss.  HENT:  Negative for nosebleeds and sore throat.   Eyes:  Negative for double vision.  Respiratory:  Negative for cough, hemoptysis, sputum production, shortness of breath and wheezing.   Cardiovascular:  Negative for chest pain, palpitations, orthopnea and leg swelling.  Gastrointestinal:  Negative for abdominal pain, blood in stool, constipation, diarrhea, heartburn, melena, nausea and vomiting.  Genitourinary:  Negative for dysuria, frequency and urgency.  Musculoskeletal:  Positive for back pain and joint pain.  Skin: Negative.  Negative for itching and rash.  Neurological:  Negative for dizziness, tingling, focal weakness, weakness and headaches.  Endo/Heme/Allergies:  Does not bruise/bleed easily.  Psychiatric/Behavioral:  Negative for depression. The patient is not nervous/anxious and does not have insomnia.     MEDICAL HISTORY:  Past Medical History:  Diagnosis Date   Abnormal SPEP    Anxiety    Arthralgia    Cervical dysplasia    COPD (chronic obstructive pulmonary disease) (HCC)    Emphysema of lung (HCC)    Endometriosis    Fibrocystic breast disease    GERD (gastroesophageal reflux disease)     Osteoporosis    Thyroid nodule    Vitamin D insufficiency     SURGICAL HISTORY: Past Surgical History:  Procedure Laterality Date   laproscopy for endometriosis     THYROID LOBECTOMY Right 03/08/2010   TUBAL LIGATION  01/23/1986    SOCIAL HISTORY: Social History   Socioeconomic History   Marital status: Married    Spouse name: Not on file   Number of children: 3   Years of education: Not on file   Highest education level: Not on file  Occupational History   Not on file  Tobacco Use   Smoking status: Former    Packs/day: 2.50    Years: 31.00    Total pack years: 77.50    Types: Cigarettes    Quit date: 09/17/2009    Years since quitting: 12.5   Smokeless tobacco: Never  Vaping Use   Vaping Use: Never used  Substance and Sexual Activity   Alcohol use: No   Drug use: No   Sexual activity: Yes    Birth control/protection: Surgical, Post-menopausal    Comment: Tubal  Other Topics Concern   Not on file  Social History Narrative   Lives in [place]; with husband- 3 dogs. Quit smoking 2010. No alcohol. Used to work at [employer] care.    Social Determinants of Health   Financial Resource Strain: Not on file  Food Insecurity: Not on file  Transportation Needs: Not on file  Physical Activity: Not on file  Stress: Not on file  Social Connections: Not on file  Intimate Partner Violence: Not on file    FAMILY HISTORY: Family History  Problem Relation Age of Onset   Uterine cancer Mother  Lung cancer Father        smoked   Bladder Cancer Sister    Esophageal cancer Maternal Aunt    Lymphoma Maternal Aunt    Kidney cancer Maternal Uncle    Prostate cancer Maternal Uncle    Heart disease Maternal Uncle    Heart disease Maternal Grandmother    Hypertension Maternal Grandmother    Thyroid disease Maternal Grandmother    Throat cancer Maternal Grandmother    Prostate cancer Maternal Grandfather    Hypertension Maternal Grandfather    Colon cancer Maternal  Grandfather    Kidney cancer Maternal Grandfather    Heart disease Paternal Grandmother    Diabetes Paternal Grandmother    Breast cancer Neg Hx     ALLERGIES:  is allergic to ceclor [cefaclor], influenza vaccines, and pantoprazole.  MEDICATIONS:  Current Outpatient Medications  Medication Sig Dispense Refill   albuterol (VENTOLIN HFA) 108 (90 Base) MCG/ACT inhaler Inhale 2 puffs into the lungs every 6 (six) hours as needed for wheezing or shortness of breath. 18 g 3   Budeson-Glycopyrrol-Formoterol (BREZTRI AEROSPHERE) 160-9-4.8 MCG/ACT AERO Inhale 2 puffs into the lungs in the morning and at bedtime. 10.7 g 5   Budeson-Glycopyrrol-Formoterol (BREZTRI AEROSPHERE) 160-9-4.8 MCG/ACT AERO Inhale 2 puffs into the lungs in the morning and at bedtime. 5.9 g 0   fluticasone (FLONASE) 50 MCG/ACT nasal spray Place 2 sprays into both nostrils daily. (Patient taking differently: Place 2 sprays into both nostrils as needed.) 18.2 mL 4   loratadine (CLARITIN) 10 MG tablet Take 10 mg by mouth daily.     omeprazole (PRILOSEC) 20 MG capsule Take 1 capsule (20 mg total) by mouth 2 (two) times daily before a meal. 60 capsule 6   triamcinolone cream (KENALOG) 0.1 % Apply 1 application. topically 2 (two) times daily as needed. 30 g 0   meloxicam (MOBIC) 7.5 MG tablet Take 1 tablet (7.5 mg total) by mouth daily. Hold until cleared by gastroenterology. (Patient not taking: Reported on 04/18/2022) 90 tablet 1   No current facility-administered medications for this visit.      PHYSICAL EXAMINATION:   Vitals:   04/18/22 1430  BP: 123/63  Pulse: 61  Resp: 16  Temp: 99.1 F (37.3 C)  SpO2: 99%   Filed Weights   04/18/22 1430  Weight: 170 lb (77.1 kg)    Physical Exam Vitals and nursing note reviewed.  HENT:     Head: Normocephalic and atraumatic.     Mouth/Throat:     Pharynx: Oropharynx is clear.  Eyes:     Extraocular Movements: Extraocular movements intact.     Pupils: Pupils are equal,  round, and reactive to light.  Cardiovascular:     Rate and Rhythm: Normal rate and regular rhythm.  Pulmonary:     Comments: Decreased breath sounds bilaterally.  Abdominal:     Palpations: Abdomen is soft.  Musculoskeletal:        General: Normal range of motion.     Cervical back: Normal range of motion.  Skin:    General: Skin is warm.  Neurological:     General: No focal deficit present.     Mental Status: She is alert and oriented to person, place, and time.  Psychiatric:        Behavior: Behavior normal.        Judgment: Judgment normal.     LABORATORY DATA:  I have reviewed the data as listed Lab Results  Component Value Date   WBC  6.1 04/04/2022   HGB 13.5 04/04/2022   HCT 39.9 04/04/2022   MCV 96.1 04/04/2022   PLT 274 04/04/2022   Recent Labs    07/12/21 1436 09/14/21 1059 11/13/21 1155 04/04/22 1506  NA 141  --  140 138  K 3.9  --  3.9 3.5  CL 101  --  101 103  CO2 27  --  32 28  GLUCOSE 78  --  87 106*  BUN 8  --  14 11  CREATININE 0.82  --  0.66 0.69  CALCIUM 9.4  --  9.2 9.0  GFRNONAA  --   --   --  >60  PROT 6.7 6.9 7.2 7.5  ALBUMIN 4.5  --  4.4 4.0  AST 17  --  20 18  ALT 16  --  23 17  ALKPHOS 93  --  80 74  BILITOT 0.4  --  0.6 0.4     MM DIAG BREAST TOMO UNI RIGHT  Result Date: 03/23/2022 CLINICAL DATA:  61 year old female presenting as a recall from screening for possible right breast asymmetry. EXAM: DIGITAL DIAGNOSTIC UNILATERAL RIGHT MAMMOGRAM WITH TOMOSYNTHESIS TECHNIQUE: Right digital diagnostic mammography and breast tomosynthesis was performed. COMPARISON:  Previous exam(s). ACR Breast Density Category c: The breast tissue is heterogeneously dense, which may obscure small masses. FINDINGS: Spot compression tomosynthesis MLO as well as full field mL, MLO and exaggerated cc tomosynthesis views of the right breast were performed for a questioned asymmetry seen only on MLO view in the central far posterior right breast, likely newly  visualized due to more posterior tissue being pulled in on this year's screening mammogram. On the additional spot imaging the asymmetry effaces and most likely represents normal fibroglandular tissue. The asymmetry does persist on the full true lateral view. IMPRESSION: Probably benign asymmetry in the far posterior right breast, favored to represent normal fibroglandular tissue, and likely newly visualized due to more posterior tissue being pulled in on this year's screening mammogram. RECOMMENDATION: Diagnostic right breast mammogram in 6 months. I have discussed the findings and recommendations with the patient who agrees to short-term follow-up. If applicable, a reminder letter will be sent to the patient regarding the next appointment. BI-RADS CATEGORY  3: Probably benign. Electronically Signed   By: Audie Pinto M.D.   On: 03/23/2022 15:31   Lab Results  Component Value Date   KPAFRELGTCHN 29.8 (H) 04/04/2022   LAMBDASER 15.1 04/04/2022   KAPLAMBRATIO 1.97 (H) 04/04/2022     Monoclonal gammopathy of unknown significance (MGUS) # MGUS-IgG kappa 0.4 g/dL [JAN 2023]; NOV 2022-CBC chemistries normal limits. AUG 2023- 0.3 gm/dl;  K/L= 1.97- STABLE.     Discussed that in general the risk of transformation to myeloma is about 1 %/year.  Overall low risk of progression to myeloma.  Continue monitoring.  #Joint pain osteoarthritis-clinically not suggestive of multiple myeloma.  Reviewed x-rays from 2022-cervical spine/hips-no lytic lesions noted.   # DISPOSITION: # follow up in 6 months- MD; 2 week prior-labs- cbc/cmp;MM panel; k/l light chains-Dr.B      All questions were answered. The patient knows to call the clinic with any problems, questions or concerns.      Cammie Sickle, MD 04/18/2022 2:48 PM

## 2022-04-18 NOTE — Assessment & Plan Note (Signed)
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 AUG 2023- 0.3 gm/dl;  K/L= 1.97- STABLE.     Discussed that in general the risk of transformation to myeloma is about 1 %/year.  Overall low risk of progression to myeloma.  Continue monitoring.  #Joint pain osteoarthritis-clinically not suggestive of multiple myeloma.  Reviewed x-rays from 2022-cervical spine/hips-no lytic lesions noted.   # DISPOSITION: # follow up in 6 months- MD; 2 week prior-labs- cbc/cmp;MM panel; k/l light chains-Dr.B   

## 2022-10-03 ENCOUNTER — Ambulatory Visit (INDEPENDENT_AMBULATORY_CARE_PROVIDER_SITE_OTHER): Payer: Medicaid Other | Admitting: Nurse Practitioner

## 2022-10-03 ENCOUNTER — Encounter: Payer: Self-pay | Admitting: Nurse Practitioner

## 2022-10-03 ENCOUNTER — Other Ambulatory Visit (HOSPITAL_COMMUNITY)
Admission: RE | Admit: 2022-10-03 | Discharge: 2022-10-03 | Disposition: A | Discharge status: Home / Self Care | Payer: Medicaid Other | Source: Ambulatory Visit | Attending: Nurse Practitioner | Admitting: Nurse Practitioner

## 2022-10-03 VITALS — BP 124/72 | HR 64 | Temp 97.7°F | Ht 66.0 in | Wt 168.4 lb

## 2022-10-03 DIAGNOSIS — N9089 Other specified noninflammatory disorders of vulva and perineum: Secondary | ICD-10-CM | POA: Diagnosis not present

## 2022-10-03 DIAGNOSIS — N8111 Cystocele, midline: Secondary | ICD-10-CM | POA: Diagnosis not present

## 2022-10-03 DIAGNOSIS — N812 Incomplete uterovaginal prolapse: Secondary | ICD-10-CM | POA: Insufficient documentation

## 2022-10-03 DIAGNOSIS — R300 Dysuria: Secondary | ICD-10-CM | POA: Diagnosis not present

## 2022-10-03 DIAGNOSIS — N8110 Cystocele, unspecified: Secondary | ICD-10-CM | POA: Insufficient documentation

## 2022-10-03 LAB — POC URINALSYSI DIPSTICK (AUTOMATED)
Bilirubin, UA: NEGATIVE
Blood, UA: NEGATIVE
Glucose, UA: NEGATIVE
Ketones, UA: NEGATIVE
Leukocytes, UA: NEGATIVE
Nitrite, UA: NEGATIVE
Protein, UA: NEGATIVE
Spec Grav, UA: 1.01 (ref 1.010–1.025)
Urobilinogen, UA: 0.2 E.U./dL
pH, UA: 7 (ref 5.0–8.0)

## 2022-10-03 NOTE — Progress Notes (Unsigned)
Tomasita Morrow, NP-C Phone: [phone]  Megan Harper is a 62 y.o. female who presents today for UTI symptoms.   UTI: Dysuria- *** Frequency- ***  Urgency- ***  Hematuria- ***  Fever- *** Abd pain- ***  Vaginal d/c- ***   Social History   Tobacco Use  Smoking Status Former   Packs/day: 2.50   Years: 31.00   Total pack years: 77.50   Types: Cigarettes   Quit date: 09/17/2009   Years since quitting: 13.0  Smokeless Tobacco Never    Current Outpatient Medications on File Prior to Visit  Medication Sig Dispense Refill   albuterol (VENTOLIN HFA) 108 (90 Base) MCG/ACT inhaler Inhale 2 puffs into the lungs every 6 (six) hours as needed for wheezing or shortness of breath. 18 g 3   Budeson-Glycopyrrol-Formoterol (BREZTRI AEROSPHERE) 160-9-4.8 MCG/ACT AERO Inhale 2 puffs into the lungs in the morning and at bedtime. 10.7 g 5   Budeson-Glycopyrrol-Formoterol (BREZTRI AEROSPHERE) 160-9-4.8 MCG/ACT AERO Inhale 2 puffs into the lungs in the morning and at bedtime. 5.9 g 0   fluticasone (FLONASE) 50 MCG/ACT nasal spray Place 2 sprays into both nostrils daily. (Patient taking differently: Place 2 sprays into both nostrils as needed.) 18.2 mL 4   loratadine (CLARITIN) 10 MG tablet Take 10 mg by mouth daily.     meloxicam (MOBIC) 7.5 MG tablet Take 1 tablet (7.5 mg total) by mouth daily. Hold until cleared by gastroenterology. (Patient not taking: Reported on 04/18/2022) 90 tablet 1   omeprazole (PRILOSEC) 20 MG capsule Take 1 capsule (20 mg total) by mouth 2 (two) times daily before a meal. 60 capsule 6   triamcinolone cream (KENALOG) 0.1 % Apply 1 application. topically 2 (two) times daily as needed. 30 g 0   No current facility-administered medications on file prior to visit.     ROS see history of present illness  Objective  Physical Exam Vitals:   10/03/22 1602  BP: 124/72  Pulse: 64  Temp: 97.7 F (36.5 C)  SpO2: 99%    BP Readings from Last 3 Encounters:  10/03/22 124/72   04/18/22 123/63  03/28/22 132/60   Wt Readings from Last 3 Encounters:  10/03/22 168 lb 6.4 oz (76.4 kg)  04/18/22 170 lb (77.1 kg)  03/28/22 170 lb 6.4 oz (77.3 kg)    Physical Exam Exam conducted with a chaperone present Gracy Racer, CMA).  Constitutional:      General: She is not in acute distress.    Appearance: Normal appearance.  HENT:     Head: Normocephalic.  Cardiovascular:     Rate and Rhythm: Normal rate and regular rhythm.     Heart sounds: Normal heart sounds.  Pulmonary:     Effort: Pulmonary effort is normal.     Breath sounds: Normal breath sounds.  Genitourinary:    Vagina: Vaginal discharge and prolapsed vaginal walls present.     Comments: Unable to visualize cervix. Skin:    General: Skin is warm and dry.  Neurological:     General: No focal deficit present.     Mental Status: She is alert.  Psychiatric:        Mood and Affect: Mood normal.        Behavior: Behavior normal.    Assessment/Plan: Please see individual problem list.  Cystocele, midline -     Ambulatory referral to Urogynecology  Vulvar irritation -     Cervicovaginal ancillary only  Dysuria -     POCT Urinalysis Dipstick (Automated) -  Urinalysis, Routine w reflex microscopic -     Urine Culture   Return if symptoms worsen or fail to improve.   Tomasita Morrow, NP-C [place]

## 2022-10-04 LAB — URINALYSIS, ROUTINE W REFLEX MICROSCOPIC
Bilirubin Urine: NEGATIVE
Hgb urine dipstick: NEGATIVE
Ketones, ur: NEGATIVE
Leukocytes,Ua: NEGATIVE
Nitrite: NEGATIVE
RBC / HPF: NONE SEEN (ref 0–?)
Specific Gravity, Urine: 1.01 (ref 1.000–1.030)
Total Protein, Urine: NEGATIVE
Urine Glucose: NEGATIVE
Urobilinogen, UA: 0.2 (ref 0.0–1.0)
pH: 7 (ref 5.0–8.0)

## 2022-10-04 LAB — URINE CULTURE
MICRO NUMBER:: 14532541
SPECIMEN QUALITY:: ADEQUATE

## 2022-10-04 NOTE — Assessment & Plan Note (Addendum)
UA in office normal. Will send for microscopic and culture, await results.

## 2022-10-04 NOTE — Assessment & Plan Note (Signed)
Noted on exam, close to vaginal opening, stage 2. Will refer to Urogyn. Encouraged patient to seek care if worsening.

## 2022-10-04 NOTE — Assessment & Plan Note (Addendum)
No rash or erythema noted on exam. Will swab for BV and yeast, await results.

## 2022-10-05 ENCOUNTER — Other Ambulatory Visit: Payer: 59

## 2022-10-05 LAB — CERVICOVAGINAL ANCILLARY ONLY
Bacterial Vaginitis (gardnerella): NEGATIVE
Candida Glabrata: NEGATIVE
Candida Vaginitis: NEGATIVE
Chlamydia: NEGATIVE
Comment: NEGATIVE
Comment: NEGATIVE
Comment: NEGATIVE
Comment: NEGATIVE
Comment: NEGATIVE
Comment: NORMAL
Neisseria Gonorrhea: NEGATIVE
Trichomonas: NEGATIVE

## 2022-10-09 NOTE — Addendum Note (Signed)
Addended by: Tomasita Morrow on: 10/09/2022 03:10 PM   Modules accepted: Orders

## 2022-10-19 ENCOUNTER — Ambulatory Visit: Payer: 59 | Admitting: Internal Medicine

## 2022-10-22 ENCOUNTER — Telehealth: Payer: Self-pay | Admitting: Family

## 2022-10-22 ENCOUNTER — Encounter: Payer: Self-pay | Admitting: Family

## 2022-10-22 ENCOUNTER — Ambulatory Visit: Payer: Medicaid Other | Admitting: Family

## 2022-10-22 VITALS — BP 124/80 | HR 64 | Temp 97.9°F | Ht 66.5 in | Wt 171.2 lb

## 2022-10-22 DIAGNOSIS — E041 Nontoxic single thyroid nodule: Secondary | ICD-10-CM | POA: Diagnosis not present

## 2022-10-22 DIAGNOSIS — R140 Abdominal distension (gaseous): Secondary | ICD-10-CM

## 2022-10-22 DIAGNOSIS — Z136 Encounter for screening for cardiovascular disorders: Secondary | ICD-10-CM

## 2022-10-22 DIAGNOSIS — J439 Emphysema, unspecified: Secondary | ICD-10-CM

## 2022-10-22 DIAGNOSIS — Z13220 Encounter for screening for lipoid disorders: Secondary | ICD-10-CM

## 2022-10-22 DIAGNOSIS — E559 Vitamin D deficiency, unspecified: Secondary | ICD-10-CM | POA: Diagnosis not present

## 2022-10-22 DIAGNOSIS — E890 Postprocedural hypothyroidism: Secondary | ICD-10-CM

## 2022-10-22 DIAGNOSIS — J4489 Other specified chronic obstructive pulmonary disease: Secondary | ICD-10-CM | POA: Diagnosis not present

## 2022-10-22 DIAGNOSIS — D472 Monoclonal gammopathy: Secondary | ICD-10-CM

## 2022-10-22 DIAGNOSIS — E538 Deficiency of other specified B group vitamins: Secondary | ICD-10-CM

## 2022-10-22 NOTE — Patient Instructions (Addendum)
Start prilosec '20mg'$  daily  I would also recommend starting MiraLAX every other or every third day to promote bowel movement every single day. I placed a referral to pulmonology, hematology as you are now under manage Medicare plan.  You may call to schedule your follow-up appointments.    I have also ordered transvaginal and abdominal ultrasound.  Ultrasound of thyroid is due May 2024.   Nice to meet you!

## 2022-10-22 NOTE — Progress Notes (Unsigned)
   Assessment & Plan:  There are no diagnoses linked to this encounter.   Return precautions given.   Risks, benefits, and alternatives of the medications and treatment plan prescribed today were discussed, and patient expressed understanding.   Education regarding symptom management and diagnosis given to patient on AVS either electronically or printed.  No follow-ups on file.  Mable Paris, FNP  Subjective:    Patient ID: Megan Harper, female    DOB: Oct 10, 1960, 62 y.o.   MRN: [id number]  CC: Megan Harper is a 62 y.o. female who presents today to establish care.    HPI: Complains of abdominal bloating for years after she eats.  She is taking omeprazole 51m only prn.  No epigastric burning, excessive gas  She endorses chronic constant patient.  Previously had been on MiraLAX daily which caused diarrhea  She complains of RUQ pressure, not related to eating.  pelvic pressure and She complains of sinus congestion and ear congestion     Colonoscopy is up to date.     She was seen 10/03/22 for cystocele for vulvar irritation.   She is following with Dr BYevette Edwardsfor MGUS   She is seeing urology next month.   Right thyroid removed for follicular nodule which per patient could increase risk of malignancy. She has seen Dr KDwyane Deein the past.   Allergies: Ceclor [cefaclor], Influenza vaccines, and Pantoprazole Current Outpatient Medications on File Prior to Visit  Medication Sig Dispense Refill   albuterol (VENTOLIN HFA) 108 (90 Base) MCG/ACT inhaler Inhale 2 puffs into the lungs every 6 (six) hours as needed for wheezing or shortness of breath. 18 g 3   Budeson-Glycopyrrol-Formoterol (BREZTRI AEROSPHERE) 160-9-4.8 MCG/ACT AERO Inhale 2 puffs into the lungs in the morning and at bedtime. 10.7 g 5   Budeson-Glycopyrrol-Formoterol (BREZTRI AEROSPHERE) 160-9-4.8 MCG/ACT AERO Inhale 2 puffs into the lungs in the morning and at bedtime. 5.9 g 0   fluticasone (FLONASE) 50  MCG/ACT nasal spray Place 2 sprays into both nostrils daily. (Patient taking differently: Place 2 sprays into both nostrils as needed.) 18.2 mL 4   loratadine (CLARITIN) 10 MG tablet Take 10 mg by mouth daily.     meloxicam (MOBIC) 7.5 MG tablet Take 1 tablet (7.5 mg total) by mouth daily. Hold until cleared by gastroenterology. 90 tablet 1   omeprazole (PRILOSEC) 20 MG capsule Take 1 capsule (20 mg total) by mouth 2 (two) times daily before a meal. 60 capsule 6   triamcinolone cream (KENALOG) 0.1 % Apply 1 application. topically 2 (two) times daily as needed. (Patient not taking: Reported on 10/22/2022) 30 g 0   No current facility-administered medications on file prior to visit.    Review of Systems    Objective:    BP 124/80   Pulse 64   Temp 97.9 F (36.6 C) (Oral)   Ht 5' 6.5" (1.689 m)   Wt 171 lb 3.2 oz (77.7 kg)   SpO2 97%   BMI 27.22 kg/m  BP Readings from Last 3 Encounters:  10/22/22 124/80  10/03/22 124/72  04/18/22 123/63   Wt Readings from Last 3 Encounters:  10/22/22 171 lb 3.2 oz (77.7 kg)  10/03/22 168 lb 6.4 oz (76.4 kg)  04/18/22 170 lb (77.1 kg)    Physical Exam

## 2022-10-22 NOTE — Telephone Encounter (Signed)
Lft pt vm to call ofc to sch US's. thanks 

## 2022-10-22 NOTE — Assessment & Plan Note (Signed)
Narrative & Impression  CLINICAL DATA:  Remote right thyroidectomy   EXAM: THYROID ULTRASOUND   TECHNIQUE: Ultrasound examination of the thyroid gland and adjacent soft tissues was performed.   COMPARISON:  11/21/2009   FINDINGS: Parenchymal Echotexture: Moderately heterogenous   Isthmus: 3 mm   Right lobe: Surgically absent   Left lobe: 3.9 x 1.8 x 2.0 cm   _________________________________________________________   Estimated total number of nodules >/= 1 cm: 1   Number of spongiform nodules >/=  2 cm not described below (TR1): 0   Number of mixed cystic and solid nodules >/= 1.5 cm not described below (TR2): 0   _________________________________________________________   Nodule # 2:   Location: Left; Mid   Maximum size: 1.3 cm; Other 2 dimensions: 0.4 x 1.0 cm   Composition: solid/almost completely solid (2)   Echogenicity: hypoechoic (2)   Shape: not taller-than-wide (0)   Margins: ill-defined (0)   Echogenic foci: none (0) [place] 12/2021 IMPRESSION: 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   Remote right thyroidectomy.     Due repeat US thyroif 12/2022. Ordered.

## 2022-10-23 NOTE — Assessment & Plan Note (Signed)
She is no longer on thyroid replacement hormone.  Pending TSH, repeat thyroid ultrasound 12/2022.

## 2022-10-23 NOTE — Assessment & Plan Note (Signed)
Differential differential includes GERD, celiac disease, constipation.  Pending abdominal ultrasound to ensure cholelithiasis is not playing a role in right upper quadrant discomfort although suspect this could be related to constipation or GERD.  Also pending transvaginal ultrasound to evaluate for ovarian pathology.  She has upcoming urology appointment.  Advised to start omeprazole 20 mg daily versus as needed.  Advised to start MiraLAX every third day to regulate bowels.  Will follow.

## 2022-10-25 ENCOUNTER — Inpatient Hospital Stay: Payer: Medicaid Other | Attending: Internal Medicine

## 2022-10-25 DIAGNOSIS — D472 Monoclonal gammopathy: Secondary | ICD-10-CM | POA: Diagnosis not present

## 2022-10-25 DIAGNOSIS — Z79899 Other long term (current) drug therapy: Secondary | ICD-10-CM | POA: Insufficient documentation

## 2022-10-25 LAB — CBC WITH DIFFERENTIAL/PLATELET
Abs Immature Granulocytes: 0.01 10*3/uL (ref 0.00–0.07)
Basophils Absolute: 0.1 10*3/uL (ref 0.0–0.1)
Basophils Relative: 1 %
Eosinophils Absolute: 0.1 10*3/uL (ref 0.0–0.5)
Eosinophils Relative: 1 %
HCT: 42.9 % (ref 36.0–46.0)
Hemoglobin: 14.7 g/dL (ref 12.0–15.0)
Immature Granulocytes: 0 %
Lymphocytes Relative: 25 %
Lymphs Abs: 1.5 10*3/uL (ref 0.7–4.0)
MCH: 32.3 pg (ref 26.0–34.0)
MCHC: 34.3 g/dL (ref 30.0–36.0)
MCV: 94.3 fL (ref 80.0–100.0)
Monocytes Absolute: 0.3 10*3/uL (ref 0.1–1.0)
Monocytes Relative: 5 %
Neutro Abs: 4.1 10*3/uL (ref 1.7–7.7)
Neutrophils Relative %: 68 %
Platelets: 285 10*3/uL (ref 150–400)
RBC: 4.55 MIL/uL (ref 3.87–5.11)
RDW: 11.9 % (ref 11.5–15.5)
WBC: 6.1 10*3/uL (ref 4.0–10.5)
nRBC: 0 % (ref 0.0–0.2)

## 2022-10-25 LAB — COMPREHENSIVE METABOLIC PANEL
ALT: 20 U/L (ref 0–44)
AST: 21 U/L (ref 15–41)
Albumin: 4.3 g/dL (ref 3.5–5.0)
Alkaline Phosphatase: 79 U/L (ref 38–126)
Anion gap: 9 (ref 5–15)
BUN: 10 mg/dL (ref 6–20)
CO2: 27 mmol/L (ref 22–32)
Calcium: 8.9 mg/dL (ref 8.9–10.3)
Chloride: 102 mmol/L (ref 98–111)
Creatinine, Ser: 0.64 mg/dL (ref 0.44–1.00)
GFR, Estimated: >60 mL/min (ref >60)
Glucose, Bld: 96 mg/dL (ref 70–99)
Potassium: 3.6 mmol/L (ref 3.5–5.1)
Sodium: 138 mmol/L (ref 135–145)
Total Bilirubin: 0.7 mg/dL (ref 0.3–1.2)
Total Protein: 7.9 g/dL (ref 6.5–8.1)

## 2022-10-26 ENCOUNTER — Ambulatory Visit: Payer: Medicaid Other | Admitting: Urology

## 2022-10-26 LAB — KAPPA/LAMBDA LIGHT CHAINS
Kappa free light chain: 28.5 mg/L — ABNORMAL HIGH (ref 3.3–19.4)
Kappa, lambda light chain ratio: 2.02 — ABNORMAL HIGH (ref 0.26–1.65)
Lambda free light chains: 14.1 mg/L (ref 5.7–26.3)

## 2022-10-29 ENCOUNTER — Ambulatory Visit
Admission: RE | Admit: 2022-10-29 | Discharge: 2022-10-29 | Disposition: A | Discharge status: Home / Self Care | Payer: Medicaid Other | Source: Ambulatory Visit | Attending: Family | Admitting: Family

## 2022-10-29 DIAGNOSIS — D252 Subserosal leiomyoma of uterus: Secondary | ICD-10-CM | POA: Diagnosis not present

## 2022-10-29 DIAGNOSIS — R1011 Right upper quadrant pain: Secondary | ICD-10-CM | POA: Diagnosis not present

## 2022-10-29 DIAGNOSIS — R140 Abdominal distension (gaseous): Secondary | ICD-10-CM | POA: Insufficient documentation

## 2022-10-30 ENCOUNTER — Other Ambulatory Visit: Payer: Self-pay | Admitting: Nurse Practitioner

## 2022-10-30 DIAGNOSIS — D252 Subserosal leiomyoma of uterus: Secondary | ICD-10-CM

## 2022-10-30 DIAGNOSIS — N9089 Other specified noninflammatory disorders of vulva and perineum: Secondary | ICD-10-CM

## 2022-10-31 LAB — MULTIPLE MYELOMA PANEL, SERUM
Albumin SerPl Elph-Mcnc: 4 g/dL (ref 2.9–4.4)
Albumin/Glob SerPl: 1.3 (ref 0.7–1.7)
Alpha 1: 0.2 g/dL (ref 0.0–0.4)
Alpha2 Glob SerPl Elph-Mcnc: 0.7 g/dL (ref 0.4–1.0)
B-Globulin SerPl Elph-Mcnc: 1 g/dL (ref 0.7–1.3)
Gamma Glob SerPl Elph-Mcnc: 1.3 g/dL (ref 0.4–1.8)
Globulin, Total: 3.2 g/dL (ref 2.2–3.9)
IgA: 226 mg/dL (ref 87–352)
IgG (Immunoglobin G), Serum: 1335 mg/dL (ref 586–1602)
IgM (Immunoglobulin M), Srm: 91 mg/dL (ref 26–217)
M Protein SerPl Elph-Mcnc: 0.2 g/dL — ABNORMAL HIGH
Total Protein ELP: 7.2 g/dL (ref 6.0–8.5)

## 2022-11-03 ENCOUNTER — Other Ambulatory Visit: Payer: Self-pay | Admitting: Acute Care

## 2022-11-03 DIAGNOSIS — Z87891 Personal history of nicotine dependence: Secondary | ICD-10-CM

## 2022-11-03 DIAGNOSIS — Z122 Encounter for screening for malignant neoplasm of respiratory organs: Secondary | ICD-10-CM

## 2022-11-09 ENCOUNTER — Encounter: Payer: Self-pay | Admitting: Internal Medicine

## 2022-11-09 ENCOUNTER — Inpatient Hospital Stay: Payer: Medicaid Other | Attending: Internal Medicine | Admitting: Internal Medicine

## 2022-11-09 VITALS — BP 100/66 | HR 60 | Temp 96.8°F | Resp 18 | Wt 172.9 lb

## 2022-11-09 DIAGNOSIS — Z800 Family history of malignant neoplasm of digestive organs: Secondary | ICD-10-CM | POA: Insufficient documentation

## 2022-11-09 DIAGNOSIS — Z807 Family history of other malignant neoplasms of lymphoid, hematopoietic and related tissues: Secondary | ICD-10-CM | POA: Diagnosis not present

## 2022-11-09 DIAGNOSIS — Z79899 Other long term (current) drug therapy: Secondary | ICD-10-CM | POA: Insufficient documentation

## 2022-11-09 DIAGNOSIS — R200 Anesthesia of skin: Secondary | ICD-10-CM | POA: Diagnosis not present

## 2022-11-09 DIAGNOSIS — D808 Other immunodeficiencies with predominantly antibody defects: Secondary | ICD-10-CM | POA: Insufficient documentation

## 2022-11-09 DIAGNOSIS — M1990 Unspecified osteoarthritis, unspecified site: Secondary | ICD-10-CM | POA: Insufficient documentation

## 2022-11-09 DIAGNOSIS — D252 Subserosal leiomyoma of uterus: Secondary | ICD-10-CM | POA: Insufficient documentation

## 2022-11-09 DIAGNOSIS — Z8049 Family history of malignant neoplasm of other genital organs: Secondary | ICD-10-CM | POA: Diagnosis not present

## 2022-11-09 DIAGNOSIS — R202 Paresthesia of skin: Secondary | ICD-10-CM | POA: Diagnosis not present

## 2022-11-09 DIAGNOSIS — G8929 Other chronic pain: Secondary | ICD-10-CM | POA: Diagnosis not present

## 2022-11-09 DIAGNOSIS — M549 Dorsalgia, unspecified: Secondary | ICD-10-CM | POA: Diagnosis not present

## 2022-11-09 DIAGNOSIS — Z833 Family history of diabetes mellitus: Secondary | ICD-10-CM | POA: Insufficient documentation

## 2022-11-09 DIAGNOSIS — Z8249 Family history of ischemic heart disease and other diseases of the circulatory system: Secondary | ICD-10-CM | POA: Insufficient documentation

## 2022-11-09 DIAGNOSIS — Z87891 Personal history of nicotine dependence: Secondary | ICD-10-CM | POA: Diagnosis not present

## 2022-11-09 DIAGNOSIS — Z887 Allergy status to serum and vaccine status: Secondary | ICD-10-CM | POA: Insufficient documentation

## 2022-11-09 DIAGNOSIS — D472 Monoclonal gammopathy: Secondary | ICD-10-CM | POA: Insufficient documentation

## 2022-11-09 DIAGNOSIS — Z8349 Family history of other endocrine, nutritional and metabolic diseases: Secondary | ICD-10-CM | POA: Insufficient documentation

## 2022-11-09 DIAGNOSIS — Z801 Family history of malignant neoplasm of trachea, bronchus and lung: Secondary | ICD-10-CM | POA: Insufficient documentation

## 2022-11-09 DIAGNOSIS — Z8051 Family history of malignant neoplasm of kidney: Secondary | ICD-10-CM | POA: Insufficient documentation

## 2022-11-09 DIAGNOSIS — R140 Abdominal distension (gaseous): Secondary | ICD-10-CM | POA: Diagnosis not present

## 2022-11-09 DIAGNOSIS — Z8052 Family history of malignant neoplasm of bladder: Secondary | ICD-10-CM | POA: Insufficient documentation

## 2022-11-09 DIAGNOSIS — Z881 Allergy status to other antibiotic agents status: Secondary | ICD-10-CM | POA: Diagnosis not present

## 2022-11-09 DIAGNOSIS — Z8042 Family history of malignant neoplasm of prostate: Secondary | ICD-10-CM | POA: Diagnosis not present

## 2022-11-09 DIAGNOSIS — K219 Gastro-esophageal reflux disease without esophagitis: Secondary | ICD-10-CM | POA: Diagnosis not present

## 2022-11-09 NOTE — Assessment & Plan Note (Addendum)
#   MGUS-IgG kappa 0.4 g/dL [JAN 2023]; NOV 2022-CBC chemistries normal limits. MARCH 2024- 0.2 gm/dl;  K/L= 2.0- stable.    # Discussed that in general the risk of transformation to myeloma is about 1 %/year.  Overall low risk of progression to myeloma.  Continue monitoring from 6 months to 12 months. Pt in agreement.   #Joint pain osteoarthritis-clinically not suggestive of multiple myeloma.  Reviewed x-rays from 2022-cervical spine/hips-no lytic lesions noted. Recommend follow up with rheumatology  # DISPOSITION: # follow up in 12 months- MD; 2 week prior-labs- cbc/cmp;MM panel; k/l light chains-Dr.B

## 2022-11-09 NOTE — Progress Notes (Signed)
[facility] CONSULT NOTE  Patient Care Team: Burnard Hawthorne, FNP as PCP - General (Family Medicine) Cammie Sickle, MD as Consulting Physician (Hematology and Oncology)  CHIEF COMPLAINTS/PURPOSE OF CONSULTATION: Monoclonal gammopathy  HEMATOLOGY HISTORY  # IgG kappa [0.4 gm/ld; JAN 2023- Rheumatology; [place]]- NOV 2022- cbc/cmp;[facility].   #Osteoarthritis [Dr.Deweshar; [place]]  HISTORY OF PRESENTING ILLNESS: Alone.  Ambulating independently.  Megan Harper 62 y.o.  female is here for follow-up of monoclonal gammopathy.  Patient continues to have chronic joint pains not any worse. No significant fatigue.  Mild tingling and numbness in extremities.  Review of Systems  Constitutional:  Negative for chills, diaphoresis, fever, malaise/fatigue and weight loss.  HENT:  Negative for nosebleeds and sore throat.   Eyes:  Negative for double vision.  Respiratory:  Negative for cough, hemoptysis, sputum production, shortness of breath and wheezing.   Cardiovascular:  Negative for chest pain, palpitations, orthopnea and leg swelling.  Gastrointestinal:  Negative for abdominal pain, blood in stool, constipation, diarrhea, heartburn, melena, nausea and vomiting.  Genitourinary:  Negative for dysuria, frequency and urgency.  Musculoskeletal:  Positive for back pain and joint pain.  Skin: Negative.  Negative for itching and rash.  Neurological:  Negative for dizziness, tingling, focal weakness, weakness and headaches.  Endo/Heme/Allergies:  Does not bruise/bleed easily.  Psychiatric/Behavioral:  Negative for depression. The patient is not nervous/anxious and does not have insomnia.     MEDICAL HISTORY:  Past Medical History:  Diagnosis Date   Abnormal SPEP    Anxiety    Arthralgia    Cervical dysplasia    COPD (chronic obstructive pulmonary disease) (HCC)    Emphysema of lung (HCC)    Endometriosis    Fibrocystic breast disease    GERD (gastroesophageal reflux disease)     Osteoporosis    Thyroid nodule    Vitamin D insufficiency     SURGICAL HISTORY: Past Surgical History:  Procedure Laterality Date   laproscopy for endometriosis     THYROID LOBECTOMY Right 03/08/2010   TUBAL LIGATION  01/23/1986    SOCIAL HISTORY: Social History   Socioeconomic History   Marital status: Married    Spouse name: Not on file   Number of children: 3   Years of education: Not on file   Highest education level: Not on file  Occupational History   Not on file  Tobacco Use   Smoking status: Former    Packs/day: 2.50    Years: 31.00    Additional pack years: 0.00    Total pack years: 77.50    Types: Cigarettes    Quit date: 09/17/2009    Years since quitting: 13.1   Smokeless tobacco: Never  Vaping Use   Vaping Use: Never used  Substance and Sexual Activity   Alcohol use: No   Drug use: No   Sexual activity: Yes    Birth control/protection: Surgical, Post-menopausal    Comment: Tubal  Other Topics Concern   Not on file  Social History Narrative   Lives in [place]; with husband- 3 dogs. Quit smoking 2010. No alcohol. Used to work at [employer].    Social Determinants of Health   Financial Resource Strain: Not on file  Food Insecurity: Not on file  Transportation Needs: Not on file  Physical Activity: Not on file  Stress: Not on file  Social Connections: Not on file  Intimate Partner Violence: Not on file    FAMILY HISTORY: Family History  Problem Relation Age  of Onset   Uterine cancer Mother    Lung cancer Father        smoked   Bladder Cancer Sister    Esophageal cancer Maternal Aunt    Lymphoma Maternal Aunt    Kidney cancer Maternal Uncle    Prostate cancer Maternal Uncle    Heart disease Maternal Uncle    Heart disease Maternal Grandmother    Hypertension Maternal Grandmother    Thyroid disease Maternal Grandmother    Throat cancer Maternal Grandmother    Prostate cancer Maternal Grandfather    Hypertension Maternal  Grandfather    Colon cancer Maternal Grandfather    Kidney cancer Maternal Grandfather    Heart disease Paternal Grandmother    Diabetes Paternal Grandmother    Breast cancer Neg Hx     ALLERGIES:  is allergic to ceclor [cefaclor], influenza vaccines, and pantoprazole.  MEDICATIONS:  Current Outpatient Medications  Medication Sig Dispense Refill   albuterol (VENTOLIN HFA) 108 (90 Base) MCG/ACT inhaler Inhale 2 puffs into the lungs every 6 (six) hours as needed for wheezing or shortness of breath. 18 g 3   Budeson-Glycopyrrol-Formoterol (BREZTRI AEROSPHERE) 160-9-4.8 MCG/ACT AERO Inhale 2 puffs into the lungs in the morning and at bedtime. 10.7 g 5   Budeson-Glycopyrrol-Formoterol (BREZTRI AEROSPHERE) 160-9-4.8 MCG/ACT AERO Inhale 2 puffs into the lungs in the morning and at bedtime. 5.9 g 0   loratadine (CLARITIN) 10 MG tablet Take 10 mg by mouth daily.     omeprazole (PRILOSEC) 20 MG capsule Take 1 capsule (20 mg total) by mouth 2 (two) times daily before a meal. 60 capsule 6   fluticasone (FLONASE) 50 MCG/ACT nasal spray Place 2 sprays into both nostrils daily. (Patient not taking: Reported on 11/09/2022) 18.2 mL 4   meloxicam (MOBIC) 7.5 MG tablet Take 1 tablet (7.5 mg total) by mouth daily. Hold until cleared by gastroenterology. (Patient not taking: Reported on 11/09/2022) 90 tablet 1   triamcinolone cream (KENALOG) 0.1 % Apply 1 application. topically 2 (two) times daily as needed. (Patient not taking: Reported on 10/22/2022) 30 g 0   No current facility-administered medications for this visit.      PHYSICAL EXAMINATION:   Vitals:   11/09/22 1500  BP: 100/66  Pulse: 60  Resp: 18  Temp: (!) 96.8 F (36 C)   Filed Weights   11/09/22 1500  Weight: 172 lb 14.4 oz (78.4 kg)    Physical Exam Vitals and nursing note reviewed.  HENT:     Head: Normocephalic and atraumatic.     Mouth/Throat:     Pharynx: Oropharynx is clear.  Eyes:     Extraocular Movements: Extraocular  movements intact.     Pupils: Pupils are equal, round, and reactive to light.  Cardiovascular:     Rate and Rhythm: Normal rate and regular rhythm.  Pulmonary:     Comments: Decreased breath sounds bilaterally.  Abdominal:     Palpations: Abdomen is soft.  Musculoskeletal:        General: Normal range of motion.     Cervical back: Normal range of motion.  Skin:    General: Skin is warm.  Neurological:     General: No focal deficit present.     Mental Status: She is alert and oriented to person, place, and time.  Psychiatric:        Behavior: Behavior normal.        Judgment: Judgment normal.     LABORATORY DATA:  I have reviewed the data  as listed Lab Results  Component Value Date   WBC 6.1 10/25/2022   HGB 14.7 10/25/2022   HCT 42.9 10/25/2022   MCV 94.3 10/25/2022   PLT 285 10/25/2022   Recent Labs    11/13/21 1155 04/04/22 1506 10/25/22 1406  NA 140 138 138  K 3.9 3.5 3.6  CL 101 103 102  CO2 32 28 27  GLUCOSE 87 106* 96  BUN 14 11 10   CREATININE 0.66 0.69 0.64  CALCIUM 9.2 9.0 8.9  GFRNONAA  --  >60 >60  PROT 7.2 7.5 7.9  ALBUMIN 4.4 4.0 4.3  AST 20 18 21   ALT 23 17 20   ALKPHOS 80 74 79  BILITOT 0.6 0.4 0.7     US Pelvic Complete With Transvaginal  Result Date: 10/29/2022 CLINICAL DATA:  62 year old female with abdominal distension, concern for Cystocele. EXAM: TRANSABDOMINAL AND TRANSVAGINAL ULTRASOUND OF PELVIS TECHNIQUE: Both transabdominal and transvaginal ultrasound examinations of the pelvis were performed. Transabdominal technique was performed for global imaging of the pelvis including uterus, ovaries, adnexal regions, and pelvic cul-de-sac. It was necessary to proceed with endovaginal exam following the transabdominal exam to visualize the left ovary. COMPARISON:  Abdomen ultrasound the same day reported separately. CT Abdomen and Pelvis 11/23/2021. FINDINGS: Uterus Measurements: 5.8 x 2.5 by 4.6 cm = volume: 47 mL. Small subserosal hypoechoic  fibroid suspected on transvaginal images (series 1001, image 12), about 1 cm. Otherwise negative myometrium. Endometrium Thickness: 5 mm.  No focal abnormality visualized. Right ovary Measurements: Could not be identified despite transabdominal and transvaginal attempts. Right ovary was diminutive, unremarkable by CT last year (series 2, image 66 of that exam). Left ovary Measurements: 2.3 x 1.3 x 2.3 cm = volume: 4 mL. Normal appearance/no adnexal mass. Maintained color Doppler vascularity. Other findings No pelvis free fluid. Unremarkable urinary bladder. Pre and postvoid bladder volumes also were measured on the abdomen ultrasound the same day reported separately. IMPRESSION: 1. Small 1 cm subserosal uterine fundal fibroid. Otherwise normal uterus. 2. Normal left ovary. Right ovary could not be identified, but was unremarkable by CT last year. 3. Unremarkable urinary bladder. Pre and postvoid bladder volumes are reported on Abdomen Ultrasound separately. Electronically Signed   By: Genevie Ann M.D.   On: 10/29/2022 13:16   US Abdomen Complete  Result Date: 10/29/2022 CLINICAL DATA:  62 year old female with abdominal distension, right upper quadrant pain for 2 months. EXAM: ABDOMEN ULTRASOUND COMPLETE COMPARISON:  CT Abdomen and Pelvis 11/23/2021. FINDINGS: Gallbladder: No gallstones or wall thickening visualized. No sonographic Murphy sign noted by sonographer. Common bile duct: Diameter: 3 mm, normal. Liver: No focal lesion identified. Within normal limits in parenchymal echogenicity (series 1, image 39). Portal vein is patent on color Doppler imaging with normal direction of blood flow towards the liver. IVC: No abnormality visualized. Pancreas: Visualized portion unremarkable. Spleen: Size and appearance within normal limits. Right Kidney: Length: 10.3 cm. Echogenicity within normal limits. No mass or hydronephrosis visualized. Stable mild extrarenal pelvis (normal variant). Left Kidney: Length: 10.5 cm.  Echogenicity within normal limits. No mass or hydronephrosis visualized. Abdominal aorta: No aneurysm visualized. Other findings: Estimated prevoid bladder volume of 117 mL. No significant postvoid residual (8 mL). IMPRESSION: Normal Ultrasound appearance of the Abdomen. Electronically Signed   By: Genevie Ann M.D.   On: 10/29/2022 13:11    Lab Results  Component Value Date   KPAFRELGTCHN 28.5 (H) 10/25/2022   KPAFRELGTCHN 29.8 (H) 04/04/2022   LAMBDASER 14.1 10/25/2022   LAMBDASER 15.1 04/04/2022  KAPLAMBRATIO 2.02 (H) 10/25/2022   KAPLAMBRATIO 1.97 (H) 04/04/2022     Monoclonal gammopathy of unknown significance (MGUS) # MGUS-IgG kappa 0.4 g/dL [JAN 2023]; NOV 2022-CBC chemistries normal limits. MARCH 2024- 0.2 gm/dl;  K/L= 2.0- stable.    # Discussed that in general the risk of transformation to myeloma is about 1 %/year.  Overall low risk of progression to myeloma.  Continue monitoring from 6 months to 12 months. Pt in agreement.   #Joint pain osteoarthritis-clinically not suggestive of multiple myeloma.  Reviewed x-rays from 2022-cervical spine/hips-no lytic lesions noted. Recommend follow up with rheumatology  # DISPOSITION: # follow up in 12 months- MD; 2 week prior-labs- cbc/cmp;MM panel; k/l light chains-Dr.B  All questions were answered. The patient knows to call the clinic with any problems, questions or concerns.    Cammie Sickle, MD 11/09/2022 3:22 PM

## 2022-11-09 NOTE — Progress Notes (Signed)
Patient denies new problems/concerns today.   °

## 2022-11-19 ENCOUNTER — Other Ambulatory Visit: Payer: Medicaid Other

## 2022-11-19 ENCOUNTER — Other Ambulatory Visit (INDEPENDENT_AMBULATORY_CARE_PROVIDER_SITE_OTHER): Payer: Medicaid Other

## 2022-11-19 DIAGNOSIS — R140 Abdominal distension (gaseous): Secondary | ICD-10-CM | POA: Diagnosis not present

## 2022-11-19 DIAGNOSIS — E559 Vitamin D deficiency, unspecified: Secondary | ICD-10-CM | POA: Diagnosis not present

## 2022-11-19 DIAGNOSIS — Z13220 Encounter for screening for lipoid disorders: Secondary | ICD-10-CM

## 2022-11-19 DIAGNOSIS — D472 Monoclonal gammopathy: Secondary | ICD-10-CM

## 2022-11-19 DIAGNOSIS — Z136 Encounter for screening for cardiovascular disorders: Secondary | ICD-10-CM

## 2022-11-19 DIAGNOSIS — E538 Deficiency of other specified B group vitamins: Secondary | ICD-10-CM

## 2022-11-19 DIAGNOSIS — E041 Nontoxic single thyroid nodule: Secondary | ICD-10-CM | POA: Diagnosis not present

## 2022-11-19 LAB — CBC WITH DIFFERENTIAL/PLATELET
Basophils Absolute: 0.1 10*3/uL (ref 0.0–0.1)
Basophils Relative: 2 % (ref 0.0–3.0)
Eosinophils Absolute: 0.1 10*3/uL (ref 0.0–0.7)
Eosinophils Relative: 1.3 % (ref 0.0–5.0)
HCT: 41 % (ref 36.0–46.0)
Hemoglobin: 14.3 g/dL (ref 12.0–15.0)
Lymphocytes Relative: 19.9 % (ref 12.0–46.0)
Lymphs Abs: 1 10*3/uL (ref 0.7–4.0)
MCHC: 34.9 g/dL (ref 30.0–36.0)
MCV: 95.8 fl (ref 78.0–100.0)
Monocytes Absolute: 0.3 10*3/uL (ref 0.1–1.0)
Monocytes Relative: 6.4 % (ref 3.0–12.0)
Neutro Abs: 3.6 10*3/uL (ref 1.4–7.7)
Neutrophils Relative %: 70.4 % (ref 43.0–77.0)
Platelets: 281 10*3/uL (ref 150.0–400.0)
RBC: 4.28 Mil/uL (ref 3.87–5.11)
RDW: 12.8 % (ref 11.5–15.5)
WBC: 5.2 10*3/uL (ref 4.0–10.5)

## 2022-11-19 LAB — COMPREHENSIVE METABOLIC PANEL
ALT: 14 U/L (ref 0–35)
AST: 16 U/L (ref 0–37)
Albumin: 4.3 g/dL (ref 3.5–5.2)
Alkaline Phosphatase: 78 U/L (ref 39–117)
BUN: 12 mg/dL (ref 6–23)
CO2: 31 mEq/L (ref 19–32)
Calcium: 9.4 mg/dL (ref 8.4–10.5)
Chloride: 100 mEq/L (ref 96–112)
Creatinine, Ser: 0.75 mg/dL (ref 0.40–1.20)
GFR: 86.35 mL/min (ref >60.00)
Glucose, Bld: 91 mg/dL (ref 70–99)
Potassium: 4.1 mEq/L (ref 3.5–5.1)
Sodium: 139 mEq/L (ref 135–145)
Total Bilirubin: 0.7 mg/dL (ref 0.2–1.2)
Total Protein: 7 g/dL (ref 6.0–8.3)

## 2022-11-19 LAB — LIPID PANEL
Cholesterol: 187 mg/dL (ref 0–200)
HDL: 57.7 mg/dL (ref >39.00)
LDL Cholesterol: 118 mg/dL — ABNORMAL HIGH (ref 0–99)
NonHDL: 129.05
Total CHOL/HDL Ratio: 3
Triglycerides: 56 mg/dL (ref 0.0–149.0)
VLDL: 11.2 mg/dL (ref 0.0–40.0)

## 2022-11-19 LAB — HEMOGLOBIN A1C: Hgb A1c MFr Bld: 5.7 % (ref 4.6–6.5)

## 2022-11-20 LAB — CELIAC DISEASE PANEL
(tTG) Ab, IgA: <1 U/mL
(tTG) Ab, IgG: <1 U/mL
Gliadin IgA: <1 U/mL
Gliadin IgG: <1 U/mL
Immunoglobulin A: 217 mg/dL (ref 47–310)

## 2022-11-20 LAB — B12 AND FOLATE PANEL
Folate: 9 ng/mL (ref >5.9)
Vitamin B-12: 262 pg/mL (ref 211–911)

## 2022-11-20 LAB — CA 125: CA 125: 13 U/mL (ref <35)

## 2022-11-20 LAB — VITAMIN D 25 HYDROXY (VIT D DEFICIENCY, FRACTURES): VITD: 25.53 ng/mL — ABNORMAL LOW (ref 30.00–100.00)

## 2022-11-20 LAB — TSH: TSH: 4.14 u[IU]/mL (ref 0.35–5.50)

## 2022-11-21 ENCOUNTER — Encounter: Payer: Self-pay | Admitting: Family

## 2022-11-21 ENCOUNTER — Ambulatory Visit (INDEPENDENT_AMBULATORY_CARE_PROVIDER_SITE_OTHER): Payer: Medicaid Other | Admitting: Family

## 2022-11-21 VITALS — BP 116/76 | HR 64 | Temp 97.8°F | Ht 66.5 in | Wt 170.8 lb

## 2022-11-21 DIAGNOSIS — Z0000 Encounter for general adult medical examination without abnormal findings: Secondary | ICD-10-CM | POA: Diagnosis not present

## 2022-11-21 DIAGNOSIS — K219 Gastro-esophageal reflux disease without esophagitis: Secondary | ICD-10-CM | POA: Diagnosis not present

## 2022-11-21 DIAGNOSIS — E538 Deficiency of other specified B group vitamins: Secondary | ICD-10-CM | POA: Diagnosis not present

## 2022-11-21 DIAGNOSIS — Z1231 Encounter for screening mammogram for malignant neoplasm of breast: Secondary | ICD-10-CM | POA: Diagnosis not present

## 2022-11-21 DIAGNOSIS — E785 Hyperlipidemia, unspecified: Secondary | ICD-10-CM

## 2022-11-21 MED ORDER — OMEPRAZOLE 20 MG PO CPDR: 20.0000 mg | DELAYED_RELEASE_CAPSULE | Freq: Every day | ORAL | 2 refills | Status: DC | PRN

## 2022-11-21 NOTE — Assessment & Plan Note (Addendum)
Discussed recent lipid panel; fortunately she is low overall cardiovascular risk The 10-year ASCVD risk score (Megan Harper, et al., 2019) is: 2.5% Encouraged low trans and saturated fat diet

## 2022-11-21 NOTE — Progress Notes (Signed)
Assessment & Plan:  Annual physical exam Assessment & Plan: Deferred pelvic exam complaints of patient follows with GYN.  Clinical breast exam performed today.  Encouraged starting dedicated exercise program including walking daily. Reviewed labs with patient today. AAA screen is complete with Abdominal ultrasound 10/29/2022 no aneurysm visualized.  A call out to gastroenterology to clarify when colonoscopy is due.  Overdue right breast mammogram, ultrasound which I have ordered.  Discussed labs during visit   Encounter for screening mammogram for malignant neoplasm of breast -     MM 3D DIAGNOSTIC MAMMOGRAM UNILATERAL RIGHT BREAST; Future -     [place] LIMITED ULTRASOUND INCLUDING AXILLA RIGHT BREAST; Future  Hyperlipidemia, unspecified hyperlipidemia type Assessment & Plan: Discussed recent lipid panel; fortunately she is low overall cardiovascular risk The 10-year ASCVD risk score (Kimie Pidcock DK, et al., 2019) is: 2.5% Encouraged low trans and saturated fat diet   Gastroesophageal reflux disease, unspecified whether esophagitis present -     Omeprazole; Take 1 capsule (20 mg total) by mouth daily as needed (for epigastric burning).  Dispense: 30 capsule; Refill: 2  B12 deficiency -     Methylmalonic acid, serum; Future -     Intrinsic Factor Antibodies; Future -     Homocysteine; Future -     Anti-parietal antibody; Future     Return precautions given.   Risks, benefits, and alternatives of the medications and treatment plan prescribed today were discussed, and patient expressed understanding.   Education regarding symptom management and diagnosis given to patient on AVS either electronically or printed.  No follow-ups on file.  Mable Paris, FNP  Subjective:    Patient ID: DARRAGH DISHAW, female    DOB: 08/23/1961, 62 y.o.   MRN: [id number]  CC: DELBRA MAYBERRY is a 62 y.o. female who presents today for physical exam.    HPI: Feels well today No new complaints    She  continues to follow with Dr. Rogue Bussing for MGUS of unknown significance  Colorectal Cancer Screening: 09/27/2014 ; reported to repeat in 10 years Breast Cancer Screening: Mammogram due right breast interval follow up Cervical Cancer Screening: UTD, Following with Dr Kennon Rounds ASC-US 01/17/22  Bone Health screening/DEXA for 65+: No increased fracture risk. Defer screening at this time.  Lung Cancer Screening: scheduled  AAA screen-she qualifies for due to history of smoking.  Abdominal ultrasound 10/29/2022 no aneurysm visualized.        Tetanus - UTD         Exercise: Gets regular exercise, yard work and walking.  Alcohol use:  none Smoking/tobacco use: former smoker.    Health Maintenance  Topic Date Due   COVID-19 Vaccine (1) Never done   HIV Screening  Never done   Lung Cancer Screening  12/01/2022   INFLUENZA VACCINE  11/25/2022 (Originally 03/27/2022)   Zoster Vaccines- Shingrix (1 of 2) 01/20/2023 (Originally [birth year]01[birth year]1981)   MAMMOGRAM  02/15/2024   COLONOSCOPY (Pts 45-46yrs Insurance coverage will need to be confirmed)  09/27/2024   PAP SMEAR-Modifier  01/17/2025   DTaP/Tdap/Td (7 - Td or Tdap) 12/15/2030   Hepatitis C Screening  Completed   HPV VACCINES  Aged Out    ALLERGIES: Ceclor [cefaclor], Influenza vaccines, and Pantoprazole  Current Outpatient Medications on File Prior to Visit  Medication Sig Dispense Refill   albuterol (VENTOLIN HFA) 108 (90 Base) MCG/ACT inhaler Inhale 2 puffs into the lungs every 6 (six) hours as needed for wheezing or shortness of breath. 18 g 3   Budeson-Glycopyrrol-Formoterol (  BREZTRI AEROSPHERE) 160-9-4.8 MCG/ACT AERO Inhale 2 puffs into the lungs in the morning and at bedtime. 10.7 g 5   Budeson-Glycopyrrol-Formoterol (BREZTRI AEROSPHERE) 160-9-4.8 MCG/ACT AERO Inhale 2 puffs into the lungs in the morning and at bedtime. 5.9 g 0   loratadine (CLARITIN) 10 MG tablet Take 10 mg by mouth daily.     No current facility-administered medications on file  prior to visit.    Review of Systems  Constitutional:  Negative for chills, fever and unexpected weight change.  HENT:  Negative for congestion.   Respiratory:  Negative for cough.   Cardiovascular:  Negative for chest pain, palpitations and leg swelling.  Gastrointestinal:  Negative for nausea and vomiting.  Musculoskeletal:  Negative for arthralgias and myalgias.  Skin:  Negative for rash.  Neurological:  Negative for headaches.  Hematological:  Negative for adenopathy.  Psychiatric/Behavioral:  Negative for confusion.       Objective:    BP 116/76   Pulse 64   Temp 97.8 F (36.6 C) (Oral)   Ht 5' 6.5" (1.689 m)   Wt 170 lb 12.8 oz (77.5 kg)   SpO2 98%   BMI 27.15 kg/m   BP Readings from Last 3 Encounters:  11/21/22 116/76  11/09/22 100/66  10/22/22 124/80   Wt Readings from Last 3 Encounters:  11/21/22 170 lb 12.8 oz (77.5 kg)  11/09/22 172 lb 14.4 oz (78.4 kg)  10/22/22 171 lb 3.2 oz (77.7 kg)    Physical Exam Vitals reviewed.  Constitutional:      Appearance: Normal appearance. She is well-developed.  Eyes:     Conjunctiva/sclera: Conjunctivae normal.  Neck:     Thyroid: No thyroid mass or thyromegaly.  Cardiovascular:     Rate and Rhythm: Normal rate and regular rhythm.     Pulses: Normal pulses.     Heart sounds: Normal heart sounds.  Pulmonary:     Effort: Pulmonary effort is normal.     Breath sounds: Normal breath sounds. No wheezing, rhonchi or rales.  Chest:  Breasts:    Breasts are symmetrical.     Right: No inverted nipple, mass, nipple discharge, skin change or tenderness.     Left: No inverted nipple, mass, nipple discharge, skin change or tenderness.  Abdominal:     General: Bowel sounds are normal. There is no distension.     Palpations: Abdomen is soft. Abdomen is not rigid. There is no fluid wave or mass.     Tenderness: There is no abdominal tenderness. There is no guarding or rebound.  Lymphadenopathy:     Head:     Right side of  head: No submental, submandibular, tonsillar, preauricular, posterior auricular or occipital adenopathy.     Left side of head: No submental, submandibular, tonsillar, preauricular, posterior auricular or occipital adenopathy.     Cervical: No cervical adenopathy.     Right cervical: No superficial, deep or posterior cervical adenopathy.    Left cervical: No superficial, deep or posterior cervical adenopathy.  Skin:    General: Skin is warm and dry.  Neurological:     Mental Status: She is alert.  Psychiatric:        Speech: Speech normal.        Behavior: Behavior normal.        Thought Content: Thought content normal.

## 2022-11-21 NOTE — Assessment & Plan Note (Addendum)
Deferred pelvic exam complaints of patient follows with GYN.  Clinical breast exam performed today.  Encouraged starting dedicated exercise program including walking daily. Reviewed labs with patient today. AAA screen is complete with Abdominal ultrasound 10/29/2022 no aneurysm visualized.  A call out to gastroenterology to clarify when colonoscopy is due.  Overdue right breast mammogram, ultrasound which I have ordered.  Discussed labs during visit

## 2022-11-21 NOTE — Patient Instructions (Addendum)
Overdue for right breast ultrasound, diagnostic mammogram.  I ordered these images  Let us know if you dont hear back within a week in regards to an appointment being scheduled.   So that you are aware, if you are [facility] MyChart user , please pay attention to your MyChart messages as you may receive a MyChart message with a phone number to call and schedule this test/appointment own your own from our referral coordinator. This is a new process so I do not want you to miss this message.  If you are not a MyChart user, you will receive a phone call.    Vitamin D is slightly low. Please start cholecalciferol 800 units daily. You may find this over the counter in the drug store.   Please call to arrange follow up with me a few months and request vitamin d to be drawn then.   Please ensure you are following a diet high in calcium -- research shows better outcomes with dietary sources including kale, yogurt, broccolii, cheese, okra, almonds- to name a few.     Health Maintenance for Postmenopausal Women Menopause is a normal process in which your ability to get pregnant comes to an end. This process happens slowly over many months or years, usually between the ages of 61 and 10. Menopause is complete when you have missed your menstrual period for 12 months. It is important to talk with your health care provider about some of the most common conditions that affect women after menopause (postmenopausal women). These include heart disease, cancer, and bone loss (osteoporosis). Adopting a healthy lifestyle and getting preventive care can help to promote your health and wellness. The actions you take can also lower your chances of developing some of these common conditions. What are the signs and symptoms of menopause? During menopause, you may have the following symptoms: Hot flashes. These can be moderate or severe. Night sweats. Decrease in sex drive. Mood swings. Headaches. Tiredness  (fatigue). Irritability. Memory problems. Problems falling asleep or staying asleep. Talk with your health care provider about treatment options for your symptoms. Do I need hormone replacement therapy? Hormone replacement therapy is effective in treating symptoms that are caused by menopause, such as hot flashes and night sweats. Hormone replacement carries certain risks, especially as you become older. If you are thinking about using estrogen or estrogen with progestin, discuss the benefits and risks with your health care provider. How can I reduce my risk for heart disease and stroke? The risk of heart disease, heart attack, and stroke increases as you age. One of the causes may be a change in the body's hormones during menopause. This can affect how your body uses dietary fats, triglycerides, and cholesterol. Heart attack and stroke are medical emergencies. There are many things that you can do to help prevent heart disease and stroke. Watch your blood pressure High blood pressure causes heart disease and increases the risk of stroke. This is more likely to develop in people who have high blood pressure readings or are overweight. Have your blood pressure checked: Every 3-5 years if you are 67-50 years of age. Every year if you are 78 years old or older. Eat a healthy diet  Eat a diet that includes plenty of vegetables, fruits, low-fat dairy products, and lean protein. Do not eat a lot of foods that are high in solid fats, added sugars, or sodium. Get regular exercise Get regular exercise. This is one of the most important things you can do  for your health. Most adults should: Try to exercise for at least 150 minutes each week. The exercise should increase your heart rate and make you sweat (moderate-intensity exercise). Try to do strengthening exercises at least twice each week. Do these in addition to the moderate-intensity exercise. Spend less time sitting. Even light physical activity  can be beneficial. Other tips Work with your health care provider to achieve or maintain a healthy weight. Do not use any products that contain nicotine or tobacco. These products include cigarettes, chewing tobacco, and vaping devices, such as e-cigarettes. If you need help quitting, ask your health care provider. Know your numbers. Ask your health care provider to check your cholesterol and your blood sugar (glucose). Continue to have your blood tested as directed by your health care provider. Do I need screening for cancer? Depending on your health history and family history, you may need to have cancer screenings at different stages of your life. This may include screening for: Breast cancer. Cervical cancer. Lung cancer. Colorectal cancer. What is my risk for osteoporosis? After menopause, you may be at increased risk for osteoporosis. Osteoporosis is a condition in which bone destruction happens more quickly than new bone creation. To help prevent osteoporosis or the bone fractures that can happen because of osteoporosis, you may take the following actions: If you are 78-70 years old, get at least 1,000 mg of calcium and at least 600 international units (IU) of vitamin D per day. If you are older than age 75 but younger than age 8, get at least 1,200 mg of calcium and at least 600 international units (IU) of vitamin D per day. If you are older than age 26, get at least 1,200 mg of calcium and at least 800 international units (IU) of vitamin D per day. Smoking and drinking excessive alcohol increase the risk of osteoporosis. Eat foods that are rich in calcium and vitamin D, and do weight-bearing exercises several times each week as directed by your health care provider. How does menopause affect my mental health? Depression may occur at any age, but it is more common as you become older. Common symptoms of depression include: Feeling depressed. Changes in sleep patterns. Changes in  appetite or eating patterns. Feeling an overall lack of motivation or enjoyment of activities that you previously enjoyed. Frequent crying spells. Talk with your health care provider if you think that you are experiencing any of these symptoms. General instructions See your health care provider for regular wellness exams and vaccines. This may include: Scheduling regular health, dental, and eye exams. Getting and maintaining your vaccines. These include: Influenza vaccine. Get this vaccine each year before the flu season begins. Pneumonia vaccine. Shingles vaccine. Tetanus, diphtheria, and pertussis (Tdap) booster vaccine. Your health care provider may also recommend other immunizations. Tell your health care provider if you have ever been abused or do not feel safe at home. Summary Menopause is a normal process in which your ability to get pregnant comes to an end. This condition causes hot flashes, night sweats, decreased interest in sex, mood swings, headaches, or lack of sleep. Treatment for this condition may include hormone replacement therapy. Take actions to keep yourself healthy, including exercising regularly, eating a healthy diet, watching your weight, and checking your blood pressure and blood sugar levels. Get screened for cancer and depression. Make sure that you are up to date with all your vaccines. This information is not intended to replace advice given to you by your health care  provider. Make sure you discuss any questions you have with your health care provider. Document Revised: 01/02/2021 Document Reviewed: 01/02/2021 Elsevier Patient Education  [place].

## 2022-11-22 ENCOUNTER — Other Ambulatory Visit: Payer: Self-pay

## 2022-11-22 DIAGNOSIS — E538 Deficiency of other specified B group vitamins: Secondary | ICD-10-CM

## 2022-11-22 NOTE — Progress Notes (Signed)
Called Dr [facility] and spoke to receptionist and she stated that pt has to come back Feb 2026 for colonoscopy. Called and informed pt as well

## 2022-12-03 ENCOUNTER — Other Ambulatory Visit: Payer: Self-pay

## 2022-12-03 ENCOUNTER — Ambulatory Visit
Admission: RE | Admit: 2022-12-03 | Discharge: 2022-12-03 | Disposition: A | Discharge status: Home / Self Care | Payer: Medicaid Other | Source: Ambulatory Visit | Attending: Acute Care | Admitting: Acute Care

## 2022-12-03 DIAGNOSIS — Z122 Encounter for screening for malignant neoplasm of respiratory organs: Secondary | ICD-10-CM

## 2022-12-03 DIAGNOSIS — Z87891 Personal history of nicotine dependence: Secondary | ICD-10-CM | POA: Insufficient documentation

## 2022-12-04 ENCOUNTER — Telehealth: Payer: Self-pay | Admitting: Family

## 2022-12-04 NOTE — Telephone Encounter (Signed)
Call patient Please make an appointment to discuss CT lung cancer screening results including atherosclerosis

## 2022-12-04 NOTE — Telephone Encounter (Signed)
Spoke to pt but she is confused about this note. Does she need to get labs done first before beginning the b12 injections. Or begin b 12  injections and then labs..   Negative celiac disease.   B12 is low end of normal which can cause neuropathy ( numbness, tingling), memory changes, and fatigue.  Optimal level of B12 is greater than 400.   I have ordered more labs to evaluate for this. Please call to schedule these non fasting labs in the next couple of weeks.     I would also like to start you on b12 injections, once per week for four total weeks, and then monthly for the months thereafter. We will do this in the office and you may call to schedule nurse visits for these. Ensure you have follow up with me in the next 3-4 months to see if you have improvement with injections.   Vitamin D is slightly low. Please start cholecalciferol 800 units daily. You may find this over the counter in the drug store.   Please call to arrange follow up with me a few months and request vitamin d to be drawn then.   Please ensure you are following a diet high in calcium -- research shows better outcomes with dietary sources including kale, yogurt, broccolii, cheese, okra, almonds- to name a few.           Regards, Megan Harper  Written by Allegra Grana, FNP on 11/21/2022  2:11 PM EDT Seen by patient Megan Harper on 12/03/2022  4:50 PM

## 2022-12-06 ENCOUNTER — Ambulatory Visit
Admission: RE | Admit: 2022-12-06 | Discharge: 2022-12-06 | Disposition: A | Discharge status: Home / Self Care | Payer: Medicaid Other | Source: Ambulatory Visit | Attending: Family | Admitting: Family

## 2022-12-06 DIAGNOSIS — Z1231 Encounter for screening mammogram for malignant neoplasm of breast: Secondary | ICD-10-CM

## 2022-12-06 DIAGNOSIS — Z1239 Encounter for other screening for malignant neoplasm of breast: Secondary | ICD-10-CM | POA: Diagnosis present

## 2022-12-06 DIAGNOSIS — N6489 Other specified disorders of breast: Secondary | ICD-10-CM | POA: Diagnosis not present

## 2022-12-07 ENCOUNTER — Other Ambulatory Visit: Payer: Self-pay | Admitting: Family

## 2022-12-07 DIAGNOSIS — Z124 Encounter for screening for malignant neoplasm of cervix: Secondary | ICD-10-CM

## 2022-12-07 DIAGNOSIS — Z1231 Encounter for screening mammogram for malignant neoplasm of breast: Secondary | ICD-10-CM

## 2022-12-07 NOTE — Telephone Encounter (Signed)
lvm to inform pt that per margaret  Please tell her she can start B12 injections right away.  Sch labs in 4 weeks after she has completed 4 weeks of b12.

## 2022-12-10 ENCOUNTER — Ambulatory Visit (INDEPENDENT_AMBULATORY_CARE_PROVIDER_SITE_OTHER): Payer: Medicaid Other

## 2022-12-10 DIAGNOSIS — E538 Deficiency of other specified B group vitamins: Secondary | ICD-10-CM

## 2022-12-10 MED ORDER — CYANOCOBALAMIN 1000 MCG/ML IJ SOLN
1000.0000 ug | Freq: Once | INTRAMUSCULAR | Status: AC
  Administered 2022-12-10: 1000 ug via INTRAMUSCULAR

## 2022-12-10 NOTE — Progress Notes (Signed)
Pt presented for their vitamin B12 injection. Pt was identified through two identifiers. Pt tolerated shot well in their right deltoid.  

## 2022-12-10 NOTE — Telephone Encounter (Signed)
Spoke to pt about message below and pt verbalized understanding

## 2022-12-12 ENCOUNTER — Encounter: Payer: Self-pay | Admitting: Family

## 2022-12-12 ENCOUNTER — Telehealth: Payer: Self-pay | Admitting: Family

## 2022-12-12 DIAGNOSIS — I700 Atherosclerosis of aorta: Secondary | ICD-10-CM | POA: Insufficient documentation

## 2022-12-12 NOTE — Telephone Encounter (Signed)
Call patient Please schedule virtual or iin  person visit to discuss results of CT chest.  There is evidence of emphysema, atherosclerosis which is essentially cholesterol or plaque.  The  report notes left thyroid nodule.  Please advise her to keep thyroid ultrasound as scheduled  She will need to repeat the CT chest in 1 years time.  This will be ordered by pulmonology

## 2022-12-12 NOTE — Telephone Encounter (Signed)
Noted. Pt declined appt. 

## 2022-12-12 NOTE — Telephone Encounter (Signed)
Spoke to pt and she stated that pulmonology had already contacted her in regards to her results and doesn't need to schedule appt

## 2022-12-17 ENCOUNTER — Ambulatory Visit (INDEPENDENT_AMBULATORY_CARE_PROVIDER_SITE_OTHER): Payer: Medicaid Other

## 2022-12-17 ENCOUNTER — Telehealth: Payer: Self-pay

## 2022-12-17 DIAGNOSIS — E538 Deficiency of other specified B group vitamins: Secondary | ICD-10-CM

## 2022-12-17 MED ORDER — CYANOCOBALAMIN 1000 MCG/ML IJ SOLN
1000.0000 ug | Freq: Once | INTRAMUSCULAR | Status: AC
  Administered 2022-12-17: 1000 ug via INTRAMUSCULAR

## 2022-12-17 NOTE — Telephone Encounter (Signed)
NOTED

## 2022-12-17 NOTE — Progress Notes (Signed)
Pt presented for their vitamin B12 injection. Pt was identified through two identifiers. Pt tolerated shot well in their right deltoid.  

## 2022-12-17 NOTE — Telephone Encounter (Signed)
Patient states she is returning our call regarding her results.  I transferred call to Jenate Jordan, CMA. 

## 2022-12-17 NOTE — Telephone Encounter (Signed)
LVM to call back to go over results 

## 2022-12-18 ENCOUNTER — Encounter: Payer: Medicaid Other | Admitting: Obstetrics and Gynecology

## 2022-12-18 ENCOUNTER — Encounter: Payer: Self-pay | Admitting: Family

## 2022-12-24 ENCOUNTER — Ambulatory Visit (INDEPENDENT_AMBULATORY_CARE_PROVIDER_SITE_OTHER): Payer: Medicaid Other

## 2022-12-24 DIAGNOSIS — E538 Deficiency of other specified B group vitamins: Secondary | ICD-10-CM

## 2022-12-24 MED ORDER — CYANOCOBALAMIN 1000 MCG/ML IJ SOLN
1000.0000 ug | Freq: Once | INTRAMUSCULAR | Status: AC
  Administered 2022-12-24: 1000 ug via INTRAMUSCULAR

## 2022-12-24 NOTE — Progress Notes (Signed)
Patient arrived for a B12 injection and it was administered into her left deltoid. Patient tolerated the injection well and did not show any signs of distress or voice any concerns. 

## 2022-12-26 ENCOUNTER — Ambulatory Visit (INDEPENDENT_AMBULATORY_CARE_PROVIDER_SITE_OTHER): Payer: Medicaid Other | Admitting: Obstetrics and Gynecology

## 2022-12-26 ENCOUNTER — Encounter: Payer: Self-pay | Admitting: Obstetrics and Gynecology

## 2022-12-26 VITALS — BP 117/56 | HR 67 | Resp 16 | Ht 66.5 in | Wt 172.1 lb

## 2022-12-26 DIAGNOSIS — N812 Incomplete uterovaginal prolapse: Secondary | ICD-10-CM | POA: Diagnosis not present

## 2022-12-26 NOTE — Progress Notes (Addendum)
OB[facility]YNECOLOGY CLINIC PROGRESS NOTE Subjective:    Megan Harper is a postmenopausal 62 y.o. [phone] female who presents for evaluation of a cystocele. Referred from PCP at [facility] Rennie Plowman, FNP).  Notes that she feels irritation and a bulge when moving. Also notes a history of vaginitis, which was likely the cause of her irritation. Problem started 1  year ago. Denies issues with urinary leakage or bowel movements. Symptoms have not worsened over time.  Reports having an ultrasound to assess her bladder, and was at the time noting abdominal distention. No significant findings noted except for 1 cm fibroid.    OB History  Gravida Para Term Preterm AB Living  3 3 2 1   3   SAB IAB Ectopic Multiple Live Births          3    # Outcome Date GA Lbr Len/2nd Weight Sex Delivery Anes PTL Lv  3 Preterm      Vag-Spont     2 Term      Vag-Spont     1 Term      Vag-Spont         Past Medical History:  Diagnosis Date   Abnormal SPEP    Anxiety    Arthralgia    Cervical dysplasia    COPD (chronic obstructive pulmonary disease) (HCC)    Emphysema of lung (HCC)    Endometriosis    Fibrocystic breast disease    GERD (gastroesophageal reflux disease)    Osteoporosis    Thyroid nodule    Vitamin D insufficiency     Family History  Problem Relation Age of Onset   Uterine cancer Mother    Lung cancer Father        smoked   Bladder Cancer Sister    Esophageal cancer Maternal Aunt    Lymphoma Maternal Aunt    Kidney cancer Maternal Uncle    Prostate cancer Maternal Uncle    Heart disease Maternal Uncle    Heart disease Maternal Grandmother    Hypertension Maternal Grandmother    Thyroid disease Maternal Grandmother    Throat cancer Maternal Grandmother    Prostate cancer Maternal Grandfather    Hypertension Maternal Grandfather    Colon cancer Maternal Grandfather    Kidney cancer Maternal Grandfather    Heart disease Paternal Grandmother     Diabetes Paternal Grandmother    Breast cancer Neg Hx     Past Surgical History:  Procedure Laterality Date   laproscopy for endometriosis     THYROID LOBECTOMY Right 03/08/2010   TUBAL LIGATION  01/23/1986    Social History   Socioeconomic History   Marital status: Married    Spouse name: Not on file   Number of children: 3   Years of education: Not on file   Highest education level: Not on file  Occupational History   Not on file  Tobacco Use   Smoking status: Former    Packs/day: 2.50    Years: 31.00    Additional pack years: 0.00    Total pack years: 77.50    Types: Cigarettes    Quit date: 09/17/2009    Years since quitting: 13.2   Smokeless tobacco: Never  Vaping Use   Vaping Use: Never used  Substance and Sexual Activity   Alcohol use: No   Drug use: No   Sexual activity: Yes    Birth control/protection: Surgical, Post-menopausal    Comment: Tubal  Other Topics Concern  Not on file  Social History Narrative   Lives in [place]; with husband- 3 dogs. Quit smoking 2010. No alcohol. Used to work at [employer].    Social Determinants of Health   Financial Resource Strain: Not on file  Food Insecurity: Not on file  Transportation Needs: Not on file  Physical Activity: Not on file  Stress: Not on file  Social Connections: Not on file  Intimate Partner Violence: Not on file    Current Outpatient Medications on File Prior to Visit  Medication Sig Dispense Refill   albuterol (VENTOLIN HFA) 108 (90 Base) MCG/ACT inhaler Inhale 2 puffs into the lungs every 6 (six) hours as needed for wheezing or shortness of breath. 18 g 3   Budeson-Glycopyrrol-Formoterol (BREZTRI AEROSPHERE) 160-9-4.8 MCG/ACT AERO Inhale 2 puffs into the lungs in the morning and at bedtime. 10.7 g 5   Budeson-Glycopyrrol-Formoterol (BREZTRI AEROSPHERE) 160-9-4.8 MCG/ACT AERO Inhale 2 puffs into the lungs in the morning and at bedtime. 5.9 g 0   loratadine (CLARITIN) 10 MG tablet Take 10 mg  by mouth daily.     omeprazole (PRILOSEC) 20 MG capsule Take 1 capsule (20 mg total) by mouth daily as needed (for epigastric burning). 30 capsule 2   No current facility-administered medications on file prior to visit.    Allergies  Allergen Reactions   Ceclor [Cefaclor] Other (See Comments)    Flu like symptoms   Influenza Vaccines Nausea And Vomiting    Fever, sweating eyes bloodshot nausea also   Pantoprazole Itching     Review of Systems Pertinent items noted in HPI and remainder of comprehensive ROS otherwise negative.   Objective:     BP (!) 117/56   Pulse 67   Resp 16   Ht 5' 6.5" (1.689 m)   Wt 172 lb 1.6 oz (78.1 kg)   BMI 27.36 kg/m   General appearance: alert and no distress Abdomen: soft, non-tender; bowel sounds normal; no masses,  no organomegaly Pelvic:   Pelvic exam: VULVA: normal appearing vulva with no masses, tenderness or lesions VAGINA: mildly atrophic, PELVIC FLOOR EXAM: cystocele Grade 2-3, uterine descensus grade 2 CERVIX: normal appearing cervix without discharge or lesions UTERUS: uterus is normal size, shape, consistency and nontender ADNEXA: normal adnexa in size, nontender and no masses RECTAL: normal rectal, no masses, rectal exam not indicated.  Extremities: extremities normal, atraumatic, no cyanosis or edema Neurologic: Grossly normal   Imaging:  CLINICAL DATA:  62 year old female with abdominal distension, concern for Cystocele.   EXAM: TRANSABDOMINAL AND TRANSVAGINAL ULTRASOUND OF PELVIS   TECHNIQUE: Both transabdominal and transvaginal ultrasound examinations of the pelvis were performed. Transabdominal technique was performed for global imaging of the pelvis including uterus, ovaries, adnexal regions, and pelvic cul-de-sac. It was necessary to proceed with endovaginal exam following the transabdominal exam to visualize the left ovary.   COMPARISON:  Abdomen ultrasound the same day reported separately. CT Abdomen and  Pelvis 11/23/2021.   FINDINGS: Uterus   Measurements: 5.8 x 2.5 by 4.6 cm = volume: 47 mL. Small subserosal hypoechoic fibroid suspected on transvaginal images (series 1001, image 12), about 1 cm. Otherwise negative myometrium.   Endometrium   Thickness: 5 mm.  No focal abnormality visualized.   Right ovary   Measurements: Could not be identified despite transabdominal and transvaginal attempts. Right ovary was diminutive, unremarkable by CT last year (series 2, image 66 of that exam).   Left ovary   Measurements: 2.3 x 1.3 x 2.3 cm = volume:  4 mL. Normal appearance/no adnexal mass. Maintained color Doppler vascularity.   Other findings   No pelvis free fluid.   Unremarkable urinary bladder. Pre and postvoid bladder volumes also were measured on the abdomen ultrasound the same day reported separately.   IMPRESSION: 1. Small 1 cm subserosal uterine fundal fibroid. Otherwise normal uterus. 2. Normal left ovary. Right ovary could not be identified, but was unremarkable by CT last year. 3. Unremarkable urinary bladder. Pre and postvoid bladder volumes are reported on Abdomen Ultrasound separately.     Electronically Signed   By: Odessa Fleming M.D.   On: 10/29/2022 13:16    CLINICAL DATA:  62 year old female with abdominal distension, right upper quadrant pain for 2 months.   EXAM: ABDOMEN ULTRASOUND COMPLETE   COMPARISON:  CT Abdomen and Pelvis 11/23/2021.   FINDINGS: Gallbladder: No gallstones or wall thickening visualized. No sonographic Murphy sign noted by sonographer.   Common bile duct: Diameter: 3 mm, normal.   Liver: No focal lesion identified. Within normal limits in parenchymal echogenicity (series 1, image 39). Portal vein is patent on color Doppler imaging with normal direction of blood flow towards the liver.   IVC: No abnormality visualized.   Pancreas: Visualized portion unremarkable.   Spleen: Size and appearance within normal limits.    Right Kidney: Length: 10.3 cm. Echogenicity within normal limits. No mass or hydronephrosis visualized. Stable mild extrarenal pelvis (normal variant).   Left Kidney: Length: 10.5 cm. Echogenicity within normal limits. No mass or hydronephrosis visualized.   Abdominal aorta: No aneurysm visualized.   Other findings: Estimated prevoid bladder volume of 117 mL. No significant postvoid residual (8 mL).   IMPRESSION: Normal Ultrasound appearance of the Abdomen.     Electronically Signed   By: Odessa Fleming M.D.   On: 10/29/2022 13:11  Assessment:    The patient has a cystocele and uterine prolapse.   Plan:    Discussed cystoceles/rectoceles and management options with the patient. All questions answered. Agricultural engineer distributed. Follow up in 2-4 weeks for further discussion of management options and possible pessary fitting if desired.

## 2022-12-31 ENCOUNTER — Ambulatory Visit
Admission: RE | Admit: 2022-12-31 | Discharge: 2022-12-31 | Disposition: A | Discharge status: Home / Self Care | Payer: Medicaid Other | Source: Ambulatory Visit | Attending: Family | Admitting: Family

## 2022-12-31 ENCOUNTER — Ambulatory Visit (INDEPENDENT_AMBULATORY_CARE_PROVIDER_SITE_OTHER): Payer: Medicaid Other

## 2022-12-31 DIAGNOSIS — E041 Nontoxic single thyroid nodule: Secondary | ICD-10-CM | POA: Insufficient documentation

## 2022-12-31 DIAGNOSIS — E538 Deficiency of other specified B group vitamins: Secondary | ICD-10-CM | POA: Diagnosis not present

## 2022-12-31 MED ORDER — CYANOCOBALAMIN 1000 MCG/ML IJ SOLN
1000.0000 ug | Freq: Once | INTRAMUSCULAR | Status: AC
  Administered 2022-12-31: 1000 ug via INTRAMUSCULAR

## 2022-12-31 NOTE — Progress Notes (Signed)
Pt presented for their vitamin B12 injection. Pt was identified through two identifiers. Pt tolerated shot well in their left  deltoid.  

## 2023-01-09 ENCOUNTER — Other Ambulatory Visit (INDEPENDENT_AMBULATORY_CARE_PROVIDER_SITE_OTHER): Payer: Medicaid Other

## 2023-01-09 DIAGNOSIS — E538 Deficiency of other specified B group vitamins: Secondary | ICD-10-CM

## 2023-01-09 NOTE — Addendum Note (Signed)
Addended by: Warden Fillers on: 01/09/2023 02:02 PM   Modules accepted: Orders

## 2023-01-12 LAB — ANTI-PARIETAL ANTIBODY: PARIETAL CELL AB SCREEN: NEGATIVE

## 2023-01-12 LAB — VITAMIN B12: Vitamin B-12: 695 pg/mL (ref 200–1100)

## 2023-01-12 LAB — HOMOCYSTEINE: Homocysteine: 10.7 umol/L — ABNORMAL HIGH (ref <10.4)

## 2023-01-12 LAB — METHYLMALONIC ACID, SERUM: Methylmalonic Acid, Quant: 188 nmol/L (ref 87–318)

## 2023-01-12 LAB — INTRINSIC FACTOR ANTIBODIES: Intrinsic Factor: NEGATIVE

## 2023-01-16 ENCOUNTER — Encounter: Payer: Self-pay | Admitting: Pulmonary Disease

## 2023-01-16 ENCOUNTER — Ambulatory Visit: Payer: Medicaid Other | Admitting: Pulmonary Disease

## 2023-01-16 VITALS — BP 122/70 | HR 66 | Ht 66.5 in | Wt 170.0 lb

## 2023-01-16 DIAGNOSIS — J4489 Other specified chronic obstructive pulmonary disease: Secondary | ICD-10-CM

## 2023-01-16 DIAGNOSIS — Z87891 Personal history of nicotine dependence: Secondary | ICD-10-CM | POA: Diagnosis not present

## 2023-01-16 DIAGNOSIS — J439 Emphysema, unspecified: Secondary | ICD-10-CM

## 2023-01-16 NOTE — Progress Notes (Signed)
Megan Harper    [id number]    1961-04-15  Primary Care Physician:Arnett, Lyn Records, FNP  Referring Physician: Allegra Grana, FNP [address] [place],  [place] [address]  Chief complaint: Follow-up for COPD  HPI: Megan Harper is a 62 y.o. with past medical history of heavy smoking, hypothyroidism. Complains of dyspnea on exertion since 2010. Symptoms are worsening over the past year. She cannot climb more than a flight of stairs. Her symptoms associated with occasional cough, nonproductive in nature and wheezing. She was given Symbicort several years ago but did not use it. She just on albuterol inhaler which she does not use often. She has stuffy nose but no seasonal allergies, postnasal drip. She has significant issues with acid reflux and is on Nexium. She had a normal EGD in 2016.   She's had workup as noted below which included a cardiac pulmonary exercise test. Evaluated by her cardiologist Dr. Jacinto Halim with echo suggestive of elevated right pressures (see report below). She has thyroid surgery as few years ago and noted weight gain since then. She is trying to lose weight with exercise program and diet.   78-pack-year smoker.  Quit in 2011.  Interim History: She is not on any controller medication. Breztri prescribed at last visit but she cannot afford it due to lack of insurance.  Overall states that her breathing is doing well with no issues.  Denies any dyspnea on exertion, cough, congestion.  Outpatient Encounter Medications as of 01/16/2023  Medication Sig   albuterol (VENTOLIN HFA) 108 (90 Base) MCG/ACT inhaler Inhale 2 puffs into the lungs every 6 (six) hours as needed for wheezing or shortness of breath.   loratadine (CLARITIN) 10 MG tablet Take 10 mg by mouth daily.   omeprazole (PRILOSEC) 20 MG capsule Take 1 capsule (20 mg total) by mouth daily as needed (for epigastric burning).   Budeson-Glycopyrrol-Formoterol (BREZTRI AEROSPHERE) 160-9-4.8  MCG/ACT AERO Inhale 2 puffs into the lungs in the morning and at bedtime. (Patient not taking: Reported on 01/16/2023)   [DISCONTINUED] Budeson-Glycopyrrol-Formoterol (BREZTRI AEROSPHERE) 160-9-4.8 MCG/ACT AERO Inhale 2 puffs into the lungs in the morning and at bedtime.   No facility-administered encounter medications on file as of 01/16/2023.   Physical Exam: Blood pressure 122/70, pulse 66, height 5' 6.5" (1.689 m), weight 170 lb (77.1 kg), SpO2 96 %. Gen:      No acute distress HEENT:  EOMI, sclera anicteric Neck:     No masses; no thyromegaly Lungs:    Clear to auscultation bilaterally; normal respiratory effort CV:         Regular rate and rhythm; no murmurs Abd:      + bowel sounds; soft, non-tender; no palpable masses, no distension Ext:    No edema; adequate peripheral perfusion Skin:      Warm and dry; no rash Neuro: alert and oriented x 3 Psych: normal mood and affect   Data Reviewed: Imaging CT chest 05/26/2019  Stable granulomas, emphysema  CT chest 10/18/2020 Stable calcified granulomas, centrilobular emphysema.  CT chest 11/30/2021-emphysema, scattered calcified granulomas are stable  Screening CT chest 12/03/2022-mild emphysema with diffuse bronchial wall thickening. I have reviewed the images personally.  PFTs: 07/19/14 FVC 2.31 (61%), FEV1 1.55 (52%), F/F 67, TLC 89%, RV/TLC 137, DLCO 39% Moderate obstructive disease with slight bronchial dilator response Severe diffusion defect which corrects partially for alveolar volume.  06/15/2018 FVC 2.28 [62%], FEV1 1.57 [55%), F/F 69, TLC 87%, RV/TLC  152%, DLCO 72% Severe obstruction with bronchodilator response, air trapping, minimal diffusion defect.  03/28/2022 FVC 2.16 [60%], FEV1 1.27 [46%], F/F 59, TLC 4.87 [90%], DLCO 17.40 [80%] Severe obstruction with bronchodilator  Cardiac CPET 01/02/16 Exercise testing with gas exchange demonstrates normal functional capacity when compared to matched sedentary Norms.There is  no cardiac limitation noted.  At peak exercise, patient appears limited due to her body habitus with related restrictive lung physiology and mild obstruction. Would recommend continued follow-up with Pulmonary as well as weight loss.    Echocardiogram 12/27/15 Normal LV cavity size and function, normal diastolic filling pattern EF 61% Trace MR, mild TR. No evidence of pulmonary hypertension. Trace PR. IVC is dilated with respiratory variation. Suggest elevated right heart pressure.   Labs: CBC 07/23/2017-WBC 8.6, eos 0.4%, absolute eosinophil count 35 Alpha-1 antitrypsin 05/26/2019-142, PI MM  Assessment:  COPD PFTs show severe obstruction and a bronchodilator response Has not been on controller medication for a while  Unable to afford Breztri. She is on Medicare now and will check with insurance company to see what is covered and let us know.  Prior CPET shows exercise limitation from obesity and deconditioning. I encouraged weight loss and exercise.  Pulmonary nodules Likely benign, suggestive of calcified granulomas Continue low-dose screening  Health maintenance 05/27/2014-Pneumovax Up-to-date with flu  Plan/Recommendations: - Low-dose screening CT.  CC: Allegra Grana, FNP

## 2023-01-16 NOTE — Patient Instructions (Addendum)
I am glad you are stable with the breathing You can check with the pharmacy to see if they have any affordable inhalers based on your insurance CT scan does not show any suspicious lung nodules Follow-up in 1 year.

## 2023-01-24 ENCOUNTER — Ambulatory Visit: Payer: Medicaid Other | Admitting: Obstetrics and Gynecology

## 2023-01-25 ENCOUNTER — Encounter: Payer: Self-pay | Admitting: Family

## 2023-01-25 ENCOUNTER — Ambulatory Visit: Payer: Medicaid Other | Admitting: Family

## 2023-01-25 VITALS — BP 122/78 | HR 75 | Temp 97.8°F | Ht 66.5 in | Wt 170.8 lb

## 2023-01-25 DIAGNOSIS — L299 Pruritus, unspecified: Secondary | ICD-10-CM | POA: Diagnosis not present

## 2023-01-25 DIAGNOSIS — I700 Atherosclerosis of aorta: Secondary | ICD-10-CM

## 2023-01-25 DIAGNOSIS — N8110 Cystocele, unspecified: Secondary | ICD-10-CM | POA: Diagnosis not present

## 2023-01-25 DIAGNOSIS — E041 Nontoxic single thyroid nodule: Secondary | ICD-10-CM

## 2023-01-25 DIAGNOSIS — K219 Gastro-esophageal reflux disease without esophagitis: Secondary | ICD-10-CM

## 2023-01-25 MED ORDER — MOMETASONE FUROATE 0.1 % EX CREA: TOPICAL_CREAM | CUTANEOUS | 1 refills | Status: DC

## 2023-01-25 NOTE — Patient Instructions (Addendum)
I have placed a new referral to [facility] at [facility] Dr Lanetta Inch [phone]  Please call them to schedule  Please remind me EVERY May to order thyroid ultrasound.

## 2023-01-25 NOTE — Assessment & Plan Note (Signed)
Reviewed Dr. Oretha Milch note, GYN.  Patient would like second opinion.  I have placed a referral to [facility].

## 2023-01-25 NOTE — Assessment & Plan Note (Signed)
The 10-year ASCVD risk score (Matix Henshaw DK, et al., 2019) is: 2.8% Lab Results  Component Value Date   LDLCALC 118 (H) 11/19/2022   We discussed atherosclerosis seen on CT chest. Former smoker.  Overall she is low cardiovascular risk.  Patient continues to follow annually with cardiology, Dr Valentino Saxon, and she would like to discuss with him further restratification, statin therapy.  We did discuss starting statin today in setting of atherosclerosis.  She would like to consider this for risk reduction.

## 2023-01-25 NOTE — Progress Notes (Signed)
Assessment & Plan:  Cystocele, unspecified Assessment & Plan: Reviewed Dr. Oretha Harper note, GYN.  Patient would like second opinion.  I have placed a referral to [facility].   Orders: -     Ambulatory referral to Urogynecology  Gastroesophageal reflux disease, unspecified whether esophagitis present  Ear itching -     Mometasone Furoate; Use pea-sized amount in bilateral ear for itching as needed  Dispense: 15 g; Refill: 1  Atherosclerosis of aorta (HCC) Assessment & Plan: The 10-year ASCVD risk score (Megan Harper, et al., 2019) is: 2.8% Lab Results  Component Value Date   LDLCALC 118 (H) 11/19/2022   We discussed atherosclerosis seen on CT chest. Former smoker.  Overall she is low cardiovascular risk.  Patient continues to follow annually with cardiology, Dr Megan Harper, and she would like to discuss with him further restratification, statin therapy.  We did discuss starting statin today in setting of atherosclerosis.  She would like to consider this for risk reduction.    Thyroid nodule Assessment & Plan: 12/2022:  US thyroid:  1. Nodule 1 (TI-RADS 4), measuring 1.1 cm, located in the mid left thyroid lobe is unchanged in size since 12/29/2021 and still meets criteria for imaging follow-up. Annual ultrasound surveillance is recommended until 5 years of stability is documented. 2. No abnormality of the right thyroidectomy bed.  Reviewed this with patient today.  Patient will have repeat thyroid ultrasound in 1 year. Due 12/2023.       Return precautions given.   Risks, benefits, and alternatives of the medications and treatment plan prescribed today were discussed, and patient expressed understanding.   Education regarding symptom management and diagnosis given to patient on AVS either electronically or printed.  Return in about 3 months (around 04/27/2023).  Megan Plowman, FNP  Subjective:    Patient ID: Megan Harper, female    DOB: Jun 26, 1961, 62 y.o.   MRN:  [id number]  CC: Megan Harper is a 62 y.o. female who presents today for follow up.   HPI: Here today to discuss atherosclerosis as seen on CT chest and bladder prolapse, options.      She is following with Dr. Valentino Harper for cystocele with incomplete uterovaginal prolapse, last seen 12/26/2022.  Discussed possible pessary fitting if desired  She would like second opinion of surgical options.  Follow-up with pulmonology 01/16/2023 for COPD with chronic bronchitis and emphysema   B12 deficiency, she is B12 and 01/09/23 695  Transvaginal ultrasound 10/29/2022 1 cm uterine fundal fibroid.  Normal left ovary, right ovary cannot be notified. Ultrasound abdominal complete without gallstones, renal mass or hydronephrosis bilaterally  Cardiology consult 02/09/22 Dr Megan Harper for palpitations; she is making a follow up for this summer.    Palpitations are episodic and not sustained.  Palpitation associated with chest pain, shortness of breath and dizziness.   stress test , low risk 02/02/22  She request refill of steroid cream for her innner ear which itches   Allergies: Ceclor [cefaclor], Influenza vaccines, and Pantoprazole Current Outpatient Medications on File Prior to Visit  Medication Sig Dispense Refill   albuterol (VENTOLIN HFA) 108 (90 Base) MCG/ACT inhaler Inhale 2 puffs into the lungs every 6 (six) hours as needed for wheezing or shortness of breath. 18 g 3   loratadine (CLARITIN) 10 MG tablet Take 10 mg by mouth daily.     omeprazole (PRILOSEC) 20 MG capsule Take 1 capsule (20 mg total) by mouth daily as needed (for epigastric burning). 30 capsule 2  Budeson-Glycopyrrol-Formoterol (BREZTRI AEROSPHERE) 160-9-4.8 MCG/ACT AERO Inhale 2 puffs into the lungs in the morning and at bedtime. (Patient not taking: Reported on 01/16/2023) 10.7 g 5   No current facility-administered medications on file prior to visit.    Review of Systems  Constitutional:  Negative for chills and fever.   Respiratory:  Negative for cough.   Cardiovascular:  Negative for chest pain and palpitations.  Gastrointestinal:  Negative for nausea and vomiting.      Objective:    BP 122/78   Pulse 75   Temp 97.8 F (36.6 C) (Oral)   Ht 5' 6.5" (1.689 m)   Wt 170 lb 12.8 oz (77.5 kg)   SpO2 96%   BMI 27.15 kg/m  BP Readings from Last 3 Encounters:  01/25/23 122/78  01/16/23 122/70  12/26/22 (!) 117/56   Wt Readings from Last 3 Encounters:  01/25/23 170 lb 12.8 oz (77.5 kg)  01/16/23 170 lb (77.1 kg)  12/26/22 172 lb 1.6 oz (78.1 kg)    Physical Exam Vitals reviewed.  Constitutional:      Appearance: She is well-developed.  Eyes:     Conjunctiva/sclera: Conjunctivae normal.  Cardiovascular:     Rate and Rhythm: Normal rate and regular rhythm.     Pulses: Normal pulses.     Heart sounds: Normal heart sounds.  Pulmonary:     Effort: Pulmonary effort is normal.     Breath sounds: Normal breath sounds. No wheezing, rhonchi or rales.  Skin:    General: Skin is warm and dry.  Neurological:     Mental Status: She is alert.  Psychiatric:        Speech: Speech normal.        Behavior: Behavior normal.        Thought Content: Thought content normal.

## 2023-01-25 NOTE — Assessment & Plan Note (Signed)
12/2022:  US thyroid:  1. Nodule 1 (TI-RADS 4), measuring 1.1 cm, located in the mid left thyroid lobe is unchanged in size since 12/29/2021 and still meets criteria for imaging follow-up. Annual ultrasound surveillance is recommended until 5 years of stability is documented. 2. No abnormality of the right thyroidectomy bed.  Reviewed this with patient today.  Patient will have repeat thyroid ultrasound in 1 year. Due 12/2023.

## 2023-01-30 NOTE — Progress Notes (Unsigned)
    GYNECOLOGY PROGRESS NOTE  Subjective:    Patient ID: Megan Harper, female    DOB: 1961-06-10, 62 y.o.   MRN: [id number]  HPI  Patient is a 62 y.o. G35P2103 female who presents for follow up of uterine prolapse. She is her to discuss further management of cystocele.  Patient notes that after discussion of her options last visit she is still unsure of her desires.  States that she discussed with her PCP who recommended a second opinion.  Patient noted that this is what she desired, was referred to a urogynecologist.  Has an appointment next month.  In the meantime she decided to keep the appointment today to further discuss her options again as well.  Notes that she has concerns about each of the options that were mentioned to her but would like to further discuss risk versus benefits of each.  The following portions of the patient's history were reviewed and updated as appropriate: allergies, current medications, past family history, past medical history, past social history, past surgical history, and problem list.  Review of Systems Pertinent items are noted in HPI.   Objective:   Blood pressure (!) 116/56, pulse 65, resp. rate 16, height 5' 6.5" (1.689 m), weight 171 lb 3.2 oz (77.7 kg). Body mass index is 27.22 kg/m. General appearance: alert, cooperative, and no distress Pelvic: Normal external genitalia.  Vagina with mild atrophy.  Grade 2-3 cystocele with uterine descensus. Extremities: extremities normal, atraumatic, no cyanosis or edema Neurologic: Grossly normal   Assessment:   1. Cystocele with incomplete uterovaginal prolapse      Plan:   -Lengthy discussion had with patient again regarding management of her cystocele with uterine prolapse, including pessary, or surgical options of suspension versus hysterectomy with bladder tack.  Advised that certain surgeries could be performed by general GYN, however others may require surgical expertise so specialist such as urogyn.   Patient already has an appointment set up in July for second opinion, however notes that until that time she still desires some form of management.  I discussed proceeding with pessary fitting today if she desires more urgent intervention.  Patient okay to undergo fitting today after further discussion of the pessary itself.  Fitted for size 3 ring with support today.  Will order. -Will notify patient when pessary arrives for insertion.    A total of 25 minutes were spent face-to-face with the patient during this encounter and over half of that time involved counseling and coordination of care.   Hildred Laser, MD [facility]

## 2023-01-31 ENCOUNTER — Ambulatory Visit: Payer: Medicaid Other

## 2023-02-01 ENCOUNTER — Encounter: Payer: Self-pay | Admitting: Obstetrics and Gynecology

## 2023-02-01 ENCOUNTER — Ambulatory Visit (INDEPENDENT_AMBULATORY_CARE_PROVIDER_SITE_OTHER): Payer: Medicaid Other | Admitting: Obstetrics and Gynecology

## 2023-02-01 VITALS — BP 116/56 | HR 65 | Resp 16 | Ht 66.5 in | Wt 171.2 lb

## 2023-02-01 DIAGNOSIS — N812 Incomplete uterovaginal prolapse: Secondary | ICD-10-CM

## 2023-02-02 ENCOUNTER — Encounter: Payer: Self-pay | Admitting: Obstetrics and Gynecology

## 2023-02-02 NOTE — Patient Instructions (Signed)
How to Use a Vaginal Pessary  A vaginal pessary is a removable device that is placed into your vagina to support pelvic organs that droop. These organs include your uterus, bladder, and rectum. When your pelvic organs drop down into your vagina, it causes a condition called pelvic organ prolapse (POP). A pessary may be an alternative to surgery for women with POP. It may help women who leak urine when they strain or exercise (stress incontinence). This is a symptom of POP. A vaginal pessary may also be a temporary treatment for stress incontinence during pregnancy. There are several types of pessaries. All types are usually made of silicone. You can insert and remove some on your own. Other types must be inserted and removed by your health care provider at office visits. The reason you are using a pessary and the severity of your condition will determine which one is best for you. It is also important to find the right size. A pessary that is too small may fall out. A pessary that is too large may cause pain or discomfort. Your health care provider will do a physical exam to find the correct size and fit for your pessary. It may take several appointments to find the best fit for you. If you can be fit with the type of pessary that you can insert, remove, and clean yourself, your health care provider will teach you how to use your pessary at home. You may have checkups every few months. If you have the type of pessary that needs to be inserted and removed by your health care provider, you will have appointments every few months to have the pessary removed, cleaned, and replaced. What are the risks? When properly fitted and cared for, risks of using a vaginal pessary can be small. However, there can be problems that may include: Vaginal discharge. Vaginal bleeding. A bad smell coming from your vagina. Scraping of the skin inside your vagina. How to use your pessary Follow your health care provider's  instructions for using a pessary. These instructions may vary, depending on the type of pessary you have. To insert a pessary: Wash your hands with soap and water for at least 20 seconds. Squeeze or fold the pessary in half and lubricate the tip with a water-based lubricant. Insert the pessary into your vagina. It will unfold and provide support. To remove the pessary, gently tug it out of your vagina. You can remove the pessary every night or after several days. You can also remove it to have sex. How to care for your pessary If you have a pessary that you can remove: Clean your pessary with soap and water. Rinse well. Dry it completely before inserting it back into your vagina. Follow these instructions at home: Take over-the-counter and prescription medicines only as told by your health care provider. Your health care provider may prescribe an estrogen cream to moisten your vagina. Keep all follow-up visits. This is important. Contact a health care provider if: You feel any pain or discomfort when your pessary is in place. You continue to have stress incontinence. You have trouble keeping your pessary from falling out. You have an unusual vaginal discharge that is blood-tinged or smells bad. Summary A vaginal pessary is a removable device that is placed into your vagina to support pelvic organs that droop. This condition is called pelvic organ prolapse (POP). There are several types of pessaries. Some you can insert and remove on your own. Others must be inserted and   removed by your health care provider. The best type for you depends on the reason you are using a pessary and the severity of your condition. It is also important to find the right size. If you can use the type that you insert and remove on your own, your health care provider will teach you how to use it and schedule checkups every few months. If you have the type that needs to be inserted and removed by your health care  provider, you will have regular appointments to have your pessary removed, cleaned, and replaced. This information is not intended to replace advice given to you by your health care provider. Make sure you discuss any questions you have with your health care provider. Document Revised: 02/11/2020 Document Reviewed: 02/11/2020 Elsevier Patient Education  2024 [employer]  

## 2023-02-04 ENCOUNTER — Ambulatory Visit: Payer: Medicaid Other

## 2023-02-05 ENCOUNTER — Ambulatory Visit (INDEPENDENT_AMBULATORY_CARE_PROVIDER_SITE_OTHER): Payer: Medicaid Other

## 2023-02-05 DIAGNOSIS — E538 Deficiency of other specified B group vitamins: Secondary | ICD-10-CM

## 2023-02-05 MED ORDER — CYANOCOBALAMIN 1000 MCG/ML IJ SOLN
1000.0000 ug | Freq: Once | INTRAMUSCULAR | Status: AC
  Administered 2023-02-05: 1000 ug via INTRAMUSCULAR

## 2023-02-05 NOTE — Progress Notes (Signed)
After obtaining consent, and per orders of  Bethanie Dicker, NP, injection of B12 given IM in right deltoid by Valentino Nose. Patient tolerated injection well.

## 2023-02-12 ENCOUNTER — Ambulatory Visit: Payer: Medicaid Other | Admitting: Cardiology

## 2023-02-12 ENCOUNTER — Encounter: Payer: Self-pay | Admitting: Cardiology

## 2023-02-12 VITALS — BP 130/75 | HR 66 | Ht 66.5 in | Wt 172.0 lb

## 2023-02-12 DIAGNOSIS — R002 Palpitations: Secondary | ICD-10-CM | POA: Diagnosis not present

## 2023-02-12 DIAGNOSIS — I700 Atherosclerosis of aorta: Secondary | ICD-10-CM | POA: Diagnosis not present

## 2023-02-12 DIAGNOSIS — J449 Chronic obstructive pulmonary disease, unspecified: Secondary | ICD-10-CM | POA: Diagnosis not present

## 2023-02-12 DIAGNOSIS — R0602 Shortness of breath: Secondary | ICD-10-CM | POA: Diagnosis not present

## 2023-02-12 DIAGNOSIS — Z87891 Personal history of nicotine dependence: Secondary | ICD-10-CM

## 2023-02-12 MED ORDER — ASPIRIN 81 MG PO TBEC
81.0000 mg | DELAYED_RELEASE_TABLET | Freq: Every day | ORAL | 12 refills | Status: DC
Start: 2023-02-12 — End: 2023-03-05

## 2023-02-12 NOTE — Progress Notes (Signed)
ID:  Megan Harper, DOB 1960-08-28, MRN [id number]  PCP:  Allegra Grana, FNP  Cardiologist:  Tessa Lerner, DO, [facility] (established care Dec 26, 2021) Former Cardiology Providers: Dr. Yates Decamp  Date: 02/12/23 Last Office Visit: 02/09/2022  Chief Complaint  Patient presents with   Palpitations   Follow-up    HPI  Megan Harper is a 62 y.o. Caucasian female whose past medical history and cardiovascular risk factors include: Hx of smoking (78 pack hx, quit 2011), COPD, aortic atherosclerosis (07/26/2017).  Patient presents today for 1 year follow-up visit.  She has undergone cardiovascular workup including GXT and echocardiogram.  It was felt that majority of her dyspnea was due to pulmonary etiology given extensive history of smoking and and COPD and not on inhaler therapy since last office visit.  She presents today for 1 year follow-up visit.  Over the last 1 year she denies reoccurrence of palpitations.  Dyspnea is well-controlled currently as rescue inhaler.  Patient is noted to have aortic atherosclerosis mentioned on multiple imaging studies in the past.  Lipid profile reviewed.  Currently not on statin therapy.  Patient states that she is working with PCP with lifestyle changes and will have her lipids rechecked in the coming months.   FUNCTIONAL STATUS: No structured exercise program or daily routine.   ALLERGIES: Allergies  Allergen Reactions   Ceclor [Cefaclor] Other (See Comments)    Flu like symptoms   Influenza Vaccines Nausea And Vomiting    Fever, sweating eyes bloodshot nausea also   Pantoprazole Itching    MEDICATION LIST PRIOR TO VISIT: Current Meds  Medication Sig   albuterol (VENTOLIN HFA) 108 (90 Base) MCG/ACT inhaler Inhale 2 puffs into the lungs every 6 (six) hours as needed for wheezing or shortness of breath.   aspirin EC 81 MG tablet Take 1 tablet (81 mg total) by mouth daily. Swallow whole.   loratadine (CLARITIN) 10 MG tablet Take 10 mg by  mouth daily.   mometasone (ELOCON) 0.1 % cream Use pea-sized amount in bilateral ear for itching as needed   omeprazole (PRILOSEC) 20 MG capsule Take 1 capsule (20 mg total) by mouth daily as needed (for epigastric burning).     PAST MEDICAL HISTORY: Past Medical History:  Diagnosis Date   Abnormal SPEP    Anxiety    Arthralgia    Cervical dysplasia    COPD (chronic obstructive pulmonary disease) (HCC)    Emphysema of lung (HCC)    Endometriosis    Fibrocystic breast disease    GERD (gastroesophageal reflux disease)    Osteoporosis    Thyroid nodule    Vitamin D insufficiency     PAST SURGICAL HISTORY: Past Surgical History:  Procedure Laterality Date   laproscopy for endometriosis     THYROID LOBECTOMY Right 03/08/2010   TUBAL LIGATION  01/23/1986    FAMILY HISTORY: The patient family history includes Bladder Cancer in her sister; Colon cancer in her maternal grandfather; Diabetes in her paternal grandmother; Esophageal cancer in her maternal aunt; Heart disease in her maternal grandmother, maternal uncle, and paternal grandmother; Hypertension in her maternal grandfather and maternal grandmother; Kidney cancer in her maternal grandfather and maternal uncle; Lung cancer in her father; Lymphoma in her maternal aunt; Prostate cancer in her maternal grandfather and maternal uncle; Throat cancer in her maternal grandmother; Thyroid disease in her maternal grandmother; Uterine cancer in her mother.  SOCIAL HISTORY:  The patient  reports that she quit smoking about 13 years  ago. Her smoking use included cigarettes. She has a 77.50 pack-year smoking history. She has never used smokeless tobacco. She reports that she does not drink alcohol and does not use drugs.  REVIEW OF SYSTEMS: Review of Systems  Cardiovascular:  Positive for dyspnea on exertion (chronic  and stable). Negative for chest pain, claudication, leg swelling, orthopnea, paroxysmal nocturnal dyspnea and syncope.   Respiratory:  Positive for shortness of breath (chronic and stable).     PHYSICAL EXAM:    02/12/2023    3:38 PM 02/01/2023   11:01 AM 01/25/2023   11:10 AM  Vitals with BMI  Height 5' 6.5" 5' 6.5" 5' 6.5"  Weight 172 lbs 171 lbs 3 oz 170 lbs 13 oz  BMI 27.35 27.22 27.16  Systolic 130 116 191  Diastolic 75 56 78  Pulse 66 65 75    Physical Exam  Constitutional: No distress.  Age appropriate, hemodynamically stable.   Neck: No JVD present.  Cardiovascular: Normal rate, regular rhythm, S1 normal, S2 normal, intact distal pulses and normal pulses. Exam reveals no gallop, no S3 and no S4.  No murmur heard. Pulmonary/Chest: No stridor. She has no wheezes. She has no rales.  Decreased breath sounds bilaterally.  Abdominal: Soft. Bowel sounds are normal. She exhibits no distension. There is no abdominal tenderness.  Musculoskeletal:        General: No edema.     Cervical back: Neck supple.     Comments: Varicose veins.  Neurological: She is alert and oriented to person, place, and time. She has intact cranial nerves (2-12).  Skin: Skin is warm and moist.   Radiology:  CT CHEST LUNG CA SCREEN LOW DOSE W/O CM 12/03/2022 1. Lung-RADS 1, negative. Continue annual screening with low-dose chest CT without contrast in 12 months. 2. Aortic Atherosclerosis (ICD10-I70.0) and Emphysema (ICD10-J43.9).  CARDIAC DATABASE: EKG: February 12, 2023: Sinus rhythm, 62 bpm, left axis, left anterior fascicular block, without underlying ischemia or injury pattern.  Echocardiogram: Dec 27, 2015: LVEF 61%, trace MR, mild TR, no pulmonary hypertension.  12/27/2021: Left ventricle cavity is normal in size and wall thickness. Normal global wall motion. Normal LV systolic function with EF 59%. Normal diastolic filling pattern.  No significant valvular abnormality. Normal right atrial pressure.  No significant change compared to previous study in 2017.   Stress Testing: Exercise treadmill stress test  02/02/2022: Exercise treadmill stress test performed using Bruce protocol. Patient reached 7 METS, and 97% of age predicted maximum heart rate. Exercise capacity was low. No chest pain reported. Normal heart rate and hemodynamic response. Stress EKG revealed no ischemic changes. Low risk study.  Heart Catheterization: None  Cardiopulmonary exercise test: 01/02/2016: Exercise testing with gas exchange demonstrates normal functional capacity when compared to matched sedentary Norms.There is no cardiac limitation noted.  At peak exercise, patient appears limited due to her body habitus with related restrictive lung physiology and mild obstruction. Would recommend continued follow-up with Pulmonary as well as weight loss.   LABORATORY DATA:    Latest Ref Rng & Units 11/19/2022   10:17 AM 10/25/2022    2:06 PM 04/04/2022    3:06 PM  CBC  WBC 4.0 - 10.5 K/uL 5.2  6.1  6.1   Hemoglobin 12.0 - 15.0 g/dL 47.8  29.5  62.1   Hematocrit 36.0 - 46.0 % 41.0  42.9  39.9   Platelets 150.0 - 400.0 K/uL 281.0  285  274        Latest Ref Rng &  Units 11/19/2022   10:17 AM 10/25/2022    2:06 PM 04/04/2022    3:06 PM  CMP  Glucose 70 - 99 mg/dL 91  96  102   BUN 6 - 23 mg/dL 12  10  11    Creatinine 0.40 - 1.20 mg/dL 7.25  3.66  4.40   Sodium 135 - 145 mEq/L 139  138  138   Potassium 3.5 - 5.1 mEq/L 4.1  3.6  3.5   Chloride 96 - 112 mEq/L 100  102  103   CO2 19 - 32 mEq/L 31  27  28    Calcium 8.4 - 10.5 mg/dL 9.4  8.9  9.0   Total Protein 6.0 - 8.3 g/dL 7.0  7.9  7.5   Total Bilirubin 0.2 - 1.2 mg/dL 0.7  0.7  0.4   Alkaline Phos 39 - 117 U/L 78  79  74   AST 0 - 37 U/L 16  21  18    ALT 0 - 35 U/L 14  20  17      Lipid Panel  Lab Results  Component Value Date   CHOL 187 11/19/2022   HDL 57.70 11/19/2022   LDLCALC 118 (H) 11/19/2022   TRIG 56.0 11/19/2022   CHOLHDL 3 11/19/2022   No components found for: "NTPROBNP" No results for input(s): "PROBNP" in the last 8760 hours. Recent Labs     11/19/22 1017  TSH 4.14    BMP Recent Labs    04/04/22 1506 10/25/22 1406 11/19/22 1017  NA 138 138 139  K 3.5 3.6 4.1  CL 103 102 100  CO2 28 27 31   GLUCOSE 106* 96 91  BUN 11 10 12   CREATININE 0.69 0.64 0.75  CALCIUM 9.0 8.9 9.4  GFRNONAA >60 >60  --     HEMOGLOBIN A1C Lab Results  Component Value Date   HGBA1C 5.7 11/19/2022    IMPRESSION:    ICD-10-CM   1. Atherosclerosis of aorta (HCC)  I70.0 aspirin EC 81 MG tablet    2. Palpitations  R00.2 EKG 12-Lead    3. Shortness of breath  R06.02     4. Chronic obstructive pulmonary disease, unspecified COPD type (HCC)  J44.9     5. Former smoker  Z87.891         RECOMMENDATIONS: Megan Harper is a 62 y.o. Caucasian female whose past medical history and cardiac risk factors include: Hx of smoking (78 pack hx, quit 2011), COPD, aortic atherosclerosis.  Atherosclerosis of aorta (HCC) Incidental finding noted on multiple imaging studies in the past Most recent lipid profile reviewed. 10-year risk of ASCVD based on current parameter is less than 5%. Currently working on lifestyle changes with PCP prior to considering pharmacological therapy.  Scheduled for September 2024. If pharmacological therapy is warranted would recommend low intensity statin. We discussed the risks, benefits, and alternatives of aspirin 81 mg p.o. daily patient would like to start it.  Palpitations Not since last office visit. No additional workup warranted at this time  Shortness of breath Chronic obstructive pulmonary disease, unspecified COPD type [facility]) Former smoker Has undergone appropriate ischemic workup as outlined above. EKG is nonischemic. Follows up with Dr. Isaiah Serge from pulmonary medicine. Currently on rescue inhalers if needed. Monitor for now.  Patient has varicose veins in the bilateral lower extremity swelling.  I have asked her to discuss this with PCP and consider consult to vascular and vein to see if she is a  candidate for ablation.  Currently not  actively managing any of her comorbid conditions.  I will be more than happy to see her on an annual basis or as needed.  If she prefers as needed basis I have strongly encouraged her to follow-up with PCP as recommended.  FINAL MEDICATION LIST END OF ENCOUNTER: Meds ordered this encounter  Medications   aspirin EC 81 MG tablet    Sig: Take 1 tablet (81 mg total) by mouth daily. Swallow whole.    Dispense:  30 tablet    Refill:  12    There are no discontinued medications.    Current Outpatient Medications:    albuterol (VENTOLIN HFA) 108 (90 Base) MCG/ACT inhaler, Inhale 2 puffs into the lungs every 6 (six) hours as needed for wheezing or shortness of breath., Disp: 18 g, Rfl: 3   aspirin EC 81 MG tablet, Take 1 tablet (81 mg total) by mouth daily. Swallow whole., Disp: 30 tablet, Rfl: 12   loratadine (CLARITIN) 10 MG tablet, Take 10 mg by mouth daily., Disp: , Rfl:    mometasone (ELOCON) 0.1 % cream, Use pea-sized amount in bilateral ear for itching as needed, Disp: 15 g, Rfl: 1   omeprazole (PRILOSEC) 20 MG capsule, Take 1 capsule (20 mg total) by mouth daily as needed (for epigastric burning)., Disp: 30 capsule, Rfl: 2  Orders Placed This Encounter  Procedures   EKG 12-Lead   There are no Patient Instructions on file for this visit.   --Continue cardiac medications as reconciled in final medication list. --Return in about 1 year (around 02/12/2024) for Annual follow up visit. Or sooner if needed. --Continue follow-up with your primary care physician regarding the management of your other chronic comorbid conditions.  Patient's questions and concerns were addressed to her satisfaction. She voices understanding of the instructions provided during this encounter.   This note was created using a voice recognition software as a result there may be grammatical errors inadvertently enclosed that do not reflect the nature of this encounter. Every  attempt is made to correct such errors.  Tessa Lerner, [place], [facility]  Pager:  [phone] Office: [phone]

## 2023-02-21 ENCOUNTER — Telehealth: Payer: Self-pay

## 2023-02-21 NOTE — Telephone Encounter (Signed)
Patient called today to check status of pessary. I informed her that the pessary was ordered on 06/18 and has not come in. I also explained to her that once we got it, we will call and get her scheduled. She verbalized understanding.

## 2023-02-22 ENCOUNTER — Ambulatory Visit: Payer: Medicaid Other | Admitting: Family

## 2023-02-22 ENCOUNTER — Encounter: Payer: Self-pay | Admitting: Family

## 2023-02-22 VITALS — BP 136/82 | HR 63 | Temp 97.3°F | Ht 66.5 in | Wt 170.0 lb

## 2023-02-22 DIAGNOSIS — M7989 Other specified soft tissue disorders: Secondary | ICD-10-CM

## 2023-02-22 DIAGNOSIS — R5383 Other fatigue: Secondary | ICD-10-CM

## 2023-02-22 NOTE — Assessment & Plan Note (Addendum)
Sleep is not restorative;  I strongly reiterated the importance of screening for sleep apnea.  Counseled patient on the risk of cardiac arrhythmias, increased risk of stroke heart attack in the setting of untreated sleep apnea.  She politely declines at this time.  We discussed fatigue as being a symptom of sleep apnea.  Encouraged continued exercise.  We discussed the importance of health maintenance being up-to-date including CT lung cancer screening, mammogram, colonoscopy and Pap smear

## 2023-02-22 NOTE — Progress Notes (Signed)
Assessment & Plan:  Leg swelling Assessment & Plan: Episodic.  No edema on exam today.  Varicosities present and  appear to be painful at times.  Pending ferritin to evaluate for restless leg syndrome.  Referral to [place] [facility] further evaluation varicosities and treatment thereof.  Encouraged compression stockings as needed for leg swelling.  Orders: -     Ambulatory referral to Vascular Surgery -     Iron, TIBC and Ferritin Panel -     VITAMIN D 25 Hydroxy (Vit-D Deficiency, Fractures) -     TSH  Other fatigue Assessment & Plan: Sleep is not restorative;  I strongly reiterated the importance of screening for sleep apnea.  Counseled patient on the risk of cardiac arrhythmias, increased risk of stroke heart attack in the setting of untreated sleep apnea.  She politely declines at this time.  We discussed fatigue as being a symptom of sleep apnea.  Encouraged continued exercise.  We discussed the importance of health maintenance being up-to-date including CT lung cancer screening, mammogram, colonoscopy and Pap smear  Orders: -     Iron, TIBC and Ferritin Panel -     VITAMIN D 25 Hydroxy (Vit-D Deficiency, Fractures) -     TSH     Return precautions given.   Risks, benefits, and alternatives of the medications and treatment plan prescribed today were discussed, and patient expressed understanding.   Education regarding symptom management and diagnosis given to patient on AVS either electronically or printed.  Return in about 3 months (around 05/25/2023).  Rennie Plowman, FNP  Subjective:    Patient ID: Megan Harper, female    DOB: [birth year], 62 y.o.   MRN: [id number]  CC: Megan Harper is a 62 y.o. female who presents today for follow up.   HPI: She complains of episode trace bilateral lower extremity swelling around her ankles.    No calf pain with walking, numbness in legs.  She has tried compression stockings in the past.  She is concerned with  varicose veins.   Legs feel heavy. Legs are not moving but she can feel restless at night.  Patient would like a referral to vein and vascular clinic.  Sleep is not restorative. She [place], walks dogs daily. Energy improves with exercise.   Consult with cardiology 02/12/2023, Dr Odis Hollingshead which aortic atherosclerosis was discussed. ASCVD less 5%.   Allergies: Ceclor [cefaclor], Influenza vaccines, and Pantoprazole Current Outpatient Medications on File Prior to Visit  Medication Sig Dispense Refill   albuterol (VENTOLIN HFA) 108 (90 Base) MCG/ACT inhaler Inhale 2 puffs into the lungs every 6 (six) hours as needed for wheezing or shortness of breath. 18 g 3   loratadine (CLARITIN) 10 MG tablet Take 10 mg by mouth daily.     mometasone (ELOCON) 0.1 % cream Use pea-sized amount in bilateral ear for itching as needed 15 g 1   omeprazole (PRILOSEC) 20 MG capsule Take 1 capsule (20 mg total) by mouth daily as needed (for epigastric burning). 30 capsule 2   aspirin EC 81 MG tablet Take 1 tablet (81 mg total) by mouth daily. Swallow whole. (Patient not taking: Reported on 02/22/2023) 30 tablet 12   No current facility-administered medications on file prior to visit.    Review of Systems  Constitutional:  Negative for chills and fever.  Respiratory:  Negative for cough.   Cardiovascular:  Positive for leg swelling. Negative for chest pain and palpitations.  Gastrointestinal:  Negative for nausea and vomiting.  Objective:    BP 136/82   Pulse 63   Temp (!) 97.3 F (36.3 C) (Temporal)   Ht 5' 6.5" (1.689 m)   Wt 170 lb (77.1 kg)   SpO2 97%   BMI 27.03 kg/m  BP Readings from Last 3 Encounters:  02/22/23 136/82  02/12/23 130/75  02/01/23 (!) 116/56   Wt Readings from Last 3 Encounters:  02/22/23 170 lb (77.1 kg)  02/12/23 172 lb (78 kg)  02/01/23 171 lb 3.2 oz (77.7 kg)    Physical Exam Vitals reviewed.  Constitutional:      Appearance: She is well-developed.  Eyes:      Conjunctiva/sclera: Conjunctivae normal.  Cardiovascular:     Rate and Rhythm: Normal rate and regular rhythm.     Pulses: Normal pulses.     Heart sounds: Normal heart sounds.     Comments: No LE edema, palpable cords or masses. No erythema or increased warmth. No asymmetry in calf size when compared bilaterally LE hair growth symmetric and present. No discoloration.    varicosities noted. LE warm and palpable pedal pulses.  Pulmonary:     Effort: Pulmonary effort is normal.     Breath sounds: Normal breath sounds. No wheezing, rhonchi or rales.  Musculoskeletal:     Right lower leg: No edema.     Left lower leg: No edema.  Skin:    General: Skin is warm and dry.  Neurological:     Mental Status: She is alert.  Psychiatric:        Speech: Speech normal.        Behavior: Behavior normal.        Thought Content: Thought content normal.

## 2023-02-22 NOTE — Assessment & Plan Note (Signed)
Episodic.  No edema on exam today.  Varicosities present and  appear to be painful at times.  Pending ferritin to evaluate for restless leg syndrome.  Referral to [place] [facility] and vascular further evaluation varicosities and treatment thereof.  Encouraged compression stockings as needed for leg swelling.

## 2023-02-22 NOTE — Patient Instructions (Addendum)
Please consider sleep study for evaluation of sleep apnea   Referral to vascular surgery  Let us know if you dont hear back within a week in regards to an appointment being scheduled.   So that you are aware, if you are [facility] MyChart user , please pay attention to your MyChart messages as you may receive a MyChart message with a phone number to call and schedule this test/appointment own your own from our referral coordinator. This is a new process so I do not want you to miss this message.  If you are not a MyChart user, you will receive a phone call.

## 2023-02-23 LAB — IRON,TIBC AND FERRITIN PANEL
%SAT: 31 % (calc) (ref 16–45)
Ferritin: 76 ng/mL (ref 16–232)
Iron: 103 ug/dL (ref 45–160)
TIBC: 332 mcg/dL (calc) (ref 250–450)

## 2023-02-23 LAB — VITAMIN D 25 HYDROXY (VIT D DEFICIENCY, FRACTURES): Vit D, 25-Hydroxy: 44 ng/mL (ref 30–100)

## 2023-02-23 LAB — TSH: TSH: 1.79 mIU/L (ref 0.40–4.50)

## 2023-02-24 ENCOUNTER — Other Ambulatory Visit: Payer: Self-pay | Admitting: Family

## 2023-02-24 DIAGNOSIS — K219 Gastro-esophageal reflux disease without esophagitis: Secondary | ICD-10-CM

## 2023-02-25 NOTE — Telephone Encounter (Signed)
I contacted the patient via phone, I have her schedule for 7/9 at 9:35 am with Dr Hildred Laser.

## 2023-02-26 ENCOUNTER — Ambulatory Visit
Admission: RE | Admit: 2023-02-26 | Discharge: 2023-02-26 | Disposition: A | Discharge status: Home / Self Care | Payer: Medicaid Other | Source: Ambulatory Visit | Attending: Family | Admitting: Family

## 2023-02-26 DIAGNOSIS — Z1231 Encounter for screening mammogram for malignant neoplasm of breast: Secondary | ICD-10-CM | POA: Insufficient documentation

## 2023-03-05 ENCOUNTER — Encounter: Payer: Self-pay | Admitting: Obstetrics and Gynecology

## 2023-03-05 ENCOUNTER — Ambulatory Visit (INDEPENDENT_AMBULATORY_CARE_PROVIDER_SITE_OTHER): Payer: Medicaid Other | Admitting: Obstetrics and Gynecology

## 2023-03-05 VITALS — BP 107/58 | HR 62 | Resp 16 | Ht 66.5 in | Wt 170.0 lb

## 2023-03-05 DIAGNOSIS — N812 Incomplete uterovaginal prolapse: Secondary | ICD-10-CM | POA: Diagnosis not present

## 2023-03-05 MED ORDER — TRIMO-SAN 0.025-0.01 % VA GEL: 1.0000 | VAGINAL | 3 refills | Status: AC

## 2023-03-05 NOTE — Progress Notes (Signed)
    GYNECOLOGY PROGRESS NOTE  Subjective:    Patient ID: Megan Harper, female    DOB: 1960-09-29, 62 y.o.   MRN: [id number]  HPI  Patient is a 62 y.o. G5P2103 female who presents for pessary insertion. Patient has a cystocele. Options for management were discussed at previous visit and she chose to go with pessary. She was fitted for a size 3 ring with support and is here to have it placed today.  The following portions of the patient's history were reviewed and updated as appropriate: allergies, current medications, past family history, past medical history, past social history, past surgical history, and problem list.  Review of Systems Pertinent items are noted in HPI.   Objective:   Blood pressure (!) 107/58, pulse 62, resp. rate 16, height 5' 6.5" (1.689 m), weight 170 lb (77.1 kg).  Body mass index is 27.03 kg/m. General appearance: alert, cooperative, and no distress Pelvic exam deferred. Size 3 ring with support pessary placed today.    Assessment:   1. Cystocele with incomplete uterovaginal prolapse      Plan:   Pessary inserted today. To f/u in 2-3 weeks to reassess symptoms. Prescribed Trimo-San gel for weekly pessary maintenance.    Hildred Laser, MD [facility]

## 2023-03-07 ENCOUNTER — Encounter: Payer: Self-pay | Admitting: Family

## 2023-03-07 ENCOUNTER — Ambulatory Visit (INDEPENDENT_AMBULATORY_CARE_PROVIDER_SITE_OTHER): Payer: Medicaid Other

## 2023-03-07 DIAGNOSIS — E538 Deficiency of other specified B group vitamins: Secondary | ICD-10-CM | POA: Diagnosis not present

## 2023-03-07 MED ORDER — CYANOCOBALAMIN 1000 MCG/ML IJ SOLN
1000.0000 ug | Freq: Once | INTRAMUSCULAR | Status: AC
Start: 2023-03-07 — End: 2023-03-07
  Administered 2023-03-07: 1000 ug via INTRAMUSCULAR

## 2023-03-07 NOTE — Progress Notes (Signed)
Patient arrived for a B12 injection and it was administered into her Left deltoid. Patient tolerated the B12 injection well and did not show any signs of distress or voice any concerns.

## 2023-03-18 ENCOUNTER — Ambulatory Visit: Payer: Medicaid Other | Admitting: Obstetrics and Gynecology

## 2023-03-21 ENCOUNTER — Ambulatory Visit (INDEPENDENT_AMBULATORY_CARE_PROVIDER_SITE_OTHER): Payer: Medicaid Other | Admitting: Obstetrics and Gynecology

## 2023-03-21 ENCOUNTER — Encounter: Payer: Self-pay | Admitting: Obstetrics and Gynecology

## 2023-03-21 VITALS — BP 116/62 | HR 64 | Resp 16 | Ht 66.5 in | Wt 170.6 lb

## 2023-03-21 DIAGNOSIS — Z4689 Encounter for fitting and adjustment of other specified devices: Secondary | ICD-10-CM

## 2023-03-21 DIAGNOSIS — N812 Incomplete uterovaginal prolapse: Secondary | ICD-10-CM

## 2023-03-21 DIAGNOSIS — N3941 Urge incontinence: Secondary | ICD-10-CM

## 2023-03-21 NOTE — Progress Notes (Signed)
    GYNECOLOGY PROGRESS NOTE  Subjective:    Patient ID: Megan Harper, female    DOB: 03/20/1961, 62 y.o.   MRN: [id number]  HPI  Patient is a 62 y.o. G34P2103 female who presents for a pessary check. Patient has a cystocele. She has been fitted with a size 3 ring with support. Today she notes that she has been having a little bit more bladder pressure than she had before. Some leakage if she does not go to the bathroom when she has the urge.  The following portions of the patient's history were reviewed and updated as appropriate: allergies, current medications, past family history, past medical history, past social history, past surgical history, and problem list.  Review of Systems Pertinent items noted in HPI and remainder of comprehensive ROS otherwise negative.   Objective:   Blood pressure 116/62, pulse 64, resp. rate 16, height 5' 6.5" (1.689 m), weight 170 lb 9.6 oz (77.4 kg). Body mass index is 27.12 kg/m. General appearance: alert and no distress Pelvis: external genitalia normal, internal exam not performed.    Assessment:   1. Cystocele with incomplete uterovaginal prolapse   2. Pessary maintenance   3. Urge incontinence      Plan:   Currently with pessary in place. Assisted patient with self insertion and removal today. Pessary cleaned and reinserted.  Discussed pessary maintenance with weekly cleansing, or can leave in longer with use of Trimo-San gel weekly.  New onset of urge incontinence with use of the pessary. Discussed behavioral modifications, Kegel exercise, and if no improvement, can consider use of OAB medications.    A total of 15 minutes were spent face-to-face with the patient during this encounter and over half of that time involved counseling and pessary education.  Hildred Laser, MD [facility]

## 2023-03-21 NOTE — Patient Instructions (Signed)
How to Use a Vaginal Pessary  A vaginal pessary is a removable device that is placed into your vagina to support pelvic organs that droop. These organs include your uterus, bladder, and rectum. When your pelvic organs drop down into your vagina, it causes a condition called pelvic organ prolapse (POP). A pessary may be an alternative to surgery for women with POP. It may help women who leak urine when they strain or exercise (stress incontinence). This is a symptom of POP. A vaginal pessary may also be a temporary treatment for stress incontinence during pregnancy. There are several types of pessaries. All types are usually made of silicone. You can insert and remove some on your own. Other types must be inserted and removed by your health care provider at office visits. The reason you are using a pessary and the severity of your condition will determine which one is best for you. It is also important to find the right size. A pessary that is too small may fall out. A pessary that is too large may cause pain or discomfort. Your health care provider will do a physical exam to find the correct size and fit for your pessary. It may take several appointments to find the best fit for you. If you can be fit with the type of pessary that you can insert, remove, and clean yourself, your health care provider will teach you how to use your pessary at home. You may have checkups every few months. If you have the type of pessary that needs to be inserted and removed by your health care provider, you will have appointments every few months to have the pessary removed, cleaned, and replaced. What are the risks? When properly fitted and cared for, risks of using a vaginal pessary can be small. However, there can be problems that may include: Vaginal discharge. Vaginal bleeding. A bad smell coming from your vagina. Scraping of the skin inside your vagina. How to use your pessary Follow your health care provider's  instructions for using a pessary. These instructions may vary, depending on the type of pessary you have. To insert a pessary: Wash your hands with soap and water for at least 20 seconds. Squeeze or fold the pessary in half and lubricate the tip with a water-based lubricant. Insert the pessary into your vagina. It will unfold and provide support. To remove the pessary, gently tug it out of your vagina. You can remove the pessary every night or after several days. You can also remove it to have sex. How to care for your pessary If you have a pessary that you can remove: Clean your pessary with soap and water. Rinse well. Dry it completely before inserting it back into your vagina. Follow these instructions at home: Take over-the-counter and prescription medicines only as told by your health care provider. Your health care provider may prescribe an estrogen cream to moisten your vagina. Keep all follow-up visits. This is important. Contact a health care provider if: You feel any pain or discomfort when your pessary is in place. You continue to have stress incontinence. You have trouble keeping your pessary from falling out. You have an unusual vaginal discharge that is blood-tinged or smells bad. Summary A vaginal pessary is a removable device that is placed into your vagina to support pelvic organs that droop. This condition is called pelvic organ prolapse (POP). There are several types of pessaries. Some you can insert and remove on your own. Others must be inserted and  removed by your health care provider. The best type for you depends on the reason you are using a pessary and the severity of your condition. It is also important to find the right size. If you can use the type that you insert and remove on your own, your health care provider will teach you how to use it and schedule checkups every few months. If you have the type that needs to be inserted and removed by your health care  provider, you will have regular appointments to have your pessary removed, cleaned, and replaced. This information is not intended to replace advice given to you by your health care provider. Make sure you discuss any questions you have with your health care provider. Document Revised: 02/11/2020 Document Reviewed: 02/11/2020 Elsevier Patient Education  2024 [employer].

## 2023-03-23 DIAGNOSIS — N3941 Urge incontinence: Secondary | ICD-10-CM | POA: Insufficient documentation

## 2023-04-02 ENCOUNTER — Encounter: Payer: Self-pay | Admitting: Obstetrics and Gynecology

## 2023-04-02 ENCOUNTER — Ambulatory Visit (INDEPENDENT_AMBULATORY_CARE_PROVIDER_SITE_OTHER): Payer: Medicaid Other | Admitting: Obstetrics and Gynecology

## 2023-04-02 VITALS — BP 109/67 | HR 68

## 2023-04-02 DIAGNOSIS — N816 Rectocele: Secondary | ICD-10-CM

## 2023-04-02 DIAGNOSIS — N952 Postmenopausal atrophic vaginitis: Secondary | ICD-10-CM

## 2023-04-02 DIAGNOSIS — M62838 Other muscle spasm: Secondary | ICD-10-CM

## 2023-04-02 DIAGNOSIS — R350 Frequency of micturition: Secondary | ICD-10-CM

## 2023-04-02 DIAGNOSIS — N393 Stress incontinence (female) (male): Secondary | ICD-10-CM | POA: Diagnosis not present

## 2023-04-02 DIAGNOSIS — N8110 Cystocele, unspecified: Secondary | ICD-10-CM

## 2023-04-02 DIAGNOSIS — N814 Uterovaginal prolapse, unspecified: Secondary | ICD-10-CM | POA: Diagnosis not present

## 2023-04-02 DIAGNOSIS — N3941 Urge incontinence: Secondary | ICD-10-CM

## 2023-04-02 MED ORDER — ESTRADIOL 0.1 MG/GM VA CREA
0.5000 g | TOPICAL_CREAM | VAGINAL | 11 refills | Status: DC
Start: 2023-04-04 — End: 2023-06-26

## 2023-04-02 NOTE — Patient Instructions (Signed)
Start vaginal estrogen cream x2 weekly. You can use the Trimosan jelly in between if you would like. Use a blueberry sized amount into the vagina and around the clitoris and urethra.   Consider surgical options.   You did leak urine on exam today. This is called stress incontinence which you can decide between a mesh sling or urethral bulking if you were to decide you wanted a surgical repair.   Return for pessary fitting

## 2023-04-02 NOTE — Progress Notes (Unsigned)
[facility] New Patient Evaluation and Consultation  Referring Provider: Allegra Grana, FNP PCP: Allegra Grana, FNP Date of Service: 04/02/2023  SUBJECTIVE Chief Complaint: New Patient (Initial Visit) Megan Harper is a 62 y.o. female is here for cystocele.)  History of Present Illness: Megan Harper is a 62 y.o. White or Caucasian female seen in consultation at the request of Dr. Jason Coop for evaluation of Prolapse.    Review of records significant for:  Dr. Valentino Saxon placed a #3 ring with support for patient's prolapse  A1c 5.7 (11/19/22)   TRANSABDOMINAL AND TRANSVAGINAL ULTRASOUND OF PELVIS (10/29/22)   TECHNIQUE: Both transabdominal and transvaginal ultrasound examinations of the pelvis were performed. Transabdominal technique was performed for global imaging of the pelvis including uterus, ovaries, adnexal regions, and pelvic cul-de-sac. It was necessary to proceed with endovaginal exam following the transabdominal exam to visualize the left ovary.   COMPARISON:  Abdomen ultrasound the same day reported separately. CT Abdomen and Pelvis 11/23/2021.   FINDINGS: Uterus   Measurements: 5.8 x 2.5 by 4.6 cm = volume: 47 mL. Small subserosal hypoechoic fibroid suspected on transvaginal images (series 1001, image 12), about 1 cm. Otherwise negative myometrium.   Endometrium   Thickness: 5 mm.  No focal abnormality visualized.   Right ovary   Measurements: Could not be identified despite transabdominal and transvaginal attempts. Right ovary was diminutive, unremarkable by CT last year (series 2, image 66 of that exam).   Left ovary   Measurements: 2.3 x 1.3 x 2.3 cm = volume: 4 mL. Normal appearance/no adnexal mass. Maintained color Doppler vascularity.   Other findings   No pelvis free fluid.   Unremarkable urinary bladder. Pre and postvoid bladder volumes also were measured on the abdomen ultrasound the same day reported separately.    IMPRESSION: 1. Small 1 cm subserosal uterine fundal fibroid. Otherwise normal uterus. 2. Normal left ovary. Right ovary could not be identified, but was unremarkable by CT last year. 3. Unremarkable urinary bladder. Pre and postvoid bladder volumes are reported on Abdomen Ultrasound separately. Urinary Symptoms: Leaks urine with with a full bladder and with movement to the bathroom Leaks occasional  Pad use: None  She is not bothered by her UI symptoms.  Day time voids 6-8.  Nocturia: 1-2 times per night to void. Voiding dysfunction: she empties her bladder well.  does not use a catheter to empty bladder.  When urinating, she feels she has no difficulties Drinks: 8oz Coffee, 32oz Water, 8oz Soda, Occasional OJ per day  UTIs: 0 UTI's in the last year.   Denies history of blood in urine, kidney or bladder stones, pyelonephritis, bladder cancer, and kidney cancer  Pelvic Organ Prolapse Symptoms:                  She Admits to a feeling of a bulge the vaginal area. It has been present for 9 months.  She Admits to seeing a bulge.  This bulge is bothersome.  Bowel Symptom: Bowel movements: 2-4 time(s) per week Stool consistency: soft  Straining: yes.  Splinting: yes.  Incomplete evacuation: yes.  She Denies accidental bowel leakage / fecal incontinence Bowel regimen: none Last colonoscopy: Date 2016, Results Polyps  Sexual Function Sexually active: yes.  Sexual orientation: Straight Pain with sex: has discomfort due to prolapse  Pelvic Pain Denies pelvic pain    Past Medical History:  Past Medical History:  Diagnosis Date   Abnormal SPEP    Anxiety  Arthralgia    Cervical dysplasia    COPD (chronic obstructive pulmonary disease) (HCC)    Emphysema of lung (HCC)    Endometriosis    Fibrocystic breast disease    GERD (gastroesophageal reflux disease)    Osteoporosis    Thyroid nodule    Vitamin D insufficiency      Past Surgical History:   Past Surgical  History:  Procedure Laterality Date   laproscopy for endometriosis     THYROID LOBECTOMY Right 03/08/2010   TUBAL LIGATION  01/23/1986     Past OB/GYN History: G3 P3 Vaginal deliveries: 2,  Forceps/ Vacuum deliveries: 0, Cesarean section: 1 Menopausal: Yes, at age 44 Last pap smear was 02/2022.  Any history of abnormal pap smears: yes.   Medications: She has a current medication list which includes the following prescription(s): albuterol, [START ON 04/04/2023] estradiol, loratadine, mometasone, omeprazole, and trimo-san.   Allergies: Patient is allergic to ceclor [cefaclor], influenza vaccines, and pantoprazole.   Social History:  Social History   Tobacco Use   Smoking status: Former    Current packs/day: 0.00    Average packs/day: 2.5 packs/day for 31.0 years (77.5 ttl pk-yrs)    Types: Cigarettes    Start date: 09/17/1978    Quit date: 09/17/2009    Years since quitting: 13.5   Smokeless tobacco: Never  Vaping Use   Vaping status: Never Used  Substance Use Topics   Alcohol use: No   Drug use: No    Relationship status: married She lives with spouse.   She is not employed. Regular exercise: Yes: walking History of abuse: No  Family History:   Family History  Problem Relation Age of Onset   Uterine cancer Mother    Lung cancer Father        smoked   Bladder Cancer Sister    Esophageal cancer Maternal Aunt    Lymphoma Maternal Aunt    Kidney cancer Maternal Uncle    Prostate cancer Maternal Uncle    Heart disease Maternal Uncle    Heart disease Maternal Grandmother    Hypertension Maternal Grandmother    Thyroid disease Maternal Grandmother    Throat cancer Maternal Grandmother    Prostate cancer Maternal Grandfather    Hypertension Maternal Grandfather    Colon cancer Maternal Grandfather    Kidney cancer Maternal Grandfather    Heart disease Paternal Grandmother    Diabetes Paternal Grandmother    Breast cancer Neg Hx      Review of Systems: Review of  Systems  Constitutional:  Negative for chills, fever and malaise/fatigue.  Respiratory:  Positive for shortness of breath. Negative for cough and hemoptysis.   Cardiovascular:  Negative for chest pain, palpitations and leg swelling.  Gastrointestinal:  Positive for abdominal pain. Negative for blood in stool and constipation.  Genitourinary:  Positive for frequency and urgency. Negative for dysuria.  Musculoskeletal:  Negative for myalgias.  Neurological:  Negative for dizziness, seizures, weakness and headaches.  Endo/Heme/Allergies:  Does not bruise/bleed easily.  Psychiatric/Behavioral:  Negative for depression and suicidal ideas. The patient is not nervous/anxious.      OBJECTIVE Physical Exam: Vitals:   04/02/23 1326  BP: 109/67  Pulse: 68    Physical Exam Constitutional:      Appearance: Normal appearance.  Pulmonary:     Effort: Pulmonary effort is normal.  Abdominal:     General: Abdomen is flat.     Palpations: Abdomen is soft.  Skin:    General: Skin is  warm and dry.  Neurological:     Mental Status: She is alert.  Psychiatric:        Mood and Affect: Mood normal.        Behavior: Behavior normal.        Thought Content: Thought content normal.        Judgment: Judgment normal.     GU / Detailed Urogynecologic Evaluation:  Pelvic Exam: Normal external female genitalia; Bartholin's and Skene's glands normal in appearance; urethral meatus normal in appearance, no urethral masses or discharge.   CST: positive  Speculum exam reveals normal vaginal mucosa with atrophy. Cervix normal appearance. Uterus normal single, nontender. Adnexa normal adnexa.    With apex supported, anterior compartment defect was present  Pelvic floor strength I/V  Pelvic floor musculature: Right levator tender, Right obturator tender, Left levator tender, Left obturator non-tender  POP-Q:   POP-Q  0                                            Aa   -0.5                                            Ba  -4.5                                              C   4.5                                            Gh  4                                            Pb  9                                            tvl   -1.5                                            Ap  -1.5                                            Bp  -6.5                                              D      Rectal Exam:  Normal external rectal exam  Post-Void Residual (PVR) by Bladder Scan: In order to evaluate bladder emptying, we  discussed obtaining a postvoid residual and she agreed to this procedure.  Procedure: The ultrasound unit was placed on the patient's abdomen in the suprapubic region after the patient had voided. A PVR of 18 ml was obtained by bladder scan.  Laboratory Results: POC Urine:Negative for all components  ASSESSMENT AND PLAN Ms. Paris is a 62 y.o. with:  1. Prolapse of anterior vaginal wall   2. Uterine prolapse   3. Posterior vaginal wall prolapse   4. Levator spasm   5. SUI (stress urinary incontinence, female)   6. Urge incontinence   7. Urinary frequency   8. Vaginal atrophy    Patient has stage II/IV anterior, I/IV Uterine, and I/IV posterior vaginal prolapse.  She has been using a #3 ring pessary with support but she has some of the prolapse protruding around it and the cervix is pushing against the side of the pessary.  We discussed surgical options including hysteropexy with sacrospinous fixation, sacrocolpopexy with mesh and possible need for anterior repair. She is weary of mesh and seemed more interested in vaginal procedure. We talked about mesh being safer than it was 20 years ago, but there is still low risk of erosion, but with the vaginal surgery options, there is a higher risk of reoccurrence. Patient was given handouts to consider surgical options and if she would prefer to go the surgical route, she will need to be scheduled with a surgeon to further  discuss specific planning options.  On exam, patient had pain near the obturators, which could be contributing to her feeling of pain in her hip and the lower pelvic tension she described. She reports she had muscle relaxants in the past that were helpful. Will send a referral for pelvic floor PT at [facility].  Patient had a positive cough stress test today on exam. We discussed options for leakage including Mid-urethral surgical sling and urethral bulking. She reports she thinks she would be more interested in urethral bulking if she were to choose a surgical repair.  We discussed the symptoms of overactive bladder (OAB), which include urinary urgency, urinary frequency, nocturia, with or without urge incontinence.  While we do not know the exact etiology of OAB, several treatment options exist. We discussed management including behavioral therapy (decreasing bladder irritants, urge suppression strategies, timed voids, bladder retraining), and physical therapy. She is open to starting physical therapy if her insurance covers it. Referral placed for PT.  Will start with pelvic floor PT, and if patient needs further management can start medication therapy if needed.  Patient has vaginal atrophy on exam. She would benefit from estrogen cream. Patient to use a blueberry sized amount into the vagina. She may use this nightly for 2 weeks and then twice weekly after. We discussed using her finger instead of using the applicator.    Patient to return for pessary fitting in a few weeks or sooner if needed.    Selmer Dominion, NP

## 2023-04-02 NOTE — Progress Notes (Unsigned)
New Patient

## 2023-04-08 ENCOUNTER — Ambulatory Visit (INDEPENDENT_AMBULATORY_CARE_PROVIDER_SITE_OTHER): Payer: Medicaid Other

## 2023-04-08 DIAGNOSIS — E538 Deficiency of other specified B group vitamins: Secondary | ICD-10-CM | POA: Diagnosis not present

## 2023-04-08 MED ORDER — CYANOCOBALAMIN 1000 MCG/ML IJ SOLN
1000.0000 ug | Freq: Once | INTRAMUSCULAR | Status: AC
Start: 2023-04-08 — End: 2023-04-08
  Administered 2023-04-08: 1000 ug via INTRAMUSCULAR

## 2023-04-08 NOTE — Progress Notes (Signed)
Pt presented for their vitamin B12 injection. Pt was identified through two identifiers. Pt tolerated shot well in their right deltoid.  

## 2023-04-11 ENCOUNTER — Encounter (INDEPENDENT_AMBULATORY_CARE_PROVIDER_SITE_OTHER): Payer: Self-pay

## 2023-05-01 ENCOUNTER — Telehealth: Payer: Self-pay | Admitting: Obstetrics and Gynecology

## 2023-05-01 ENCOUNTER — Encounter: Payer: Self-pay | Admitting: Family

## 2023-05-01 ENCOUNTER — Ambulatory Visit: Payer: Medicaid Other | Admitting: Family

## 2023-05-01 VITALS — BP 110/70 | HR 61 | Temp 98.3°F | Ht 66.5 in | Wt 170.1 lb

## 2023-05-01 DIAGNOSIS — J302 Other seasonal allergic rhinitis: Secondary | ICD-10-CM | POA: Diagnosis not present

## 2023-05-01 DIAGNOSIS — T148XXA Other injury of unspecified body region, initial encounter: Secondary | ICD-10-CM | POA: Diagnosis not present

## 2023-05-01 DIAGNOSIS — R899 Unspecified abnormal finding in specimens from other organs, systems and tissues: Secondary | ICD-10-CM

## 2023-05-01 DIAGNOSIS — M545 Low back pain, unspecified: Secondary | ICD-10-CM

## 2023-05-01 DIAGNOSIS — R5383 Other fatigue: Secondary | ICD-10-CM

## 2023-05-01 DIAGNOSIS — L989 Disorder of the skin and subcutaneous tissue, unspecified: Secondary | ICD-10-CM | POA: Insufficient documentation

## 2023-05-01 LAB — URINALYSIS, ROUTINE W REFLEX MICROSCOPIC
Bilirubin Urine: NEGATIVE
Hgb urine dipstick: NEGATIVE
Ketones, ur: NEGATIVE
Leukocytes,Ua: NEGATIVE
Nitrite: NEGATIVE
RBC / HPF: NONE SEEN (ref 0–?)
Specific Gravity, Urine: 1.02 (ref 1.000–1.030)
Total Protein, Urine: NEGATIVE
Urine Glucose: NEGATIVE
Urobilinogen, UA: 0.2 (ref 0.0–1.0)
WBC, UA: NONE SEEN (ref 0–?)
pH: 6 (ref 5.0–8.0)

## 2023-05-01 MED ORDER — FLUTICASONE PROPIONATE 50 MCG/ACT NA SUSP: 2.0000 | Freq: Every day | NASAL | 6 refills | Status: DC

## 2023-05-01 MED ORDER — MELOXICAM 7.5 MG PO TABS: 7.5000 mg | ORAL_TABLET | Freq: Every day | ORAL | 1 refills | Status: DC | PRN

## 2023-05-01 MED ORDER — MUPIROCIN 2 % EX OINT
1.0000 | TOPICAL_OINTMENT | Freq: Two times a day (BID) | CUTANEOUS | 0 refills | Status: DC
Start: 2023-05-01 — End: 2023-07-18

## 2023-05-01 NOTE — Progress Notes (Signed)
Assessment & Plan:  Wound of skin Assessment & Plan: Question if trauma (working in yard) versus localized infection.  Provided with Bactroban.  No systemic features.  If wound does not heal, I reiterated the importance of dermatology consult to ensure not SCC v BCC.  Photo in chart.  Referral to Dermatology  Orders: -     Mupirocin; Apply 1 Application topically 2 (two) times daily. To right hand lesion  Dispense: 22 g; Refill: 0 -     Ambulatory referral to Dermatology  Abnormal laboratory test -     Urinalysis, Routine w reflex microscopic  Lumbar pain -     Meloxicam; Take 1 tablet (7.5 mg total) by mouth daily as needed for pain.  Dispense: 30 tablet; Refill: 1  Seasonal allergies -     Fluticasone Propionate; Place 2 sprays into both nostrils daily.  Dispense: 16 g; Refill: 6  Other fatigue Assessment & Plan: Chronic, unchanged.  Congratulated patient on diligence to exercise.  Suggested varying exercise.  Strongly reiterated the importance of evaluation for sleep apnea with ongoing fatigue.  She politely declines   reviewed cardiology, rheumatology, oncology/hematology consult with patient today.  She has had a very thorough evaluation.  Reviewed recent  02/22/23 without identification of vitamin deficiencies in the setting of fatigue.      Return precautions given.   Risks, benefits, and alternatives of the medications and treatment plan prescribed today were discussed, and patient expressed understanding.   Education regarding symptom management and diagnosis given to patient on AVS either electronically or printed.  Return in about 3 months (around 07/31/2023).  Rennie Plowman, FNP  Subjective:    Patient ID: Megan Harper, female    DOB: 1961/07/11, 62 y.o.   MRN: [id number]  CC: Megan Harper is a 62 y.o. female who presents today for follow up.   HPI: Complains of lesion on Right 3rd metacarpal which appeared a couple weeks ago.  She is not working in the  yard and not sure if an injury or an insect bite.  Describes as a 'little knot' and  erythematous.  No purulent discharge fever  Fatigue is unchanged She walks dogs 30  minutes dialy   Tdap is UTD She requests urine to rechecked after seeing  urobilirubin in UA one month ago Denies dysuria  Requests refill of flonase  Stress test 02/02/2022, low risk study Consult rheumatology 09/14/2021 Dr Corliss Skains for polyarthralgia; Polyarthralgia -she complains of pain in multiple joints over several years.  She also complains of muscle pain.  She had extensive work-up in the past by orthopedic surgeon.  She had labs done by her PCP in November which we reviewed.  All autoimmune work-up was negative.  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  Overall low risk of progression to myeloma.  Continue monitoring from 6 months to 12 months. Pt in agreement.    #Joint pain osteoarthritis-clinically not suggestive of multiple myeloma.  Reviewed x-rays from 2022-cervical spine/hips-no lytic lesions noted. Recommend follow up with rheumatology    Upcoming appointment with [facility] Allergies: Ceclor [cefaclor], Influenza vaccines, and Pantoprazole Current Outpatient Medications on File Prior to Visit  Medication Sig Dispense Refill   albuterol (VENTOLIN HFA) 108 (90 Base) MCG/ACT inhaler Inhale 2 puffs into the lungs every 6 (six) hours as needed for wheezing  or shortness of breath. 18 g 3   estradiol (ESTRACE) 0.1 MG/GM vaginal cream Place 0.5 g vaginally 2 (two) times a week. Place 0.5g nightly for two weeks then twice a week after 30 g 11   loratadine (CLARITIN) 10 MG tablet Take 10 mg by mouth daily.     mometasone (ELOCON) 0.1 % cream Use pea-sized amount in bilateral ear for itching as needed 15 g 1   omeprazole (PRILOSEC) 20 MG capsule TAKE 1  CAPSULE BY MOUTH DAILY AS NEEDED FOR EPIGASTRIC BURNING 90 capsule 1   Oxyquinoline-Sod Lauryl Sulf (TRIMO-SAN) 0.025-0.01 % GEL Place 1 Applicatorful vaginally once a week. 113.4 g 3   No current facility-administered medications on file prior to visit.    Review of Systems  Constitutional:  Positive for fatigue. Negative for chills and fever.  Respiratory:  Negative for cough.   Cardiovascular:  Negative for chest pain and palpitations.  Gastrointestinal:  Negative for nausea and vomiting.  Skin:  Positive for wound. Negative for rash.      Objective:    There were no vitals taken for this visit. BP Readings from Last 3 Encounters:  04/02/23 109/67  03/21/23 116/62  03/05/23 (!) 107/58   Wt Readings from Last 3 Encounters:  03/21/23 170 lb 9.6 oz (77.4 kg)  03/05/23 170 lb (77.1 kg)  02/22/23 170 lb (77.1 kg)     Physical Exam Vitals reviewed.  Constitutional:      Appearance: She is well-developed.  Eyes:     Conjunctiva/sclera: Conjunctivae normal.  Cardiovascular:     Rate and Rhythm: Normal rate and regular rhythm.     Pulses: Normal pulses.     Heart sounds: Normal heart sounds.  Pulmonary:     Effort: Pulmonary effort is normal.     Breath sounds: Normal breath sounds. No wheezing, rhonchi or rales.  Skin:    General: Skin is warm and dry.  Neurological:     Mental Status: She is alert.  Psychiatric:        Speech: Speech normal.        Behavior: Behavior normal.        Thought Content: Thought content normal.    2 to 3 cm raised slightly tender lesion right third lateral metacarpal.  Nonfluctuant.  No purulent discharge.  No red streaks.

## 2023-05-01 NOTE — Assessment & Plan Note (Signed)
Chronic, unchanged.  Congratulated patient on diligence to exercise.  Suggested varying exercise.  Strongly reiterated the importance of evaluation for sleep apnea with ongoing fatigue.  She politely declines   reviewed cardiology, rheumatology, oncology/hematology consult with patient today.  She has had a very thorough evaluation.  Reviewed recent  02/22/23 without identification of vitamin deficiencies in the setting of fatigue.

## 2023-05-01 NOTE — Assessment & Plan Note (Addendum)
Question if trauma (working in yard) versus localized infection.  Provided with Bactroban.  No systemic features.  If wound does not heal, I reiterated the importance of dermatology consult to ensure not SCC v BCC.  Photo in chart.  Referral to Dermatology

## 2023-05-01 NOTE — Telephone Encounter (Signed)
FYI- Pt called in today to cancel her pessary fitting for tomorrow. She has old pessary in place right now. Has GYN following up with her on that. Not ready for surgery at this time. Will call back when she's ready.

## 2023-05-01 NOTE — Patient Instructions (Addendum)
Please let me know when I can refer you to have sleep study to evaluate for sleep apnea. I strongly advise this in the setting of continued fatigue.   Please apply topical antibiotic ointment, Bactroban to your right finger.  Please let me know if does not completely resolve.  He may try a cool ice compress as well  I placed a referral to dermatology  Let us know if you dont hear back within a week in regards to an appointment being scheduled.   So that you are aware, if you are [facility] MyChart user , please pay attention to your MyChart messages as you may receive a MyChart message with a phone number to call and schedule this test/appointment own your own from our referral coordinator. This is a new process so I do not want you to miss this message.  If you are not a MyChart user, you will receive a phone call.

## 2023-05-02 ENCOUNTER — Ambulatory Visit: Payer: Medicaid Other | Admitting: Obstetrics and Gynecology

## 2023-05-09 ENCOUNTER — Ambulatory Visit: Payer: Medicaid Other

## 2023-05-10 ENCOUNTER — Ambulatory Visit (INDEPENDENT_AMBULATORY_CARE_PROVIDER_SITE_OTHER): Payer: Medicaid Other

## 2023-05-10 DIAGNOSIS — E538 Deficiency of other specified B group vitamins: Secondary | ICD-10-CM | POA: Diagnosis not present

## 2023-05-10 MED ORDER — CYANOCOBALAMIN 1000 MCG/ML IJ SOLN
1000.0000 ug | Freq: Once | INTRAMUSCULAR | Status: AC
Start: 2023-05-10 — End: 2023-05-10
  Administered 2023-05-10: 1000 ug via INTRAMUSCULAR

## 2023-05-10 NOTE — Progress Notes (Signed)
Patient presented for B 12 injection to left deltoid, patient voiced no concerns nor showed any signs of distress during injection.

## 2023-05-16 ENCOUNTER — Telehealth: Payer: Self-pay | Admitting: Family

## 2023-05-16 NOTE — Telephone Encounter (Signed)
Pt called stating she would like her dermatology referral to go to [facility] dermatology in [place]

## 2023-05-24 ENCOUNTER — Other Ambulatory Visit (INDEPENDENT_AMBULATORY_CARE_PROVIDER_SITE_OTHER): Payer: Self-pay | Admitting: Nurse Practitioner

## 2023-05-24 DIAGNOSIS — I83813 Varicose veins of bilateral lower extremities with pain: Secondary | ICD-10-CM

## 2023-05-31 ENCOUNTER — Encounter (INDEPENDENT_AMBULATORY_CARE_PROVIDER_SITE_OTHER): Payer: Self-pay | Admitting: Nurse Practitioner

## 2023-05-31 ENCOUNTER — Ambulatory Visit (INDEPENDENT_AMBULATORY_CARE_PROVIDER_SITE_OTHER): Payer: Medicaid Other | Admitting: Nurse Practitioner

## 2023-05-31 ENCOUNTER — Ambulatory Visit (INDEPENDENT_AMBULATORY_CARE_PROVIDER_SITE_OTHER): Payer: Self-pay

## 2023-05-31 VITALS — BP 128/66 | HR 68 | Resp 16 | Wt 171.4 lb

## 2023-05-31 DIAGNOSIS — I83813 Varicose veins of bilateral lower extremities with pain: Secondary | ICD-10-CM

## 2023-05-31 DIAGNOSIS — I83819 Varicose veins of unspecified lower extremities with pain: Secondary | ICD-10-CM

## 2023-06-04 ENCOUNTER — Encounter (INDEPENDENT_AMBULATORY_CARE_PROVIDER_SITE_OTHER): Payer: Self-pay | Admitting: Nurse Practitioner

## 2023-06-04 NOTE — Progress Notes (Signed)
Subjective:    Patient ID: Megan Harper, female    DOB: 10/12/1960, 62 y.o.   MRN: [id number] Chief Complaint  Patient presents with   New Patient (Initial Visit)    Ref Jesse Sans consult hx of lle dvt    The patient is seen for evaluation of symptomatic varicose veins. The patient relates burning and stinging which worsened steadily throughout the course of the day, particularly with standing. The patient also notes an aching and throbbing pain over the varicosities, particularly with prolonged dependent positions. The symptoms are significantly improved with elevation.  The patient also notes that during hot weather the symptoms are greatly intensified. The patient states the pain from the varicose veins interferes with work, daily exercise, shopping and household maintenance. At this point, the symptoms are persistent and severe enough that they're having a negative impact on lifestyle and are interfering with daily activities.  There is no history of DVT, PE or superficial thrombophlebitis. There is no history of ulceration or hemorrhage. The patient denies a significant family history of varicose veins.  The patient  not worn graduated compression in the past. At the present time the patient has been using over-the-counter analgesics. There is no history of prior surgical intervention or sclerotherapy.  Today noninvasive studies show no evidence of DVT or superficial drainage Vitas bilaterally.  No evidence of deep venous insufficiency or superficial venous reflux bilaterally.      Review of Systems  All other systems reviewed and are negative.      Objective:   Physical Exam Vitals reviewed.  HENT:     Head: Normocephalic.  Cardiovascular:     Rate and Rhythm: Normal rate.     Pulses: Normal pulses.  Pulmonary:     Effort: Pulmonary effort is normal.  Musculoskeletal:        General: Tenderness present.  Skin:    General: Skin is warm and dry.  Neurological:      Mental Status: She is alert and oriented to person, place, and time.  Psychiatric:        Mood and Affect: Mood normal.        Behavior: Behavior normal.        Thought Content: Thought content normal.        Judgment: Judgment normal.     BP 128/66 (BP Location: Right Arm)   Pulse 68   Resp 16   Wt 171 lb 6.4 oz (77.7 kg)   BMI 27.25 kg/m   Past Medical History:  Diagnosis Date   Abnormal SPEP    Anxiety    Arthralgia    Cervical dysplasia    COPD (chronic obstructive pulmonary disease) (HCC)    Emphysema of lung (HCC)    Endometriosis    Fibrocystic breast disease    GERD (gastroesophageal reflux disease)    Osteoporosis    Thyroid nodule    Vitamin D insufficiency     Social History   Socioeconomic History   Marital status: Married    Spouse name: Not on file   Number of children: 3   Years of education: Not on file   Highest education level: Not on file  Occupational History   Not on file  Tobacco Use   Smoking status: Former    Current packs/day: 0.00    Average packs/day: 2.5 packs/day for 31.0 years (77.5 ttl pk-yrs)    Types: Cigarettes    Start date: 09/17/1978    Quit date: 09/17/2009  Years since quitting: 13.7   Smokeless tobacco: Never  Vaping Use   Vaping status: Never Used  Substance and Sexual Activity   Alcohol use: No   Drug use: No   Sexual activity: Yes    Birth control/protection: Surgical, Post-menopausal    Comment: Tubal  Other Topics Concern   Not on file  Social History Narrative   Lives in [place]; with husband- 3 dogs. Quit smoking 2010. No alcohol. Used to work at [employer].    Social Determinants of Health   Financial Resource Strain: Not on file  Food Insecurity: Not on file  Transportation Needs: Not on file  Physical Activity: Not on file  Stress: Not on file  Social Connections: Not on file  Intimate Partner Violence: Not on file    Past Surgical History:  Procedure Laterality Date   laproscopy for  endometriosis     THYROID LOBECTOMY Right 03/08/2010   TUBAL LIGATION  01/23/1986    Family History  Problem Relation Age of Onset   Uterine cancer Mother    Lung cancer Father        smoked   Bladder Cancer Sister    Esophageal cancer Maternal Aunt    Lymphoma Maternal Aunt    Kidney cancer Maternal Uncle    Prostate cancer Maternal Uncle    Heart disease Maternal Uncle    Heart disease Maternal Grandmother    Hypertension Maternal Grandmother    Thyroid disease Maternal Grandmother    Throat cancer Maternal Grandmother    Prostate cancer Maternal Grandfather    Hypertension Maternal Grandfather    Colon cancer Maternal Grandfather    Kidney cancer Maternal Grandfather    Heart disease Paternal Grandmother    Diabetes Paternal Grandmother    Breast cancer Neg Hx     Allergies  Allergen Reactions   Ceclor [Cefaclor] Other (See Comments)    Flu like symptoms   Influenza Vaccines Nausea And Vomiting    Fever, sweating eyes bloodshot nausea also   Pantoprazole Itching       Latest Ref Rng & Units 11/19/2022   10:17 AM 10/25/2022    2:06 PM 04/04/2022    3:06 PM  CBC  WBC 4.0 - 10.5 K/uL 5.2  6.1  6.1   Hemoglobin 12.0 - 15.0 g/dL 29.5  28.4  13.2   Hematocrit 36.0 - 46.0 % 41.0  42.9  39.9   Platelets 150.0 - 400.0 K/uL 281.0  285  274       CMP     Component Value Date/Time   NA 139 11/19/2022 1017   NA 141 07/12/2021 1436   K 4.1 11/19/2022 1017   CL 100 11/19/2022 1017   CO2 31 11/19/2022 1017   GLUCOSE 91 11/19/2022 1017   BUN 12 11/19/2022 1017   BUN 8 07/12/2021 1436   CREATININE 0.75 11/19/2022 1017   CALCIUM 9.4 11/19/2022 1017   PROT 7.0 11/19/2022 1017   PROT 6.7 07/12/2021 1436   ALBUMIN 4.3 11/19/2022 1017   ALBUMIN 4.5 07/12/2021 1436   AST 16 11/19/2022 1017   ALT 14 11/19/2022 1017   ALKPHOS 78 11/19/2022 1017   BILITOT 0.7 11/19/2022 1017   BILITOT 0.4 07/12/2021 1436   GFR 86.35 11/19/2022 1017   EGFR 82 07/12/2021 1436   GFRNONAA  >60 10/25/2022 1406     No results found.     Assessment & Plan:   1. Varicose veins with pain Recommend:  The patient has persistent  symptoms of pain and swelling that are having a negative impact on daily life and daily activities.  Patient should undergo injection sclerotherapy to treat the residual varicosities.  The risks, benefits and alternative therapies were reviewed in detail with the patient.  All questions were answered.  The patient agrees to proceed with sclerotherapy at their convenience.  The patient will continue wearing the graduated compression stockings and using the over-the-counter pain medications to treat her symptoms.       Current Outpatient Medications on File Prior to Visit  Medication Sig Dispense Refill   albuterol (VENTOLIN HFA) 108 (90 Base) MCG/ACT inhaler Inhale 2 puffs into the lungs every 6 (six) hours as needed for wheezing or shortness of breath. 18 g 3   estradiol (ESTRACE) 0.1 MG/GM vaginal cream Place 0.5 g vaginally 2 (two) times a week. Place 0.5g nightly for two weeks then twice a week after 30 g 11   fluticasone (FLONASE) 50 MCG/ACT nasal spray Place 2 sprays into both nostrils daily. 16 g 6   loratadine (CLARITIN) 10 MG tablet Take 10 mg by mouth daily.     meloxicam (MOBIC) 7.5 MG tablet Take 1 tablet (7.5 mg total) by mouth daily as needed for pain. 30 tablet 1   mometasone (ELOCON) 0.1 % cream Use pea-sized amount in bilateral ear for itching as needed 15 g 1   mupirocin ointment (BACTROBAN) 2 % Apply 1 Application topically 2 (two) times daily. To right hand lesion 22 g 0   omeprazole (PRILOSEC) 20 MG capsule TAKE 1 CAPSULE BY MOUTH DAILY AS NEEDED FOR EPIGASTRIC BURNING 90 capsule 1   Oxyquinoline-Sod Lauryl Sulf (TRIMO-SAN) 0.025-0.01 % GEL Place 1 Applicatorful vaginally once a week. 113.4 g 3   No current facility-administered medications on file prior to visit.    There are no Patient Instructions on file for this visit. No  follow-ups on file.   Georgiana Spinner, NP

## 2023-06-10 ENCOUNTER — Ambulatory Visit (INDEPENDENT_AMBULATORY_CARE_PROVIDER_SITE_OTHER): Payer: Medicaid Other

## 2023-06-10 DIAGNOSIS — E538 Deficiency of other specified B group vitamins: Secondary | ICD-10-CM

## 2023-06-10 MED ORDER — CYANOCOBALAMIN 1000 MCG/ML IJ SOLN
1000.0000 ug | Freq: Once | INTRAMUSCULAR | Status: AC
Start: 2023-06-10 — End: 2023-06-10
  Administered 2023-06-10: 1000 ug via INTRAMUSCULAR

## 2023-06-10 NOTE — Progress Notes (Signed)
Patient presented for a B12 injection and it was administered into her right deltoid. Patient tolerated the injection well.

## 2023-06-11 ENCOUNTER — Telehealth: Payer: Self-pay | Admitting: Obstetrics and Gynecology

## 2023-06-11 NOTE — Telephone Encounter (Signed)
I contacted the patient for rescheduled for 06/25/23 with Dr Valentino Saxon x2. I left message asking the patient to contact the office for rescheduled. I have the patient rescheduled to next opening on Wednesday, 10/30 at 1:35 pm with Dr. Valentino Saxon.

## 2023-06-25 ENCOUNTER — Ambulatory Visit: Payer: Medicaid Other | Admitting: Obstetrics and Gynecology

## 2023-06-25 NOTE — Telephone Encounter (Signed)
Pt called stating the dermatology could not see her because the referral was not on a medicaid form

## 2023-06-26 ENCOUNTER — Ambulatory Visit: Payer: Medicaid Other | Admitting: Obstetrics and Gynecology

## 2023-06-26 ENCOUNTER — Encounter: Payer: Self-pay | Admitting: Obstetrics and Gynecology

## 2023-06-26 VITALS — BP 100/49 | HR 71 | Resp 16 | Ht 65.5 in | Wt 174.2 lb

## 2023-06-26 DIAGNOSIS — Z4689 Encounter for fitting and adjustment of other specified devices: Secondary | ICD-10-CM

## 2023-06-26 DIAGNOSIS — N812 Incomplete uterovaginal prolapse: Secondary | ICD-10-CM | POA: Diagnosis not present

## 2023-06-26 DIAGNOSIS — N3941 Urge incontinence: Secondary | ICD-10-CM | POA: Diagnosis not present

## 2023-06-26 NOTE — Progress Notes (Signed)
GYNECOLOGY PROGRESS NOTE  Subjective:    Patient ID: Megan Harper, female    DOB: Sep 18, 1960, 62 y.o.   MRN: [id number]  HPI  Patient is a 62 y.o. G65P2103 female who presents for pessary maintenance.  Patient has a Grade 2-3 cystocele with some uterine descensus (Grade 2). She currently has a size 3 ring with support in place. . Patient reports that she has not been wearing pessary continuously. She continues to feel pressure and have some urgency.  She reports no vaginal bleeding or discharge. She denies pelvic discomfort and difficulty urinating or moving her bowels. She was evaluated by Urogyn as recommended. She reports that a different pessary was recommended by urogyn and they also prescribed her Estrace cream. She is not using the Estrace cream because of all the side effects it can cause. Also recommended physical therapy, which patient does not feel will help much.  Will continue with use of Trimo-san gel. Also notes that they discussed surgical options with patient.  Notes that she is thinking about surgery but does not desire anything extensive.  May consider bladder tack, possibly hysterectomy.  Would like to have surgery sometime next summer.   The following portions of the patient's history were reviewed and updated as appropriate: allergies, current medications, past family history, past medical history, past social history, past surgical history, and problem list.  Review of Systems Pertinent items noted in HPI and remainder of comprehensive ROS otherwise negative.   Objective:   Blood pressure (!) 100/49, pulse 71, resp. rate 16, height 5' 5.5" (1.664 m), weight 174 lb 3.2 oz (79 kg).  Body mass index is 28.55 kg/m. General appearance: alert, cooperative, and no distress Abdomen: soft, non-tender; bowel sounds normal; no masses,  no organomegaly Pelvic:  ?The patient's Size 3 ring with support pessary was removed, cleaned and replaced without complications. Speculum  examination revealed normal vaginal mucosa with no lesions or lacerations.   Assessment:   1. Cystocele with incomplete uterovaginal prolapse   2. Pessary maintenance   3. Urge incontinence      Plan:   - Some small descensus still noted with current pessary (although not as significant) as without the pessary.  Discussed ordering a different size/style  of pessary. WIll order Shaatz pessary, size 2 3/4.   - Patient to follow up once pessary arrives.      Hildred Laser, MD [facility]

## 2023-07-04 NOTE — Telephone Encounter (Signed)
Ok. Thank you.

## 2023-07-09 NOTE — Telephone Encounter (Signed)
Medicaid form placed in provider folder to be completed

## 2023-07-10 ENCOUNTER — Ambulatory Visit: Payer: Medicaid Other

## 2023-07-10 DIAGNOSIS — E538 Deficiency of other specified B group vitamins: Secondary | ICD-10-CM

## 2023-07-10 MED ORDER — CYANOCOBALAMIN 1000 MCG/ML IJ SOLN
1000.0000 ug | Freq: Once | INTRAMUSCULAR | Status: AC
Start: 2023-07-10 — End: 2023-07-10
  Administered 2023-07-10: 1000 ug via INTRAMUSCULAR

## 2023-07-10 NOTE — Progress Notes (Signed)
Patient presented for B 12 injection to right deltoid, patient voiced no concerns nor showed any signs of distress during injection. 

## 2023-07-11 ENCOUNTER — Ambulatory Visit: Payer: Medicaid Other

## 2023-07-15 ENCOUNTER — Telehealth: Payer: Self-pay | Admitting: Obstetrics and Gynecology

## 2023-07-15 NOTE — Telephone Encounter (Signed)
Reached out to pt to schedule pessary placement with Dr. Valentino Saxon.  Dr. Valentino Saxon would like for the pt to come in on Nov. 21 at 8:35.  Left message for pt to call back to schedule.

## 2023-07-16 NOTE — Telephone Encounter (Signed)
The patient is scheduled.

## 2023-07-16 NOTE — Telephone Encounter (Signed)
I contacted the patient via phone. I left message asking the patient to contact the office.

## 2023-07-17 NOTE — Progress Notes (Unsigned)
    GYNECOLOGY PROGRESS NOTE  Subjective:    Patient ID: Megan Harper, female    DOB: Oct 17, 1960, 62 y.o.   MRN: [id number]  HPI  Patient is a 62 y.o. G70P2103 female who presents for pessary insertion. Patient has a Grade 2-3 cystocele with some uterine descensus (Grade 2). She currently has a size 3 ring with support in place. . Patient reports that she has not been wearing pessary continuously. She continues to feel pressure and have some urgency.  She reports no vaginal bleeding or discharge. She denies pelvic discomfort and difficulty urinating or moving her bowels. She was evaluated by Urogyn as recommended. She reports that a different pessary was recommended by urogyn and they also prescribed her Estrace cream. She is not using the Estrace cream because of all the side effects it can cause. Also recommended physical therapy, which patient does not feel will help much.  Will continue with use of Trimo-san gel. Also notes that they discussed surgical options with patient.  Notes that she is thinking about surgery but does not desire anything extensive.  May consider bladder tack, possibly hysterectomy.  Would like to have surgery sometime next summer. She is here today to have a Shaatz Folding pessary inserted until she decides to have surgery.  The following portions of the patient's history were reviewed and updated as appropriate: allergies, current medications, past family history, past medical history, past social history, past surgical history, and problem list.   Review of Systems Pertinent items noted in HPI and remainder of comprehensive ROS otherwise negative.   Objective:   There were no vitals taken for this visit. There is no height or weight on file to calculate BMI. General appearance: alert, cooperative, and no distress Abdomen: {abdominal exam:16834} Pelvic: {pelvic exam:16852::"cervix normal in appearance","external genitalia normal","no adnexal masses or tenderness","no  cervical motion tenderness","rectovaginal septum normal","uterus normal size, shape, and consistency","vagina normal without discharge"} Extremities: {extremity exam:5109} Neurologic: {neuro exam:17854}   Assessment:   1. Cystocele with incomplete uterovaginal prolapse   2. Pessary maintenance   3. Urge incontinence      Plan:   There are no diagnoses linked to this encounter.      Hildred Laser, MD [facility]

## 2023-07-17 NOTE — Patient Instructions (Signed)
How to Use a Vaginal Pessary  A vaginal pessary is a removable device that is placed into your vagina to support pelvic organs that droop. These organs include your uterus, bladder, and rectum. When your pelvic organs drop down into your vagina, it causes a condition called pelvic organ prolapse (POP). A pessary may be an alternative to surgery for women with POP. It may help women who leak urine when they strain or exercise (stress incontinence). This is a symptom of POP. A vaginal pessary may also be a temporary treatment for stress incontinence during pregnancy. There are several types of pessaries. All types are usually made of silicone. You can insert and remove some on your own. Other types must be inserted and removed by your health care provider at office visits. The reason you are using a pessary and the severity of your condition will determine which one is best for you. It is also important to find the right size. A pessary that is too small may fall out. A pessary that is too large may cause pain or discomfort. Your health care provider will do a physical exam to find the correct size and fit for your pessary. It may take several appointments to find the best fit for you. If you can be fit with the type of pessary that you can insert, remove, and clean yourself, your health care provider will teach you how to use your pessary at home. You may have checkups every few months. If you have the type of pessary that needs to be inserted and removed by your health care provider, you will have appointments every few months to have the pessary removed, cleaned, and replaced. What are the risks? When properly fitted and cared for, risks of using a vaginal pessary can be small. However, there can be problems that may include: Vaginal discharge. Vaginal bleeding. A bad smell coming from your vagina. Scraping of the skin inside your vagina. How to use your pessary Follow your health care provider's  instructions for using a pessary. These instructions may vary, depending on the type of pessary you have. To insert a pessary: Wash your hands with soap and water for at least 20 seconds. Squeeze or fold the pessary in half and lubricate the tip with a water-based lubricant. Insert the pessary into your vagina. It will unfold and provide support. To remove the pessary, gently tug it out of your vagina. You can remove the pessary every night or after several days. You can also remove it to have sex. How to care for your pessary If you have a pessary that you can remove: Clean your pessary with soap and water. Rinse well. Dry it completely before inserting it back into your vagina. Follow these instructions at home: Take over-the-counter and prescription medicines only as told by your health care provider. Your health care provider may prescribe an estrogen cream to moisten your vagina. Keep all follow-up visits. This is important. Contact a health care provider if: You feel any pain or discomfort when your pessary is in place. You continue to have stress incontinence. You have trouble keeping your pessary from falling out. You have an unusual vaginal discharge that is blood-tinged or smells bad. Summary A vaginal pessary is a removable device that is placed into your vagina to support pelvic organs that droop. This condition is called pelvic organ prolapse (POP). There are several types of pessaries. Some you can insert and remove on your own. Others must be inserted and  removed by your health care provider. The best type for you depends on the reason you are using a pessary and the severity of your condition. It is also important to find the right size. If you can use the type that you insert and remove on your own, your health care provider will teach you how to use it and schedule checkups every few months. If you have the type that needs to be inserted and removed by your health care  provider, you will have regular appointments to have your pessary removed, cleaned, and replaced. This information is not intended to replace advice given to you by your health care provider. Make sure you discuss any questions you have with your health care provider. Document Revised: 02/11/2020 Document Reviewed: 02/11/2020 Elsevier Patient Education  2024 [employer].

## 2023-07-18 ENCOUNTER — Encounter: Payer: Self-pay | Admitting: Obstetrics and Gynecology

## 2023-07-18 ENCOUNTER — Ambulatory Visit: Payer: Medicaid Other | Admitting: Obstetrics and Gynecology

## 2023-07-18 VITALS — BP 113/57 | HR 60 | Resp 16 | Ht 66.5 in | Wt 175.6 lb

## 2023-07-18 DIAGNOSIS — Z4689 Encounter for fitting and adjustment of other specified devices: Secondary | ICD-10-CM | POA: Diagnosis not present

## 2023-07-18 DIAGNOSIS — N3941 Urge incontinence: Secondary | ICD-10-CM

## 2023-07-18 DIAGNOSIS — N812 Incomplete uterovaginal prolapse: Secondary | ICD-10-CM

## 2023-07-31 ENCOUNTER — Encounter: Payer: Self-pay | Admitting: Family

## 2023-07-31 ENCOUNTER — Ambulatory Visit: Payer: Medicaid Other | Admitting: Family

## 2023-07-31 VITALS — BP 130/82 | HR 65 | Temp 97.7°F | Ht 66.5 in | Wt 175.0 lb

## 2023-07-31 DIAGNOSIS — E890 Postprocedural hypothyroidism: Secondary | ICD-10-CM | POA: Diagnosis not present

## 2023-07-31 DIAGNOSIS — Z8639 Personal history of other endocrine, nutritional and metabolic disease: Secondary | ICD-10-CM | POA: Diagnosis not present

## 2023-07-31 DIAGNOSIS — E041 Nontoxic single thyroid nodule: Secondary | ICD-10-CM

## 2023-07-31 DIAGNOSIS — Z780 Asymptomatic menopausal state: Secondary | ICD-10-CM | POA: Diagnosis not present

## 2023-07-31 DIAGNOSIS — E538 Deficiency of other specified B group vitamins: Secondary | ICD-10-CM | POA: Diagnosis not present

## 2023-07-31 LAB — T3, FREE: T3, Free: 3.5 pg/mL (ref 2.3–4.2)

## 2023-07-31 LAB — VITAMIN D 25 HYDROXY (VIT D DEFICIENCY, FRACTURES): VITD: 35.72 ng/mL (ref 30.00–100.00)

## 2023-07-31 LAB — TSH: TSH: 2.59 u[IU]/mL (ref 0.35–5.50)

## 2023-07-31 LAB — B12 AND FOLATE PANEL
Folate: 9.5 ng/mL (ref >5.9)
Vitamin B-12: 432 pg/mL (ref 211–911)

## 2023-07-31 LAB — T4, FREE: Free T4: 1.07 ng/dL (ref 0.60–1.60)

## 2023-07-31 NOTE — Patient Instructions (Signed)
Please call  and schedule your 3D mammogram and /or bone density scan as we discussed.   [facility]  ( new location in 2023)  [address], [place], [place] [address]  [place], [place]  [phone]   Referral to endocrine.   Let us know if you dont hear back within a week in regards to an appointment being scheduled.   So that you are aware, if you are [facility] MyChart user , please pay attention to your MyChart messages as you may receive a MyChart message with a phone number to call and schedule this test/appointment own your own from our referral coordinator. This is a new process so I do not want you to miss this message.  If you are not a MyChart user, you will receive a phone call.

## 2023-07-31 NOTE — Telephone Encounter (Signed)
Is the medicaid form complete?  Pt has yet to be scheduled with dermatology She prefers [facility], [place] at [facility] location  Please let me know if you need anything from me

## 2023-07-31 NOTE — Progress Notes (Unsigned)
   Assessment & Plan:  There are no diagnoses linked to this encounter.   Return precautions given.   Risks, benefits, and alternatives of the medications and treatment plan prescribed today were discussed, and patient expressed understanding.   Education regarding symptom management and diagnosis given to patient on AVS either electronically or printed.  No follow-ups on file.  Rennie Plowman, FNP  Subjective:    Patient ID: Megan Harper, female    DOB: 11/25/1960, 62 y.o.   MRN: [id number]  CC: Megan Harper is a 62 y.o. female who presents today for follow up.   HPI: Feels well today.  No new complaints.  She is interested in following up with endocrinology.  History of S/p right thyroidectomy,  Thyroid nodule left 1.1cm    Allergies: Ceclor [cefaclor], Influenza vaccines, and Pantoprazole Current Outpatient Medications on File Prior to Visit  Medication Sig Dispense Refill   albuterol (VENTOLIN HFA) 108 (90 Base) MCG/ACT inhaler Inhale 2 puffs into the lungs every 6 (six) hours as needed for wheezing or shortness of breath. 18 g 3   fluticasone (FLONASE) 50 MCG/ACT nasal spray Place 2 sprays into both nostrils daily. 16 g 6   loratadine (CLARITIN) 10 MG tablet Take 10 mg by mouth daily.     meloxicam (MOBIC) 7.5 MG tablet Take 1 tablet (7.5 mg total) by mouth daily as needed for pain. 30 tablet 1   mometasone (ELOCON) 0.1 % cream Use pea-sized amount in bilateral ear for itching as needed 15 g 1   omeprazole (PRILOSEC) 20 MG capsule TAKE 1 CAPSULE BY MOUTH DAILY AS NEEDED FOR EPIGASTRIC BURNING 90 capsule 1   Oxyquinoline-Sod Lauryl Sulf (TRIMO-SAN) 0.025-0.01 % GEL Place 1 Applicatorful vaginally once a week. 113.4 g 3   No current facility-administered medications on file prior to visit.    Review of Systems  Constitutional:  Negative for chills and fever.  Respiratory:  Negative for cough.   Cardiovascular:  Negative for chest pain and palpitations.   Gastrointestinal:  Negative for nausea and vomiting.      Objective:    BP 130/82   Pulse 65   Temp 97.7 F (36.5 C) (Oral)   Ht 5' 6.5" (1.689 m)   Wt 175 lb (79.4 kg)   SpO2 98%   BMI 27.82 kg/m  BP Readings from Last 3 Encounters:  07/31/23 130/82  07/18/23 (!) 113/57  06/26/23 (!) 100/49   Wt Readings from Last 3 Encounters:  07/31/23 175 lb (79.4 kg)  07/18/23 175 lb 9.6 oz (79.7 kg)  06/26/23 174 lb 3.2 oz (79 kg)    Physical Exam Vitals reviewed.  Constitutional:      Appearance: She is well-developed.  Eyes:     Conjunctiva/sclera: Conjunctivae normal.  Cardiovascular:     Rate and Rhythm: Normal rate and regular rhythm.     Pulses: Normal pulses.     Heart sounds: Normal heart sounds.  Pulmonary:     Effort: Pulmonary effort is normal.     Breath sounds: Normal breath sounds. No wheezing, rhonchi or rales.  Skin:    General: Skin is warm and dry.  Neurological:     Mental Status: She is alert.  Psychiatric:        Speech: Speech normal.        Behavior: Behavior normal.        Thought Content: Thought content normal.

## 2023-08-01 NOTE — Assessment & Plan Note (Signed)
Lab Results  Component Value Date   TSH 2.59 07/31/2023   Euthyroid.  Thyroid ultrasound up-to-date from May of this year.  Referral to endocrinology for further surveillance

## 2023-08-01 NOTE — Assessment & Plan Note (Signed)
Lab Results  Component Value Date   VITAMINB12 432 07/31/2023   B12 is optimal.  Continue B12 injections monthly if preferred indefinitely.  I did however discuss she may transition to sublingual b12 if she desired.

## 2023-08-02 ENCOUNTER — Telehealth: Payer: Self-pay | Admitting: Family

## 2023-08-02 NOTE — Telephone Encounter (Signed)
Call pt  I received a call from [facility] endocrine and they do not take insurance.   Would  she like to go to [facility] endocrinology or [facility]?

## 2023-08-05 NOTE — Telephone Encounter (Signed)
Spoke to pt and she is ok with you sending referral to [facility]

## 2023-08-07 ENCOUNTER — Other Ambulatory Visit: Payer: Self-pay | Admitting: Family

## 2023-08-07 DIAGNOSIS — E890 Postprocedural hypothyroidism: Secondary | ICD-10-CM

## 2023-08-07 NOTE — Progress Notes (Unsigned)
    GYNECOLOGY PROGRESS NOTE  Subjective:    Patient ID: Megan Harper, female    DOB: 1960-12-01, 62 y.o.   MRN: [id number]  HPI  Patient is a 62 y.o. G76P2103 female who presents for Pessary Maintenance. Patient has a Grade 2-3 cystocele with some uterine descensus (Grade 2). She previously had a size 3 ring with support in place however was still noting some mild level of descensus and so is changing to a Shaatz pessary. She had some thick, off white discharge 2 days after pessary was inserted. She also noticed stinging and burning after insertion, but it has resolved. The discharge is better as well. She continues to use the Trimo-san gel once weekly as prescribed. Denies any vaginal bleeding or difficulty urinating or moving her bowels.   The following portions of the patient's history were reviewed and updated as appropriate: allergies, current medications, past family history, past medical history, past social history, past surgical history, and problem list.  Review of Systems Pertinent items noted in HPI and remainder of comprehensive ROS otherwise negative.   Objective:   Blood pressure 123/63, pulse 63, height 5' 6.5" (1.689 m), weight 176 lb (79.8 kg).  Body mass index is 27.98 kg/m. General appearance: alert and no distress Abdomen: soft, non-tender; bowel sounds normal; no masses,  no organomegaly Pelvis: The patient's  Size 3 Shaatz pessary was removed, cleaned and replaced without complications.  Speculum examination revealed normal vaginal mucosa with no lesions or lacerations. Minimal discharge noted in vaginal vault. Patient given opportunity to remove and replace pessary herself after cleaning, with instructions, was successful.    Assessment:   1. Pessary maintenance   2. Cystocele with incomplete uterovaginal prolapse   3. Urge incontinence      Plan:   - The patient should return in 3 months for a pessary check and continue to use Trimosan gel weekly as  prescribed.  If she continues to be able to maintain her pessary herself successfully, can decrease to q yearly visits.  - Urge incontinence seems to have improved with use of pessary.    A total of 15 minutes were spent face-to-face with the patient during this encounter and over half of that time involved counseling, demonstrations, and instructions of insertion/removal of pessary and home care.  Hildred Laser, MD [facility]

## 2023-08-07 NOTE — Patient Instructions (Signed)
How to Use a Vaginal Pessary  A vaginal pessary is a removable device that is placed into your vagina to support pelvic organs that droop. These organs include your uterus, bladder, and rectum. When your pelvic organs drop down into your vagina, it causes a condition called pelvic organ prolapse (POP). A pessary may be an alternative to surgery for women with POP. It may help women who leak urine when they strain or exercise (stress incontinence). This is a symptom of POP. A vaginal pessary may also be a temporary treatment for stress incontinence during pregnancy. There are several types of pessaries. All types are usually made of silicone. You can insert and remove some on your own. Other types must be inserted and removed by your health care provider at office visits. The reason you are using a pessary and the severity of your condition will determine which one is best for you. It is also important to find the right size. A pessary that is too small may fall out. A pessary that is too large may cause pain or discomfort. Your health care provider will do a physical exam to find the correct size and fit for your pessary. It may take several appointments to find the best fit for you. If you can be fit with the type of pessary that you can insert, remove, and clean yourself, your health care provider will teach you how to use your pessary at home. You may have checkups every few months. If you have the type of pessary that needs to be inserted and removed by your health care provider, you will have appointments every few months to have the pessary removed, cleaned, and replaced. What are the risks? When properly fitted and cared for, risks of using a vaginal pessary can be small. However, there can be problems that may include: Vaginal discharge. Vaginal bleeding. A bad smell coming from your vagina. Scraping of the skin inside your vagina. How to use your pessary Follow your health care provider's  instructions for using a pessary. These instructions may vary, depending on the type of pessary you have. To insert a pessary: Wash your hands with soap and water for at least 20 seconds. Squeeze or fold the pessary in half and lubricate the tip with a water-based lubricant. Insert the pessary into your vagina. It will unfold and provide support. To remove the pessary, gently tug it out of your vagina. You can remove the pessary every night or after several days. You can also remove it to have sex. How to care for your pessary If you have a pessary that you can remove: Clean your pessary with soap and water. Rinse well. Dry it completely before inserting it back into your vagina. Follow these instructions at home: Take over-the-counter and prescription medicines only as told by your health care provider. Your health care provider may prescribe an estrogen cream to moisten your vagina. Keep all follow-up visits. This is important. Contact a health care provider if: You feel any pain or discomfort when your pessary is in place. You continue to have stress incontinence. You have trouble keeping your pessary from falling out. You have an unusual vaginal discharge that is blood-tinged or smells bad. Summary A vaginal pessary is a removable device that is placed into your vagina to support pelvic organs that droop. This condition is called pelvic organ prolapse (POP). There are several types of pessaries. Some you can insert and remove on your own. Others must be inserted and  removed by your health care provider. The best type for you depends on the reason you are using a pessary and the severity of your condition. It is also important to find the right size. If you can use the type that you insert and remove on your own, your health care provider will teach you how to use it and schedule checkups every few months. If you have the type that needs to be inserted and removed by your health care  provider, you will have regular appointments to have your pessary removed, cleaned, and replaced. This information is not intended to replace advice given to you by your health care provider. Make sure you discuss any questions you have with your health care provider. Document Revised: 02/11/2020 Document Reviewed: 02/11/2020 Elsevier Patient Education  2024 [employer].

## 2023-08-08 ENCOUNTER — Ambulatory Visit: Payer: Medicaid Other | Admitting: Obstetrics and Gynecology

## 2023-08-08 ENCOUNTER — Encounter: Payer: Self-pay | Admitting: Obstetrics and Gynecology

## 2023-08-08 VITALS — BP 123/63 | HR 63 | Ht 66.5 in | Wt 176.0 lb

## 2023-08-08 DIAGNOSIS — Z4689 Encounter for fitting and adjustment of other specified devices: Secondary | ICD-10-CM

## 2023-08-08 DIAGNOSIS — N812 Incomplete uterovaginal prolapse: Secondary | ICD-10-CM

## 2023-08-08 DIAGNOSIS — N3941 Urge incontinence: Secondary | ICD-10-CM

## 2023-08-08 NOTE — Telephone Encounter (Signed)
Noted  

## 2023-08-12 ENCOUNTER — Ambulatory Visit: Payer: Medicaid Other

## 2023-08-12 DIAGNOSIS — E538 Deficiency of other specified B group vitamins: Secondary | ICD-10-CM

## 2023-08-12 MED ORDER — CYANOCOBALAMIN 1000 MCG/ML IJ SOLN
1000.0000 ug | Freq: Once | INTRAMUSCULAR | Status: AC
Start: 2023-08-12 — End: 2023-08-12
  Administered 2023-08-12: 1000 ug via INTRAMUSCULAR

## 2023-08-12 NOTE — Progress Notes (Signed)
Pt presented for their vitamin B12 injection. Pt was identified through two identifiers. Pt tolerated shot well in their left  deltoid.  

## 2023-09-12 ENCOUNTER — Ambulatory Visit: Payer: Medicaid Other

## 2023-09-12 DIAGNOSIS — E538 Deficiency of other specified B group vitamins: Secondary | ICD-10-CM

## 2023-09-12 MED ORDER — CYANOCOBALAMIN 1000 MCG/ML IJ SOLN
1000.0000 ug | Freq: Once | INTRAMUSCULAR | Status: AC
Start: 2023-09-12 — End: 2023-09-12
  Administered 2023-09-12: 1000 ug via INTRAMUSCULAR

## 2023-09-12 NOTE — Progress Notes (Signed)
Pt presented for their vitamin B12 injection. Pt was identified through two identifiers. Pt tolerated shot well in their right deltoid.  

## 2023-09-25 ENCOUNTER — Ambulatory Visit: Payer: Medicaid Other | Admitting: Obstetrics and Gynecology

## 2023-09-25 ENCOUNTER — Encounter: Payer: Self-pay | Admitting: Obstetrics and Gynecology

## 2023-09-25 VITALS — BP 113/57 | HR 62 | Resp 16 | Ht 66.5 in | Wt 175.9 lb

## 2023-09-25 DIAGNOSIS — Z4689 Encounter for fitting and adjustment of other specified devices: Secondary | ICD-10-CM

## 2023-09-25 DIAGNOSIS — N812 Incomplete uterovaginal prolapse: Secondary | ICD-10-CM | POA: Diagnosis not present

## 2023-09-25 NOTE — Patient Instructions (Signed)
How to Use a Vaginal Pessary  A vaginal pessary is a removable device that is placed into your vagina to support pelvic organs that droop. These organs include your uterus, bladder, and rectum. When your pelvic organs drop down into your vagina, it causes a condition called pelvic organ prolapse (POP). A pessary may be an alternative to surgery for women with POP. It may help women who leak urine when they strain or exercise (stress incontinence). This is a symptom of POP. A vaginal pessary may also be a temporary treatment for stress incontinence during pregnancy. There are several types of pessaries. All types are usually made of silicone. You can insert and remove some on your own. Other types must be inserted and removed by your health care provider at office visits. The reason you are using a pessary and the severity of your condition will determine which one is best for you. It is also important to find the right size. A pessary that is too small may fall out. A pessary that is too large may cause pain or discomfort. Your health care provider will do a physical exam to find the correct size and fit for your pessary. It may take several appointments to find the best fit for you. If you can be fit with the type of pessary that you can insert, remove, and clean yourself, your health care provider will teach you how to use your pessary at home. You may have checkups every few months. If you have the type of pessary that needs to be inserted and removed by your health care provider, you will have appointments every few months to have the pessary removed, cleaned, and replaced. What are the risks? When properly fitted and cared for, risks of using a vaginal pessary can be small. However, there can be problems that may include: Vaginal discharge. Vaginal bleeding. A bad smell coming from your vagina. Scraping of the skin inside your vagina. How to use your pessary Follow your health care provider's  instructions for using a pessary. These instructions may vary, depending on the type of pessary you have. To insert a pessary: Wash your hands with soap and water for at least 20 seconds. Squeeze or fold the pessary in half and lubricate the tip with a water-based lubricant. Insert the pessary into your vagina. It will unfold and provide support. To remove the pessary, gently tug it out of your vagina. You can remove the pessary every night or after several days. You can also remove it to have sex. How to care for your pessary If you have a pessary that you can remove: Clean your pessary with soap and water. Rinse well. Dry it completely before inserting it back into your vagina. Follow these instructions at home: Take over-the-counter and prescription medicines only as told by your health care provider. Your health care provider may prescribe an estrogen cream to moisten your vagina. Keep all follow-up visits. This is important. Contact a health care provider if: You feel any pain or discomfort when your pessary is in place. You continue to have stress incontinence. You have trouble keeping your pessary from falling out. You have an unusual vaginal discharge that is blood-tinged or smells bad. Summary A vaginal pessary is a removable device that is placed into your vagina to support pelvic organs that droop. This condition is called pelvic organ prolapse (POP). There are several types of pessaries. Some you can insert and remove on your own. Others must be inserted and  removed by your health care provider. The best type for you depends on the reason you are using a pessary and the severity of your condition. It is also important to find the right size. If you can use the type that you insert and remove on your own, your health care provider will teach you how to use it and schedule checkups every few months. If you have the type that needs to be inserted and removed by your health care  provider, you will have regular appointments to have your pessary removed, cleaned, and replaced. This information is not intended to replace advice given to you by your health care provider. Make sure you discuss any questions you have with your health care provider. Document Revised: 02/11/2020 Document Reviewed: 02/11/2020 Elsevier Patient Education  2024 [employer].

## 2023-09-25 NOTE — Progress Notes (Signed)
    GYNECOLOGY PROGRESS NOTE  Subjective:    Patient ID: Megan Harper, female    DOB: [birth year], 63 y.o.   MRN: [id number]  HPI  Patient is a 63 y.o. G33P2103 female who presents for concerns with Pessary.  Patient has a Grade 2-3 cystocele with some uterine descensus (Grade 2). She has a Engineer, maintenance pessary now, but does not have it in. She took the pessary out to clean it about 1 month ago. She said it hurt really bad when she took it out and she had some bleeding. She left it out for about 2 weeks, so that it would heal. She put it back in and when she took it out to clean it again the same thing happened again. Pessary goes in fine, but can't get it out.  She thinks she wants to go back to her old pessary which was a size 3 ring with support (changed due to issues with expulsion with abdominal pressure).  The following portions of the patient's history were reviewed and updated as appropriate: allergies, current medications, past family history, past medical history, past social history, past surgical history, and problem list.  Review of Systems Pertinent items are noted in HPI.   Objective:   Blood pressure (!) 113/57, pulse 62, resp. rate 16, height 5' 6.5" (1.689 m), weight 175 lb 14.4 oz (79.8 kg). Body mass index is 27.97 kg/m. General appearance: alert, cooperative, and no distress Abdomen: soft, non-tender; bowel sounds normal; no masses,  no organomegaly Pelvic: external genitalia normal, rectovaginal septum normal.  Vagina without discharge.  No vaginal wall lacerations or abrasions. Cervix appears normal, no lesions or tenderness. Bimanual exam not performed.    Assessment:   1. Pessary maintenance   2. Cystocele with incomplete uterovaginal prolapse      Plan:   - Offered patient option of use of local estrogen therapy to helkp with maintenance of new pessary and ease insertion, vs returning to her old pessary and being aware of when pessary has descenced. Patient would  like to utilize her old pessary, notes that she is afraid of use of hormonal therapy due to the risk of cancer. Advised that this risk was very low, however patient still declines use. Has old pessary at home, will reinsert once she returns home.   - To f/u in 6 months for pessary check.     Hildred Laser, MD [facility]

## 2023-10-14 ENCOUNTER — Ambulatory Visit (INDEPENDENT_AMBULATORY_CARE_PROVIDER_SITE_OTHER): Payer: Medicaid Other

## 2023-10-14 DIAGNOSIS — E538 Deficiency of other specified B group vitamins: Secondary | ICD-10-CM | POA: Diagnosis not present

## 2023-10-14 MED ORDER — CYANOCOBALAMIN 1000 MCG/ML IJ SOLN
1000.0000 ug | Freq: Once | INTRAMUSCULAR | Status: AC
Start: 2023-10-14 — End: 2023-10-14
  Administered 2023-10-14: 1000 ug via INTRAMUSCULAR

## 2023-10-14 NOTE — Progress Notes (Signed)
 Pt presented for their vitamin B12 injection. Pt was identified through two identifiers. Pt tolerated shot well in their left deltoid.

## 2023-10-21 ENCOUNTER — Other Ambulatory Visit: Payer: Self-pay | Admitting: Family

## 2023-10-21 MED ORDER — LORATADINE 10 MG PO TABS: 10.0000 mg | ORAL_TABLET | Freq: Every day | ORAL | 3 refills | Status: AC

## 2023-10-21 NOTE — Telephone Encounter (Signed)
 Copied from CRM [id number]. Topic: Clinical - Medication Refill >> Oct 21, 2023  2:33 PM Orinda Kenner C wrote: Most Recent Primary Care Visit:  Provider: Donavan Foil  Department: [facility]  Visit Type: NURSE VISIT  Date: 10/14/2023  Medication:  loratadine (CLARITIN) 10 MG tablet  Has the patient contacted their pharmacy? No (Agent: If no, request that the patient contact the pharmacy for the refill. If patient does not wish to contact the pharmacy document the reason why and proceed with request.) (Agent: If yes, when and what did the pharmacy advise?)  Is this the correct pharmacy for this prescription? Yes If no, delete pharmacy and type the correct one.  This is the patient's preferred pharmacy:  [facility] [id number] [place], [place] - [address] [place] [address] Phone: [phone] Fax: [phone]   Has the prescription been filled recently? No  Is the patient out of the medication? Yes  Has the patient been seen for an appointment in the last year OR does the patient have an upcoming appointment? Yes  Can we respond through MyChart? No, please call [phone]  Agent: Please be advised that Rx refills may take up to 3 business days. We ask that you follow-up with your pharmacy.

## 2023-10-21 NOTE — Telephone Encounter (Signed)
 Last Fill: Unknown  Last OV: 07/31/23 Next OV: 01/30/24  Routing to provider for review/authorization.

## 2023-11-10 NOTE — Progress Notes (Unsigned)
Pt received B12 injection in right deltoid. Pt tolerated it well with no complaints or concerns. 

## 2023-11-11 ENCOUNTER — Ambulatory Visit (INDEPENDENT_AMBULATORY_CARE_PROVIDER_SITE_OTHER): Payer: Medicaid Other | Admitting: *Deleted

## 2023-11-11 ENCOUNTER — Ambulatory Visit: Payer: Medicaid Other

## 2023-11-11 ENCOUNTER — Inpatient Hospital Stay: Payer: Medicaid Other | Attending: Internal Medicine

## 2023-11-11 DIAGNOSIS — Z9851 Tubal ligation status: Secondary | ICD-10-CM | POA: Diagnosis not present

## 2023-11-11 DIAGNOSIS — Z8249 Family history of ischemic heart disease and other diseases of the circulatory system: Secondary | ICD-10-CM | POA: Diagnosis not present

## 2023-11-11 DIAGNOSIS — Z833 Family history of diabetes mellitus: Secondary | ICD-10-CM | POA: Insufficient documentation

## 2023-11-11 DIAGNOSIS — Z8349 Family history of other endocrine, nutritional and metabolic diseases: Secondary | ICD-10-CM | POA: Diagnosis not present

## 2023-11-11 DIAGNOSIS — Z887 Allergy status to serum and vaccine status: Secondary | ICD-10-CM | POA: Insufficient documentation

## 2023-11-11 DIAGNOSIS — Z808 Family history of malignant neoplasm of other organs or systems: Secondary | ICD-10-CM | POA: Diagnosis not present

## 2023-11-11 DIAGNOSIS — Z79899 Other long term (current) drug therapy: Secondary | ICD-10-CM | POA: Insufficient documentation

## 2023-11-11 DIAGNOSIS — D472 Monoclonal gammopathy: Secondary | ICD-10-CM | POA: Diagnosis not present

## 2023-11-11 DIAGNOSIS — G8929 Other chronic pain: Secondary | ICD-10-CM | POA: Diagnosis not present

## 2023-11-11 DIAGNOSIS — Z8052 Family history of malignant neoplasm of bladder: Secondary | ICD-10-CM | POA: Insufficient documentation

## 2023-11-11 DIAGNOSIS — M549 Dorsalgia, unspecified: Secondary | ICD-10-CM | POA: Diagnosis not present

## 2023-11-11 DIAGNOSIS — R202 Paresthesia of skin: Secondary | ICD-10-CM | POA: Diagnosis not present

## 2023-11-11 DIAGNOSIS — Z8042 Family history of malignant neoplasm of prostate: Secondary | ICD-10-CM | POA: Insufficient documentation

## 2023-11-11 DIAGNOSIS — Z800 Family history of malignant neoplasm of digestive organs: Secondary | ICD-10-CM | POA: Insufficient documentation

## 2023-11-11 DIAGNOSIS — Z801 Family history of malignant neoplasm of trachea, bronchus and lung: Secondary | ICD-10-CM | POA: Diagnosis not present

## 2023-11-11 DIAGNOSIS — Z807 Family history of other malignant neoplasms of lymphoid, hematopoietic and related tissues: Secondary | ICD-10-CM | POA: Insufficient documentation

## 2023-11-11 DIAGNOSIS — Z87891 Personal history of nicotine dependence: Secondary | ICD-10-CM | POA: Insufficient documentation

## 2023-11-11 DIAGNOSIS — M1990 Unspecified osteoarthritis, unspecified site: Secondary | ICD-10-CM | POA: Diagnosis not present

## 2023-11-11 DIAGNOSIS — Z8049 Family history of malignant neoplasm of other genital organs: Secondary | ICD-10-CM | POA: Insufficient documentation

## 2023-11-11 DIAGNOSIS — R200 Anesthesia of skin: Secondary | ICD-10-CM | POA: Diagnosis not present

## 2023-11-11 DIAGNOSIS — Z881 Allergy status to other antibiotic agents status: Secondary | ICD-10-CM | POA: Diagnosis not present

## 2023-11-11 DIAGNOSIS — E538 Deficiency of other specified B group vitamins: Secondary | ICD-10-CM

## 2023-11-11 LAB — CBC WITH DIFFERENTIAL (CANCER CENTER ONLY)
Abs Immature Granulocytes: 0.01 10*3/uL (ref 0.00–0.07)
Basophils Absolute: 0.1 10*3/uL (ref 0.0–0.1)
Basophils Relative: 2 %
Eosinophils Absolute: 0.1 10*3/uL (ref 0.0–0.5)
Eosinophils Relative: 3 %
HCT: 40.6 % (ref 36.0–46.0)
Hemoglobin: 14.1 g/dL (ref 12.0–15.0)
Immature Granulocytes: 0 %
Lymphocytes Relative: 24 %
Lymphs Abs: 1.2 10*3/uL (ref 0.7–4.0)
MCH: 33.1 pg (ref 26.0–34.0)
MCHC: 34.7 g/dL (ref 30.0–36.0)
MCV: 95.3 fL (ref 80.0–100.0)
Monocytes Absolute: 0.3 10*3/uL (ref 0.1–1.0)
Monocytes Relative: 7 %
Neutro Abs: 3.2 10*3/uL (ref 1.7–7.7)
Neutrophils Relative %: 64 %
Platelet Count: 285 10*3/uL (ref 150–400)
RBC: 4.26 MIL/uL (ref 3.87–5.11)
RDW: 11.8 % (ref 11.5–15.5)
WBC Count: 5 10*3/uL (ref 4.0–10.5)
nRBC: 0 % (ref 0.0–0.2)

## 2023-11-11 LAB — CMP (CANCER CENTER ONLY)
ALT: 19 U/L (ref 0–44)
AST: 25 U/L (ref 15–41)
Albumin: 4.1 g/dL (ref 3.5–5.0)
Alkaline Phosphatase: 65 U/L (ref 38–126)
Anion gap: 8 (ref 5–15)
BUN: 13 mg/dL (ref 8–23)
CO2: 26 mmol/L (ref 22–32)
Calcium: 9 mg/dL (ref 8.9–10.3)
Chloride: 101 mmol/L (ref 98–111)
Creatinine: 0.71 mg/dL (ref 0.44–1.00)
GFR, Estimated: >60 mL/min (ref >60)
Glucose, Bld: 117 mg/dL — ABNORMAL HIGH (ref 70–99)
Potassium: 3.5 mmol/L (ref 3.5–5.1)
Sodium: 135 mmol/L (ref 135–145)
Total Bilirubin: 0.8 mg/dL (ref 0.0–1.2)
Total Protein: 7.4 g/dL (ref 6.5–8.1)

## 2023-11-11 MED ORDER — CYANOCOBALAMIN 1000 MCG/ML IJ SOLN
1000.0000 ug | Freq: Once | INTRAMUSCULAR | Status: AC
Start: 2023-11-11 — End: 2023-11-11
  Administered 2023-11-11: 1000 ug via INTRAMUSCULAR

## 2023-11-12 LAB — KAPPA/LAMBDA LIGHT CHAINS
Kappa free light chain: 25.9 mg/L — ABNORMAL HIGH (ref 3.3–19.4)
Kappa, lambda light chain ratio: 1.99 — ABNORMAL HIGH (ref 0.26–1.65)
Lambda free light chains: 13 mg/L (ref 5.7–26.3)

## 2023-11-13 LAB — MULTIPLE MYELOMA PANEL, SERUM
Albumin SerPl Elph-Mcnc: 3.5 g/dL (ref 2.9–4.4)
Albumin/Glob SerPl: 1.1 (ref 0.7–1.7)
Alpha 1: 0.3 g/dL (ref 0.0–0.4)
Alpha2 Glob SerPl Elph-Mcnc: 0.7 g/dL (ref 0.4–1.0)
B-Globulin SerPl Elph-Mcnc: 1.1 g/dL (ref 0.7–1.3)
Gamma Glob SerPl Elph-Mcnc: 1.3 g/dL (ref 0.4–1.8)
Globulin, Total: 3.3 g/dL (ref 2.2–3.9)
IgA: 206 mg/dL (ref 87–352)
IgG (Immunoglobin G), Serum: 1377 mg/dL (ref 586–1602)
IgM (Immunoglobulin M), Srm: 83 mg/dL (ref 26–217)
M Protein SerPl Elph-Mcnc: 0.4 g/dL — ABNORMAL HIGH
Total Protein ELP: 6.8 g/dL (ref 6.0–8.5)

## 2023-11-25 ENCOUNTER — Inpatient Hospital Stay (HOSPITAL_BASED_OUTPATIENT_CLINIC_OR_DEPARTMENT_OTHER): Payer: Medicaid Other | Admitting: Internal Medicine

## 2023-11-25 ENCOUNTER — Encounter: Payer: Self-pay | Admitting: Internal Medicine

## 2023-11-25 DIAGNOSIS — D472 Monoclonal gammopathy: Secondary | ICD-10-CM

## 2023-11-25 DIAGNOSIS — R202 Paresthesia of skin: Secondary | ICD-10-CM | POA: Diagnosis not present

## 2023-11-25 DIAGNOSIS — Z8349 Family history of other endocrine, nutritional and metabolic diseases: Secondary | ICD-10-CM | POA: Diagnosis not present

## 2023-11-25 DIAGNOSIS — M549 Dorsalgia, unspecified: Secondary | ICD-10-CM | POA: Diagnosis not present

## 2023-11-25 DIAGNOSIS — Z801 Family history of malignant neoplasm of trachea, bronchus and lung: Secondary | ICD-10-CM | POA: Diagnosis not present

## 2023-11-25 DIAGNOSIS — Z833 Family history of diabetes mellitus: Secondary | ICD-10-CM | POA: Diagnosis not present

## 2023-11-25 DIAGNOSIS — M1990 Unspecified osteoarthritis, unspecified site: Secondary | ICD-10-CM | POA: Diagnosis not present

## 2023-11-25 DIAGNOSIS — Z881 Allergy status to other antibiotic agents status: Secondary | ICD-10-CM | POA: Diagnosis not present

## 2023-11-25 DIAGNOSIS — Z87891 Personal history of nicotine dependence: Secondary | ICD-10-CM | POA: Diagnosis not present

## 2023-11-25 DIAGNOSIS — Z8052 Family history of malignant neoplasm of bladder: Secondary | ICD-10-CM | POA: Diagnosis not present

## 2023-11-25 DIAGNOSIS — Z8249 Family history of ischemic heart disease and other diseases of the circulatory system: Secondary | ICD-10-CM | POA: Diagnosis not present

## 2023-11-25 DIAGNOSIS — Z79899 Other long term (current) drug therapy: Secondary | ICD-10-CM | POA: Diagnosis not present

## 2023-11-25 DIAGNOSIS — R200 Anesthesia of skin: Secondary | ICD-10-CM | POA: Diagnosis not present

## 2023-11-25 DIAGNOSIS — Z887 Allergy status to serum and vaccine status: Secondary | ICD-10-CM | POA: Diagnosis not present

## 2023-11-25 DIAGNOSIS — G8929 Other chronic pain: Secondary | ICD-10-CM | POA: Diagnosis not present

## 2023-11-25 DIAGNOSIS — Z800 Family history of malignant neoplasm of digestive organs: Secondary | ICD-10-CM | POA: Diagnosis not present

## 2023-11-25 DIAGNOSIS — Z8049 Family history of malignant neoplasm of other genital organs: Secondary | ICD-10-CM | POA: Diagnosis not present

## 2023-11-25 DIAGNOSIS — Z9851 Tubal ligation status: Secondary | ICD-10-CM | POA: Diagnosis not present

## 2023-11-25 NOTE — Progress Notes (Signed)
 12/03/22 CT chest, 12/06/22 mammogram diagnostic, u/s thyroid 12/31/22, 02/26/23 screening mammogram.  Started VitB 12 injections, [facility].

## 2023-11-25 NOTE — Assessment & Plan Note (Addendum)
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 MARCH 2025- 0.4 gm/dl;  K/L= 2.0- stable.    # Discussed that in general the risk of transformation to myeloma is about 1 %/year.  Overall low risk of progression to myeloma.  Continue monitoring from 6 months to 12 months. Pt in agreement.   #Joint pain osteoarthritis-clinically not suggestive of multiple myeloma.  Reviewed x-rays from 2022-cervical spine/hips-no lytic lesions noted. Recommend follow up with rheumatology- stable.  # DISPOSITION: # follow up in 12 months- MD; 2 week prior-labs- cbc/cmp;MM panel; k/l light chains-Dr.B

## 2023-11-25 NOTE — Progress Notes (Signed)
 [facility] CONSULT NOTE  Patient Care Team: Allegra Grana, FNP as PCP - General (Family Medicine) Earna Coder, MD as Consulting Physician (Hematology and Oncology)  CHIEF COMPLAINTS/PURPOSE OF CONSULTATION: Monoclonal gammopathy  HEMATOLOGY HISTORY  # IgG kappa [0.4 gm/ld; JAN 2023- Rheumatology; [place]]- NOV 2022- cbc/cmp;WNL.   #Osteoarthritis [Dr.Deweshar; [place]]  HISTORY OF PRESENTING ILLNESS: Alone.  Ambulating independently.  Megan Harper 63 y.o.  female is here for follow-up of monoclonal gammopathy.  Patient continues to have chronic joint pains not any worse. No significant fatigue.  Mild tingling and numbness in extremities.  Review of Systems  Constitutional:  Negative for chills, diaphoresis, fever, malaise/fatigue and weight loss.  HENT:  Negative for nosebleeds and sore throat.   Eyes:  Negative for double vision.  Respiratory:  Negative for cough, hemoptysis, sputum production, shortness of breath and wheezing.   Cardiovascular:  Negative for chest pain, palpitations, orthopnea and leg swelling.  Gastrointestinal:  Negative for abdominal pain, blood in stool, constipation, diarrhea, heartburn, melena, nausea and vomiting.  Genitourinary:  Negative for dysuria, frequency and urgency.  Musculoskeletal:  Positive for back pain and joint pain.  Skin: Negative.  Negative for itching and rash.  Neurological:  Negative for dizziness, tingling, focal weakness, weakness and headaches.  Endo/Heme/Allergies:  Does not bruise/bleed easily.  Psychiatric/Behavioral:  Negative for depression. The patient is not nervous/anxious and does not have insomnia.     MEDICAL HISTORY:  Past Medical History:  Diagnosis Date   Abnormal SPEP    Anxiety    Arthralgia    Cervical dysplasia    COPD (chronic obstructive pulmonary disease) (HCC)    Emphysema of lung (HCC)    Endometriosis    Fibrocystic breast disease    GERD (gastroesophageal reflux disease)     Osteoporosis    Thyroid nodule    Vitamin D insufficiency     SURGICAL HISTORY: Past Surgical History:  Procedure Laterality Date   laproscopy for endometriosis     THYROID LOBECTOMY Right 03/08/2010   TUBAL LIGATION  01/23/1986    SOCIAL HISTORY: Social History   Socioeconomic History   Marital status: Married    Spouse name: Not on file   Number of children: 3   Years of education: Not on file   Highest education level: Not on file  Occupational History   Not on file  Tobacco Use   Smoking status: Former    Current packs/day: 0.00    Average packs/day: 2.5 packs/day for 31.0 years (77.5 ttl pk-yrs)    Types: Cigarettes    Start date: 09/17/1978    Quit date: 09/17/2009    Years since quitting: 14.1   Smokeless tobacco: Never  Vaping Use   Vaping status: Never Used  Substance and Sexual Activity   Alcohol use: No   Drug use: No   Sexual activity: Yes    Birth control/protection: Surgical, Post-menopausal    Comment: Tubal  Other Topics Concern   Not on file  Social History Narrative   Lives in [place]; with husband- 3 dogs. Quit smoking 2010. No alcohol. Used to work at [employer].    Social Drivers of Corporate investment banker Strain: Not on file  Food Insecurity: Not on file  Transportation Needs: Not on file  Physical Activity: Not on file  Stress: Not on file  Social Connections: Not on file  Intimate Partner Violence: Not on file    FAMILY HISTORY: Family History  Problem Relation  Age of Onset   Uterine cancer Mother    Lung cancer Father        smoked   Bladder Cancer Sister    Esophageal cancer Maternal Aunt    Lymphoma Maternal Aunt    Kidney cancer Maternal Uncle    Prostate cancer Maternal Uncle    Heart disease Maternal Uncle    Heart disease Maternal Grandmother    Hypertension Maternal Grandmother    Thyroid disease Maternal Grandmother    Throat cancer Maternal Grandmother    Prostate cancer Maternal Grandfather     Hypertension Maternal Grandfather    Colon cancer Maternal Grandfather    Kidney cancer Maternal Grandfather    Heart disease Paternal Grandmother    Diabetes Paternal Grandmother    Breast cancer Neg Hx     ALLERGIES:  is allergic to ceclor [cefaclor], influenza vaccines, and pantoprazole.  MEDICATIONS:  Current Outpatient Medications  Medication Sig Dispense Refill   albuterol (VENTOLIN HFA) 108 (90 Base) MCG/ACT inhaler Inhale 2 puffs into the lungs every 6 (six) hours as needed for wheezing or shortness of breath. 18 g 3   fluticasone (FLONASE) 50 MCG/ACT nasal spray Place 2 sprays into both nostrils daily. 16 g 6   loratadine (CLARITIN) 10 MG tablet Take 1 tablet (10 mg total) by mouth daily. 90 tablet 3   meloxicam (MOBIC) 7.5 MG tablet Take 1 tablet (7.5 mg total) by mouth daily as needed for pain. 30 tablet 1   mometasone (ELOCON) 0.1 % cream Use pea-sized amount in bilateral ear for itching as needed 15 g 1   omeprazole (PRILOSEC) 20 MG capsule TAKE 1 CAPSULE BY MOUTH DAILY AS NEEDED FOR EPIGASTRIC BURNING 90 capsule 1   Oxyquinoline-Sod Lauryl Sulf (TRIMO-SAN) 0.025-0.01 % GEL Place 1 Applicatorful vaginally once a week. 113.4 g 3   No current facility-administered medications for this visit.      PHYSICAL EXAMINATION:   Vitals:   11/25/23 1524  BP: 122/65  Pulse: 69  Resp: (!) 24  Temp: 98.6 F (37 C)  SpO2: 97%    Filed Weights   11/25/23 1524  Weight: 174 lb 3.2 oz (79 kg)     Physical Exam Vitals and nursing note reviewed.  HENT:     Head: Normocephalic and atraumatic.     Mouth/Throat:     Pharynx: Oropharynx is clear.  Eyes:     Extraocular Movements: Extraocular movements intact.     Pupils: Pupils are equal, round, and reactive to light.  Cardiovascular:     Rate and Rhythm: Normal rate and regular rhythm.  Pulmonary:     Comments: Decreased breath sounds bilaterally.  Abdominal:     Palpations: Abdomen is soft.  Musculoskeletal:         General: Normal range of motion.     Cervical back: Normal range of motion.  Skin:    General: Skin is warm.  Neurological:     General: No focal deficit present.     Mental Status: Megan Harper is alert and oriented to person, place, and time.  Psychiatric:        Behavior: Behavior normal.        Judgment: Judgment normal.     LABORATORY DATA:  I have reviewed the data as listed Lab Results  Component Value Date   WBC 5.0 11/11/2023   HGB 14.1 11/11/2023   HCT 40.6 11/11/2023   MCV 95.3 11/11/2023   PLT 285 11/11/2023   Recent Labs  11/11/23 1134  NA 135  K 3.5  CL 101  CO2 26  GLUCOSE 117*  BUN 13  CREATININE 0.71  CALCIUM 9.0  GFRNONAA >60  PROT 7.4  ALBUMIN 4.1  AST 25  ALT 19  ALKPHOS 65  BILITOT 0.8     No results found.  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 MARCH 2025- 0.4 gm/dl;  K/L= 2.0- stable.    # Discussed that in general the risk of transformation to myeloma is about 1 %/year.  Overall low risk of progression to myeloma.  Continue monitoring from 6 months to 12 months. Pt in agreement.   #Joint pain osteoarthritis-clinically not suggestive of multiple myeloma.  Reviewed x-rays from 2022-cervical spine/hips-no lytic lesions noted. Recommend follow up with rheumatology- stable.  # DISPOSITION: # follow up in 12 months- MD; 2 week prior-labs- cbc/cmp;MM panel; k/l light chains-Dr.B   All questions were answered. The patient knows to call the clinic with any problems, questions or concerns.    Earna Coder,  MD 11/25/2023 3:44 PM

## 2023-11-27 ENCOUNTER — Other Ambulatory Visit: Payer: Self-pay | Admitting: Emergency Medicine

## 2023-11-27 DIAGNOSIS — Z87891 Personal history of nicotine dependence: Secondary | ICD-10-CM

## 2023-11-27 DIAGNOSIS — Z122 Encounter for screening for malignant neoplasm of respiratory organs: Secondary | ICD-10-CM

## 2023-11-27 NOTE — Progress Notes (Signed)
 ANNUAL PREVENTATIVE CARE GYNECOLOGY  ENCOUNTER NOTE  Subjective:       Megan Harper is a 63 y.o. [phone] female here for a routine annual gynecologic exam and pessary check. The patient is sexually active. The patient is not taking hormone replacement therapy. Patient denies post-menopausal vaginal bleeding. The patient wears seatbelts: yes. The patient participates in regular exercise: yes. Has the patient ever been transfused or tattooed?: no. The patient reports that there is not domestic violence in her life.  Current complaints: 1.  None   Gynecologic History No LMP recorded. Patient is postmenopausal. Contraception: post menopausal status Last Pap: 01/17/2022. Results were: abnormal Last mammogram: 02/26/2023. Results were: normal Last Colonoscopy: Last Colonoscopy: 09/27/2014 Last Dexa Scan: Never done   Obstetric History OB History  Gravida Para Term Preterm AB Living  3 3 2 1  3   SAB IAB Ectopic Multiple Live Births      3    # Outcome Date GA Lbr Len/2nd Weight Sex Type Anes PTL Lv  3 Preterm      Vag-Spont     2 Term      Vag-Spont     1 Term      Vag-Spont       Past Medical History:  Diagnosis Date   Abnormal SPEP    Anxiety    Arthralgia    Cervical dysplasia    COPD (chronic obstructive pulmonary disease) (HCC)    Emphysema of lung (HCC)    Endometriosis    Fibrocystic breast disease    GERD (gastroesophageal reflux disease)    Osteoporosis    Thyroid nodule    Vitamin D insufficiency     Family History  Problem Relation Age of Onset   Uterine cancer Mother    Lung cancer Father        smoked   Bladder Cancer Sister    Esophageal cancer Maternal Aunt    Lymphoma Maternal Aunt    Kidney cancer Maternal Uncle    Prostate cancer Maternal Uncle    Heart disease Maternal Uncle    Heart disease Maternal Grandmother    Hypertension Maternal Grandmother    Thyroid disease Maternal Grandmother    Throat cancer Maternal Grandmother    Prostate  cancer Maternal Grandfather    Hypertension Maternal Grandfather    Colon cancer Maternal Grandfather    Kidney cancer Maternal Grandfather    Heart disease Paternal Grandmother    Diabetes Paternal Grandmother    Breast cancer Neg Hx     Past Surgical History:  Procedure Laterality Date   laproscopy for endometriosis     THYROID LOBECTOMY Right 03/08/2010   TUBAL LIGATION  01/23/1986    Social History   Socioeconomic History   Marital status: Married    Spouse name: Not on file   Number of children: 3   Years of education: Not on file   Highest education level: Not on file  Occupational History   Not on file  Tobacco Use   Smoking status: Former    Current packs/day: 0.00    Average packs/day: 2.5 packs/day for 31.0 years (77.5 ttl pk-yrs)    Types: Cigarettes    Start date: 09/17/1978    Quit date: 09/17/2009    Years since quitting: 14.2   Smokeless tobacco: Never  Vaping Use   Vaping status: Never Used  Substance and Sexual Activity   Alcohol use: No   Drug use: No   Sexual activity: Yes  Birth control/protection: Surgical, Post-menopausal    Comment: Tubal  Other Topics Concern   Not on file  Social History Narrative   Lives in [place]; with husband- 3 dogs. Quit smoking 2010. No alcohol. Used to work at [employer].    Social Drivers of Corporate investment banker Strain: Not on file  Food Insecurity: Not on file  Transportation Needs: Not on file  Physical Activity: Not on file  Stress: Not on file  Social Connections: Not on file  Intimate Partner Violence: Not on file    Current Outpatient Medications on File Prior to Visit  Medication Sig Dispense Refill   albuterol (VENTOLIN HFA) 108 (90 Base) MCG/ACT inhaler Inhale 2 puffs into the lungs every 6 (six) hours as needed for wheezing or shortness of breath. 18 g 3   fluticasone (FLONASE) 50 MCG/ACT nasal spray Place 2 sprays into both nostrils daily. 16 g 6   loratadine (CLARITIN) 10 MG  tablet Take 1 tablet (10 mg total) by mouth daily. 90 tablet 3   meloxicam (MOBIC) 7.5 MG tablet Take 1 tablet (7.5 mg total) by mouth daily as needed for pain. 30 tablet 1   mometasone (ELOCON) 0.1 % cream Use pea-sized amount in bilateral ear for itching as needed 15 g 1   omeprazole (PRILOSEC) 20 MG capsule TAKE 1 CAPSULE BY MOUTH DAILY AS NEEDED FOR EPIGASTRIC BURNING 90 capsule 1   Oxyquinoline-Sod Lauryl Sulf (TRIMO-SAN) 0.025-0.01 % GEL Place 1 Applicatorful vaginally once a week. 113.4 g 3   No current facility-administered medications on file prior to visit.    Allergies  Allergen Reactions   Ceclor [Cefaclor] Other (See Comments)    Flu like symptoms   Influenza Vaccines Nausea And Vomiting    Fever, sweating eyes bloodshot nausea also   Pantoprazole Itching      Review of Systems ROS Review of Systems - General ROS: negative for - chills, fatigue, fever, hot flashes, night sweats, weight gain or weight loss Psychological ROS: negative for - anxiety, decreased libido, depression, mood swings, physical abuse or sexual abuse Ophthalmic ROS: negative for - blurry vision, eye pain or loss of vision ENT ROS: negative for - headaches, hearing change, visual changes or vocal changes Allergy and Immunology ROS: negative for - hives, itchy/watery eyes or seasonal allergies Hematological and Lymphatic ROS: negative for - bleeding problems, bruising, swollen lymph nodes or weight loss Endocrine ROS: negative for - galactorrhea, hair pattern changes, hot flashes, malaise/lethargy, mood swings, palpitations, polydipsia/polyuria, skin changes, temperature intolerance or unexpected weight changes Breast ROS: negative for - new or changing breast lumps or nipple discharge Respiratory ROS: negative for - cough or shortness of breath Cardiovascular ROS: negative for - chest pain, irregular heartbeat, palpitations or shortness of breath Gastrointestinal ROS: no abdominal pain, change in bowel  habits, or black or bloody stools Genito-Urinary ROS: no dysuria, trouble voiding, or hematuria Musculoskeletal ROS: negative for - joint pain or joint stiffness Neurological ROS: negative for - bowel and bladder control changes Dermatological ROS: negative for rash and skin lesion changes   Objective:   BP (!) 117/56   Pulse 66   Resp 16   Ht 5\' 6"  (1.676 m)   Wt 176 lb (79.8 kg)   BMI 28.41 kg/m     CONSTITUTIONAL: Well-developed, well-nourished female in no acute distress.  PSYCHIATRIC: Normal mood and affect. Normal behavior. Normal judgment and thought content. NEUROLGIC: Alert and oriented to person, place, and time. Normal muscle tone coordination.  No cranial nerve deficit noted. HENT:  Normocephalic, atraumatic, External right and left ear normal. Oropharynx is clear and moist EYES: Conjunctivae and EOM are normal. Pupils are equal, round, and reactive to light. No scleral icterus.  NECK: Normal range of motion, supple, no masses.  Normal thyroid.  SKIN: Skin is warm and dry. No rash noted. Not diaphoretic. No erythema. No pallor. CARDIOVASCULAR: Normal heart rate noted, regular rhythm, no murmur. RESPIRATORY: Clear to auscultation bilaterally. Effort and breath sounds normal, no problems with respiration noted. BREASTS: Symmetric in size. No masses, skin changes, nipple drainage, or lymphadenopathy. ABDOMEN: Soft, normal bowel sounds, no distention noted.  No tenderness, rebound or guarding.  BLADDER: Normal PELVIC:  Bladder no bladder distension noted  Urethra: normal appearing urethra with no masses, tenderness or lesions  Vulva: normal appearing vulva with no masses, tenderness or lesions.   Vagina: mildly atrophic, no discharge, no lesions. Pessary not in situ.   Cervix: normal appearing cervix without discharge or lesions  Uterus: uterus is normal size, shape, consistency and nontender  Adnexa: normal adnexa in size, nontender and no masses  RV: External Exam NormaI,  No Rectal Masses, and Normal Sphincter tone  MUSCULOSKELETAL: Normal range of motion. No tenderness.  No cyanosis, clubbing, or edema.  2+ distal pulses. LYMPHATIC: No Axillary, Supraclavicular, or Inguinal Adenopathy.   Labs: Lab Results  Component Value Date   WBC 5.0 11/11/2023   HGB 14.1 11/11/2023   HCT 40.6 11/11/2023   MCV 95.3 11/11/2023   PLT 285 11/11/2023    Lab Results  Component Value Date   CREATININE 0.71 11/11/2023   BUN 13 11/11/2023   NA 135 11/11/2023   K 3.5 11/11/2023   CL 101 11/11/2023   CO2 26 11/11/2023    Lab Results  Component Value Date   ALT 19 11/11/2023   AST 25 11/11/2023   ALKPHOS 65 11/11/2023   BILITOT 0.8 11/11/2023    Lab Results  Component Value Date   CHOL 187 11/19/2022   HDL 57.70 11/19/2022   LDLCALC 118 (H) 11/19/2022   TRIG 56.0 11/19/2022   CHOLHDL 3 11/19/2022    Lab Results  Component Value Date   TSH 2.59 07/31/2023    Lab Results  Component Value Date   HGBA1C 5.7 11/19/2022     Assessment:   1. Encounter for well woman exam with routine gynecological exam   2. Encounter for screening mammogram for malignant neoplasm of breast   3. Cervical cancer screening   4. Cystocele with incomplete uterovaginal prolapse      Plan:  - Pap:  Up to date, however patient desires repeat today due to family history. Ordered.  - Mammogram:  Up to date.  - Colon Screening:   UTD.  - Labs:  UTD .  - Routine preventative health maintenance measures emphasized:  Self Breast Exam, Exercise/Diet/Weight control, and Stress Management - Continue maintenance of cystocele with uterine prolapse with pessary. Patient self-maintains.  - Return to Clinic - 1 Year for physical. Follow up in 6 months for pessary check.    Hildred Laser, MD [facility]

## 2023-11-27 NOTE — Patient Instructions (Signed)
 Preventive Care 39-62 Years Old, Female Preventive care refers to lifestyle choices and visits with your health care provider that can promote health and wellness. Preventive care visits are also called wellness exams. What can I expect for my preventive care visit? Counseling Your health care provider may ask you questions about your: Medical history, including: Past medical problems. Family medical history. Pregnancy history. Current health, including: Menstrual cycle. Method of birth control. Emotional well-being. Home life and relationship well-being. Sexual activity and sexual health. Lifestyle, including: Alcohol, nicotine or tobacco, and drug use. Access to firearms. Diet, exercise, and sleep habits. Work and work Astronomer. Sunscreen use. Safety issues such as seatbelt and bike helmet use. Physical exam Your health care provider will check your: Height and weight. These may be used to calculate your BMI (body mass index). BMI is a measurement that tells if you are at a healthy weight. Waist circumference. This measures the distance around your waistline. This measurement also tells if you are at a healthy weight and may help predict your risk of certain diseases, such as type 2 diabetes and high blood pressure. Heart rate and blood pressure. Body temperature. Skin for abnormal spots. What immunizations do I need?  Vaccines are usually given at various ages, according to a schedule. Your health care provider will recommend vaccines for you based on your age, medical history, and lifestyle or other factors, such as travel or where you work. What tests do I need? Screening Your health care provider may recommend screening tests for certain conditions. This may include: Lipid and cholesterol levels. Diabetes screening. This is done by checking your blood sugar (glucose) after you have not eaten for a while (fasting). Pelvic exam and Pap test. Hepatitis B test. Hepatitis C  test. HIV (human immunodeficiency virus) test. STI (sexually transmitted infection) testing, if you are at risk. Lung cancer screening. Colorectal cancer screening. Mammogram. Talk with your health care provider about when you should start having regular mammograms. This may depend on whether you have a family history of breast cancer. BRCA-related cancer screening. This may be done if you have a family history of breast, ovarian, tubal, or peritoneal cancers. Bone density scan. This is done to screen for osteoporosis. Talk with your health care provider about your test results, treatment options, and if necessary, the need for more tests. Follow these instructions at home: Eating and drinking  Eat a diet that includes fresh fruits and vegetables, whole grains, lean protein, and low-fat dairy products. Take vitamin and mineral supplements as recommended by your health care provider. Do not drink alcohol if: Your health care provider tells you not to drink. You are pregnant, may be pregnant, or are planning to become pregnant. If you drink alcohol: Limit how much you have to 0-1 drink a day. Know how much alcohol is in your drink. In the [place] one drink equals one 12 oz bottle of beer (355 mL), one 5 oz glass of wine (148 mL), or one 1 oz glass of hard liquor (44 mL). Lifestyle Brush your teeth every morning and night with fluoride toothpaste. Floss one time each day. Exercise for at least 30 minutes 5 or more days each week. Do not use any products that contain nicotine or tobacco. These products include cigarettes, chewing tobacco, and vaping devices, such as e-cigarettes. If you need help quitting, ask your health care provider. Do not use drugs. If you are sexually active, practice safe sex. Use a condom or other form of protection to  prevent STIs. If you do not wish to become pregnant, use a form of birth control. If you plan to become pregnant, see your health care provider for a  prepregnancy visit. Take aspirin only as told by your health care provider. Make sure that you understand how much to take and what form to take. Work with your health care provider to find out whether it is safe and beneficial for you to take aspirin daily. Find healthy ways to manage stress, such as: Meditation, yoga, or listening to music. Journaling. Talking to a trusted person. Spending time with friends and family. Minimize exposure to UV radiation to reduce your risk of skin cancer. Safety Always wear your seat belt while driving or riding in a vehicle. Do not drive: If you have been drinking alcohol. Do not ride with someone who has been drinking. When you are tired or distracted. While texting. If you have been using any mind-altering substances or drugs. Wear a helmet and other protective equipment during sports activities. If you have firearms in your house, make sure you follow all gun safety procedures. Seek help if you have been physically or sexually abused. What's next? Visit your health care provider once a year for an annual wellness visit. Ask your health care provider how often you should have your eyes and teeth checked. Stay up to date on all vaccines. This information is not intended to replace advice given to you by your health care provider. Make sure you discuss any questions you have with your health care provider. Document Revised: 02/08/2021 Document Reviewed: 02/08/2021 Elsevier Patient Education  2024 [employer] Inc. Breast Self-Awareness Breast self-awareness is knowing how your breasts look and feel. You need to: Check your breasts on a regular basis. Tell your doctor about any changes. Become familiar with the look and feel of your breasts. This can help you catch a breast problem while it is still small and can be treated. You should do breast self-exams even if you have breast implants. What you need: A mirror. A well-lit room. A pillow or other  soft object. How to do a breast self-exam Follow these steps to do a breast self-exam: Look for changes  Take off all the clothes above your waist. Stand in front of a mirror in a room with good lighting. Put your hands down at your sides. Compare your breasts in the mirror. Look for any difference between them, such as: A difference in shape. A difference in size. Wrinkles, dips, and bumps in one breast and not the other. Look at each breast for changes in the skin, such as: Redness. Scaly areas. Skin that has gotten thicker. Dimpling. Open sores (ulcers). Look for changes in your nipples, such as: Fluid coming out of a nipple. Fluid around a nipple. Bleeding. Dimpling. Redness. A nipple that looks pushed in (retracted), or that has changed position. Feel for changes Lie on your back. Feel each breast. To do this: Pick a breast to feel. Place a pillow under the shoulder closest to that breast. Put the arm closest to that breast behind your head. Feel the nipple area of that breast using the hand of your other arm. Feel the area with the pads of your three middle fingers by making small circles with your fingers. Use light, medium, and firm pressure. Continue the overlapping circles, moving downward over the breast. Keep making circles with your fingers. Stop when you feel your ribs. Start making circles with your fingers again, this time going  upward until you reach your collarbone. Then, make circles outward across your breast and into your armpit area. Squeeze your nipple. Check for discharge and lumps. Repeat these steps to check your other breast. Sit or stand in the tub or shower. With soapy water on your skin, feel each breast the same way you did when you were lying down. Write down what you find Writing down what you find can help you remember what to tell your doctor. Write down: What is normal for each breast. Any changes you find in each breast. These  include: The kind of changes you find. A tender or painful breast. Any lump you find. Write down its size and where it is. When you last had your monthly period (menstrual cycle). General tips If you are breastfeeding, the best time to check your breasts is after you feed your baby or after you use a breast pump. If you get monthly bleeding, the best time to check your breasts is 5-7 days after your monthly cycle ends. With time, you will become comfortable with the self-exam. You will also start to know if there are changes in your breasts. Contact a doctor if: You see a change in the shape or size of your breasts or nipples. You see a change in the skin of your breast or nipples, such as red or scaly skin. You have fluid coming from your nipples that is not normal. You find a new lump or thick area. You have breast pain. You have any concerns about your breast health. Summary Breast self-awareness includes looking for changes in your breasts and feeling for changes within your breasts. You should do breast self-awareness in front of a mirror in a well-lit room. If you get monthly periods (menstrual cycles), the best time to check your breasts is 5-7 days after your period ends. Tell your doctor about any changes you see in your breasts. Changes include changes in size, changes on the skin, painful or tender breasts, or fluid from your nipples that is not normal. This information is not intended to replace advice given to you by your health care provider. Make sure you discuss any questions you have with your health care provider. Document Revised: 01/18/2022 Document Reviewed: 06/15/2021 Elsevier Patient Education  2024 [employer].

## 2023-11-28 ENCOUNTER — Other Ambulatory Visit (HOSPITAL_COMMUNITY)
Admission: RE | Admit: 2023-11-28 | Discharge: 2023-11-28 | Disposition: A | Discharge status: Home / Self Care | Source: Ambulatory Visit | Attending: Obstetrics and Gynecology | Admitting: Obstetrics and Gynecology

## 2023-11-28 ENCOUNTER — Ambulatory Visit (INDEPENDENT_AMBULATORY_CARE_PROVIDER_SITE_OTHER): Admitting: Obstetrics and Gynecology

## 2023-11-28 VITALS — BP 117/56 | HR 66 | Resp 16 | Ht 66.0 in | Wt 176.0 lb

## 2023-11-28 DIAGNOSIS — N812 Incomplete uterovaginal prolapse: Secondary | ICD-10-CM

## 2023-11-28 DIAGNOSIS — Z124 Encounter for screening for malignant neoplasm of cervix: Secondary | ICD-10-CM

## 2023-11-28 DIAGNOSIS — Z01419 Encounter for gynecological examination (general) (routine) without abnormal findings: Secondary | ICD-10-CM | POA: Insufficient documentation

## 2023-11-28 DIAGNOSIS — Z1231 Encounter for screening mammogram for malignant neoplasm of breast: Secondary | ICD-10-CM

## 2023-11-30 ENCOUNTER — Encounter: Payer: Self-pay | Admitting: Obstetrics and Gynecology

## 2023-12-04 ENCOUNTER — Encounter: Payer: Self-pay | Admitting: Obstetrics and Gynecology

## 2023-12-04 ENCOUNTER — Ambulatory Visit
Admission: RE | Admit: 2023-12-04 | Discharge: 2023-12-04 | Disposition: A | Discharge status: Home / Self Care | Source: Ambulatory Visit | Attending: Acute Care | Admitting: Acute Care

## 2023-12-04 DIAGNOSIS — Z87891 Personal history of nicotine dependence: Secondary | ICD-10-CM | POA: Diagnosis not present

## 2023-12-04 DIAGNOSIS — Z122 Encounter for screening for malignant neoplasm of respiratory organs: Secondary | ICD-10-CM | POA: Diagnosis not present

## 2023-12-04 LAB — CYTOLOGY - PAP
Comment: NEGATIVE
Diagnosis: UNDETERMINED — AB
High risk HPV: NEGATIVE

## 2023-12-12 ENCOUNTER — Ambulatory Visit

## 2023-12-12 DIAGNOSIS — E538 Deficiency of other specified B group vitamins: Secondary | ICD-10-CM | POA: Diagnosis not present

## 2023-12-12 MED ORDER — CYANOCOBALAMIN 1000 MCG/ML IJ SOLN
1000.0000 ug | Freq: Once | INTRAMUSCULAR | Status: AC
Start: 2023-12-12 — End: 2023-12-12
  Administered 2023-12-12: 1000 ug via INTRAMUSCULAR

## 2023-12-12 NOTE — Progress Notes (Signed)
 Patient presented for B 12 injection to right deltoid, patient voiced no concerns nor showed any signs of distress during injection.

## 2023-12-26 ENCOUNTER — Telehealth: Payer: Self-pay

## 2023-12-26 NOTE — Telephone Encounter (Signed)
 Copied from CRM [id number]. Topic: Clinical - Request for Lab/Test Order >> Dec 26, 2023  3:05 PM Kita Perish H wrote: Reason for CRM: Patient looking to schedule her yearly thyroid  ultrasound with Daiv[facility] Dub, please reach out, thanks.  [facility] [phone]

## 2023-12-26 NOTE — Telephone Encounter (Signed)
 Copied from CRM [id number]. Topic: Referral - Request for Referral >> Dec 26, 2023  2:59 PM Kita Perish H wrote: Did the patient discuss referral with their provider in the last year? Yes, spoke with provider about issue (If No - schedule appointment) (If Yes - send message)  Appointment offered? No  Type of order/referral and detailed reason for visit: Dermatology, has white hard spot on eyebrow  Preference of office, provider, location: [facility] [place], [phone] Spoke with Isa Manuel  If referral order, have you been seen by this specialty before? No, referral in system was never scheduled office needs new referral (If Yes, this issue or another issue? When? Where?  Can we respond through MyChart? No

## 2023-12-26 NOTE — Telephone Encounter (Signed)
 Placed referral form on provider desk for review and signature

## 2023-12-27 ENCOUNTER — Other Ambulatory Visit: Payer: Self-pay | Admitting: Family

## 2023-12-27 DIAGNOSIS — E041 Nontoxic single thyroid nodule: Secondary | ICD-10-CM

## 2023-12-27 DIAGNOSIS — E890 Postprocedural hypothyroidism: Secondary | ICD-10-CM

## 2023-12-27 NOTE — Telephone Encounter (Signed)
 Placed on Megan Harper's desk, please complete referral and fax to [facility] dermatology.    I have not evaluated new skin lesion.  advise appointment with me as I am concerned for a new lesion especially in the sun exposed area.  I want ensure that lesion does not appear cancerous.    Meanwhile we will work on referral as well.

## 2023-12-27 NOTE — Telephone Encounter (Signed)
 Pt has been scheduled for in office visit for new skin lesion 01/01/24

## 2023-12-27 NOTE — Telephone Encounter (Signed)
 Pt has been notified that US  thyroid  has been ordered and to give us  a call if she has not heard back from them within a week. Pt verbalized understanding

## 2024-01-01 ENCOUNTER — Encounter: Payer: Self-pay | Admitting: Family

## 2024-01-01 ENCOUNTER — Ambulatory Visit (INDEPENDENT_AMBULATORY_CARE_PROVIDER_SITE_OTHER)

## 2024-01-01 ENCOUNTER — Ambulatory Visit: Admitting: Family

## 2024-01-01 VITALS — BP 128/68 | HR 65 | Temp 98.2°F | Ht 66.5 in | Wt 173.0 lb

## 2024-01-01 DIAGNOSIS — M25511 Pain in right shoulder: Secondary | ICD-10-CM | POA: Insufficient documentation

## 2024-01-01 DIAGNOSIS — M19011 Primary osteoarthritis, right shoulder: Secondary | ICD-10-CM | POA: Diagnosis not present

## 2024-01-01 DIAGNOSIS — L989 Disorder of the skin and subcutaneous tissue, unspecified: Secondary | ICD-10-CM | POA: Diagnosis not present

## 2024-01-01 DIAGNOSIS — M47812 Spondylosis without myelopathy or radiculopathy, cervical region: Secondary | ICD-10-CM | POA: Diagnosis not present

## 2024-01-01 MED ORDER — GABAPENTIN 100 MG PO CAPS
100.0000 mg | ORAL_CAPSULE | Freq: Every day | ORAL | 3 refills | Status: DC
Start: 2024-01-01 — End: 2024-01-30

## 2024-01-01 NOTE — Progress Notes (Signed)
 Assessment & Plan:  Acute pain of right shoulder Assessment & Plan: Presentation consistent with impingement syndrome.  Pending cervical and shoulder x-ray.  Start gabapentin 100 mg nightly.  Start physical therapy.  Orders: -     DG Shoulder Right; Future -     DG Cervical Spine Complete; Future -     Gabapentin; Take 1 capsule (100 mg total) by mouth at bedtime.  Dispense: 30 capsule; Refill: 3 -     Ambulatory referral to Physical Therapy  Skin lesion Assessment & Plan: Tender, itchy skin lesion right brow concerning for squamous cell carcinoma versus AK. Referral dermatology to be seen promptly.      Return precautions given.   Risks, benefits, and alternatives of the medications and treatment plan prescribed today were discussed, and patient expressed understanding.   Education regarding symptom management and diagnosis given to patient on AVS either electronically or printed.  Return in about 6 weeks (around 02/12/2024).  [name], FNP  Subjective:    Patient ID: Megan Harper, female    DOB: Feb 25, 1961, 63 y.o.   MRN: [id number]  CC: Megan Harper is a 63 y.o. female who presents today for an acute visit.    HPI: Complains right lateral shoulder pain for 2 months, worsening She will feel a deep ache, and painful to sleep on right side She will a 'catch' when raised arm Denies numbness, joint swelling She is right handed;she notices when she was weed eating last week, arm pain worsening  She has taken mobic  for this.   Right lesion in her right eyebrow x 1 year She trimmed the lesion and it has grown back Tender , itching;  not bleeding  No h/o skin cancer  Prior right hand lesion has healed  Following with oncology for MGUS. History of COPD Allergies: Ceclor [cefaclor], Influenza vaccines, and Pantoprazole  Current Outpatient Medications on File Prior to Visit  Medication Sig Dispense Refill   albuterol  (VENTOLIN  HFA) 108 (90 Base) MCG/ACT  inhaler Inhale 2 puffs into the lungs every 6 (six) hours as needed for wheezing or shortness of breath. 18 g 3   fluticasone  (FLONASE ) 50 MCG/ACT nasal spray Place 2 sprays into both nostrils daily. 16 g 6   loratadine  (CLARITIN ) 10 MG tablet Take 1 tablet (10 mg total) by mouth daily. 90 tablet 3   meloxicam  (MOBIC ) 7.5 MG tablet Take 1 tablet (7.5 mg total) by mouth daily as needed for pain. 30 tablet 1   mometasone  (ELOCON ) 0.1 % cream Use pea-sized amount in bilateral ear for itching as needed 15 g 1   omeprazole  (PRILOSEC) 20 MG capsule TAKE 1 CAPSULE BY MOUTH DAILY AS NEEDED FOR EPIGASTRIC BURNING 90 capsule 1   Oxyquinoline-Sod Lauryl Sulf (TRIMO-SAN) 0.025-0.01 % GEL Place 1 Applicatorful vaginally once a week. 113.4 g 3   No current facility-administered medications on file prior to visit.    Review of Systems  Constitutional:  Negative for chills and fever.  Respiratory:  Negative for cough.   Cardiovascular:  Negative for chest pain and palpitations.  Gastrointestinal:  Negative for nausea and vomiting.  Musculoskeletal:  Positive for arthralgias.  Skin:  Negative for wound.      Objective:    BP 128/68   Pulse 65   Temp 98.2 F (36.8 C) (Oral)   Ht 5' 6.5" (1.689 m)   Wt 173 lb (78.5 kg)   SpO2 97%   BMI 27.50 kg/m   BP Readings from Last 3  Encounters:  01/01/24 128/68  11/28/23 (!) 117/56  11/25/23 122/65   Wt Readings from Last 3 Encounters:  01/01/24 173 lb (78.5 kg)  12/04/23 172 lb (78 kg)  11/28/23 176 lb (79.8 kg)    Physical Exam Vitals reviewed.  Constitutional:      Appearance: She is well-developed.  HENT:     Head:      Comments: Right pedunculated slightly tender lesion in eyebrow. Eyes:     Conjunctiva/sclera: Conjunctivae normal.  Cardiovascular:     Rate and Rhythm: Normal rate and regular rhythm.     Pulses: Normal pulses.     Heart sounds: Normal heart sounds.  Pulmonary:     Effort: Pulmonary effort is normal.     Breath  sounds: Normal breath sounds. No wheezing, rhonchi or rales.  Musculoskeletal:     Right shoulder: Tenderness present. No swelling, deformity or bony tenderness. Decreased range of motion.     Left shoulder: No swelling, deformity, tenderness or bony tenderness. Normal range of motion.       Arms:     Cervical back: No swelling, torticollis, tenderness or bony tenderness. No pain with movement. Normal range of motion.     Comments: Right Shoulder:   No asymmetry of shoulders when comparing right and left. Pain with palpation over glenohumeral joint lines, AC joint.Pain with internal and external rotation. Pain with resisted lateral extension and reduced ROM.  Negative active painful arc sign. Negative passive arc ( Neer's). Negative drop arm.    Strength and sensation normal BUE's.   Skin:    General: Skin is warm and dry.  Neurological:     Mental Status: She is alert.  Psychiatric:        Speech: Speech normal.        Behavior: Behavior normal.        Thought Content: Thought content normal.

## 2024-01-01 NOTE — Assessment & Plan Note (Addendum)
 Tender, itchy skin lesion right brow concerning for squamous cell carcinoma versus AK. Referral dermatology to be seen promptly.

## 2024-01-01 NOTE — Patient Instructions (Addendum)
 As discussed, please call  [facility] ( [phone])  to schedule appointment ; we completed paperwork last week  Let me know if any issues in getting scheduled promptly.   Start gabapentin at bedtime we will titrate.  I have also placed referral to physical therapy.  May take meloxicam  you may take meloxicam  which is a potent anti-inflammatory with food for a week or two as needed.  This medic this medication is not intended for daily use or prolonged use.   Let us  know if you dont hear back within a week in regards to an appointment being scheduled.   So that you are aware, if you are [facility] MyChart user , please pay attention to your MyChart messages as you may receive a MyChart message with a phone number to call and schedule this test/appointment own your own from our referral coordinator. This is a new process so I do not want you to miss this message.  If you are not a MyChart user, you will receive a phone call.

## 2024-01-01 NOTE — Assessment & Plan Note (Signed)
 Presentation consistent with impingement syndrome.  Pending cervical and shoulder x-ray.  Start gabapentin 100 mg nightly.  Start physical therapy.

## 2024-01-02 ENCOUNTER — Encounter: Payer: Self-pay | Admitting: Family

## 2024-01-06 ENCOUNTER — Other Ambulatory Visit: Payer: Self-pay | Admitting: Acute Care

## 2024-01-06 DIAGNOSIS — L821 Other seborrheic keratosis: Secondary | ICD-10-CM | POA: Diagnosis not present

## 2024-01-06 DIAGNOSIS — D2262 Melanocytic nevi of left upper limb, including shoulder: Secondary | ICD-10-CM | POA: Diagnosis not present

## 2024-01-06 DIAGNOSIS — D2272 Melanocytic nevi of left lower limb, including hip: Secondary | ICD-10-CM | POA: Diagnosis not present

## 2024-01-06 DIAGNOSIS — Z122 Encounter for screening for malignant neoplasm of respiratory organs: Secondary | ICD-10-CM

## 2024-01-06 DIAGNOSIS — Z87891 Personal history of nicotine dependence: Secondary | ICD-10-CM

## 2024-01-06 DIAGNOSIS — D225 Melanocytic nevi of trunk: Secondary | ICD-10-CM | POA: Diagnosis not present

## 2024-01-06 DIAGNOSIS — D485 Neoplasm of uncertain behavior of skin: Secondary | ICD-10-CM | POA: Diagnosis not present

## 2024-01-06 DIAGNOSIS — D2261 Melanocytic nevi of right upper limb, including shoulder: Secondary | ICD-10-CM | POA: Diagnosis not present

## 2024-01-06 DIAGNOSIS — D2271 Melanocytic nevi of right lower limb, including hip: Secondary | ICD-10-CM | POA: Diagnosis not present

## 2024-01-09 ENCOUNTER — Ambulatory Visit: Payer: Self-pay | Admitting: Family

## 2024-01-13 ENCOUNTER — Ambulatory Visit

## 2024-01-13 DIAGNOSIS — E538 Deficiency of other specified B group vitamins: Secondary | ICD-10-CM

## 2024-01-13 MED ORDER — CYANOCOBALAMIN 1000 MCG/ML IJ SOLN
1000.0000 ug | Freq: Once | INTRAMUSCULAR | Status: AC
  Administered 2024-01-13: 1000 ug via INTRAMUSCULAR

## 2024-01-13 NOTE — Progress Notes (Signed)
 Pt presented for their vitamin B12 injection. Pt was identified through two identifiers. Pt tolerated shot well in their left deltoid.

## 2024-01-14 ENCOUNTER — Ambulatory Visit
Admission: RE | Admit: 2024-01-14 | Discharge: 2024-01-14 | Disposition: A | Discharge status: Home / Self Care | Source: Ambulatory Visit | Attending: Family | Admitting: Family

## 2024-01-14 ENCOUNTER — Encounter (INDEPENDENT_AMBULATORY_CARE_PROVIDER_SITE_OTHER): Payer: Self-pay

## 2024-01-14 DIAGNOSIS — E041 Nontoxic single thyroid nodule: Secondary | ICD-10-CM | POA: Diagnosis not present

## 2024-01-14 DIAGNOSIS — E890 Postprocedural hypothyroidism: Secondary | ICD-10-CM | POA: Diagnosis not present

## 2024-01-15 ENCOUNTER — Other Ambulatory Visit: Payer: Self-pay | Admitting: Family

## 2024-01-15 DIAGNOSIS — Z1231 Encounter for screening mammogram for malignant neoplasm of breast: Secondary | ICD-10-CM

## 2024-01-17 ENCOUNTER — Ambulatory Visit: Payer: Self-pay | Admitting: Family

## 2024-01-17 DIAGNOSIS — E041 Nontoxic single thyroid nodule: Secondary | ICD-10-CM

## 2024-01-17 DIAGNOSIS — E890 Postprocedural hypothyroidism: Secondary | ICD-10-CM

## 2024-01-22 ENCOUNTER — Other Ambulatory Visit

## 2024-01-30 ENCOUNTER — Encounter: Payer: Self-pay | Admitting: Family

## 2024-01-30 ENCOUNTER — Ambulatory Visit: Payer: Medicaid Other | Admitting: Family

## 2024-01-30 ENCOUNTER — Other Ambulatory Visit: Payer: Self-pay | Admitting: Family

## 2024-01-30 ENCOUNTER — Ambulatory Visit: Payer: Self-pay | Admitting: Family

## 2024-01-30 VITALS — BP 130/70 | HR 76 | Temp 97.8°F | Ht 66.5 in | Wt 174.6 lb

## 2024-01-30 DIAGNOSIS — I700 Atherosclerosis of aorta: Secondary | ICD-10-CM | POA: Diagnosis not present

## 2024-01-30 DIAGNOSIS — M25511 Pain in right shoulder: Secondary | ICD-10-CM | POA: Diagnosis not present

## 2024-01-30 DIAGNOSIS — E559 Vitamin D deficiency, unspecified: Secondary | ICD-10-CM | POA: Diagnosis not present

## 2024-01-30 DIAGNOSIS — E538 Deficiency of other specified B group vitamins: Secondary | ICD-10-CM

## 2024-01-30 DIAGNOSIS — Z1231 Encounter for screening mammogram for malignant neoplasm of breast: Secondary | ICD-10-CM

## 2024-01-30 LAB — B12 AND FOLATE PANEL
Folate: 7 ng/mL (ref >5.9)
Vitamin B-12: 487 pg/mL (ref 211–911)

## 2024-01-30 LAB — VITAMIN D 25 HYDROXY (VIT D DEFICIENCY, FRACTURES): VITD: 33.01 ng/mL (ref 30.00–100.00)

## 2024-01-30 NOTE — Progress Notes (Addendum)
 Assessment & Plan:   B12 deficiency Assessment & Plan: Previously responded to B12 injections monthly.  Pending B12 lab.   Orders: -     B12 and Folate Panel  Encounter for screening mammogram for malignant neoplasm of breast -     3D Screening Mammogram, Left and Right; Future  Vitamin D  deficiency -     VITAMIN D  25 Hydroxy (Vit-D Deficiency, Fractures)  Acute pain of right shoulder Assessment & Plan: Improved from prior. She is taking prn meloxicam  7.5mg  prn.  Discussed physical therapy.  She politely declines at this time.  Will follow.    Atherosclerosis of aorta (HCC) Assessment & Plan: Reviewed CT lung cancer screen,Lrads 1, negative, atherosclerosis.  Overdue for lipid panel. Ordered.       Return precautions given.   Risks, benefits, and alternatives of the medications and treatment plan prescribed today were discussed, and patient expressed understanding.   Education regarding symptom management and diagnosis given to patient on AVS either electronically or printed.  Return in about 6 months (around 07/31/2024).  [name], FNP  Subjective:    Patient ID: Megan Harper, female    DOB: [birth year], 62 y.o.   MRN: [id number]  CC: Megan Harper is a 63 y.o. female who presents today for follow up.   HPI: Right shoulder pain is improved She didn't start gabapentin . She takes mobic  prn with relief.   She was seen dermatology 01/06/24 right eyelid lesion, dx with 'precancerous.' She is using topical fluorouracil   She is compliant vitamin D  1000 units daily.  She also continues monthly b12 IM.  She would like her levels checked today   Started Gabapentin  100 mg nightly. Referral to physical therapy right shoulder pain 5 weeks ago.  Pending referral to dermatology, endocrinology.  CT lung cancer screen with atherosclerotic not aneurysm thoracic aorta.  Stable 1.8 posterior left thyroid  nodule.  Emphysema.  No significant pulmonary nodules.  Moderate  thoracic spondylosis  Cardiology consult 02/12/2023 Dr Albert Huff regarding atherosclerosis.  Advised Aspirin  81 mg daily  Allergies: Ceclor [cefaclor], Influenza vaccines, and Pantoprazole  Current Outpatient Medications on File Prior to Visit  Medication Sig Dispense Refill   cholecalciferol (VITAMIN D3) 25 MCG (1000 UNIT) tablet Take 1,000 Units by mouth daily.     albuterol  (VENTOLIN  HFA) 108 (90 Base) MCG/ACT inhaler Inhale 2 puffs into the lungs every 6 (six) hours as needed for wheezing or shortness of breath. 18 g 3   fluticasone  (FLONASE ) 50 MCG/ACT nasal spray Place 2 sprays into both nostrils daily. 16 g 6   loratadine  (CLARITIN ) 10 MG tablet Take 1 tablet (10 mg total) by mouth daily. 90 tablet 3   meloxicam  (MOBIC ) 7.5 MG tablet Take 1 tablet (7.5 mg total) by mouth daily as needed for pain. 30 tablet 1   mometasone  (ELOCON ) 0.1 % cream Use pea-sized amount in bilateral ear for itching as needed (Patient not taking: Reported on 01/30/2024) 15 g 1   omeprazole  (PRILOSEC) 20 MG capsule TAKE 1 CAPSULE BY MOUTH DAILY AS NEEDED FOR EPIGASTRIC BURNING 90 capsule 1   Oxyquinoline-Sod Lauryl Sulf (TRIMO-SAN) 0.025-0.01 % GEL Place 1 Applicatorful vaginally once a week. 113.4 g 3   No current facility-administered medications on file prior to visit.    Review of Systems  Constitutional:  Negative for chills and fever.  Respiratory:  Negative for cough.   Cardiovascular:  Negative for chest pain and palpitations.  Gastrointestinal:  Negative for nausea and vomiting.  Musculoskeletal:  Positive for arthralgias (improved).      Objective:    BP 130/70   Pulse 76   Temp 97.8 F (36.6 C) (Oral)   Ht 5' 6.5" (1.689 m)   Wt 174 lb 9.6 oz (79.2 kg)   SpO2 97%   BMI 27.76 kg/m  BP Readings from Last 3 Encounters:  01/30/24 130/70  01/01/24 128/68  11/28/23 (!) 117/56   Wt Readings from Last 3 Encounters:  01/30/24 174 lb 9.6 oz (79.2 kg)  01/01/24 173 lb (78.5 kg)  12/04/23 172 lb (78  kg)    Physical Exam Vitals reviewed.  Constitutional:      Appearance: She is well-developed.  Eyes:     Conjunctiva/sclera: Conjunctivae normal.  Cardiovascular:     Rate and Rhythm: Normal rate and regular rhythm.     Pulses: Normal pulses.     Heart sounds: Normal heart sounds.  Pulmonary:     Effort: Pulmonary effort is normal.     Breath sounds: Normal breath sounds. No wheezing, rhonchi or rales.  Skin:    General: Skin is warm and dry.  Neurological:     Mental Status: She is alert.  Psychiatric:        Speech: Speech normal.        Behavior: Behavior normal.        Thought Content: Thought content normal.

## 2024-01-30 NOTE — Assessment & Plan Note (Signed)
 Previously responded to B12 injections monthly.  Pending B12 lab.

## 2024-01-30 NOTE — Assessment & Plan Note (Signed)
 Improved from prior. She is taking prn meloxicam  7.5mg  prn.  Discussed physical therapy.  She politely declines at this time.  Will follow.

## 2024-01-30 NOTE — Assessment & Plan Note (Addendum)
 Reviewed CT lung cancer screen,Lrads 1, negative, atherosclerosis.  Overdue for lipid panel. Ordered.

## 2024-02-13 ENCOUNTER — Ambulatory Visit

## 2024-02-13 ENCOUNTER — Ambulatory Visit: Payer: Self-pay | Admitting: Cardiology

## 2024-02-13 DIAGNOSIS — E538 Deficiency of other specified B group vitamins: Secondary | ICD-10-CM | POA: Diagnosis not present

## 2024-02-13 MED ORDER — CYANOCOBALAMIN 1000 MCG/ML IJ SOLN
1000.0000 ug | Freq: Once | INTRAMUSCULAR | Status: AC
  Administered 2024-02-13: 1000 ug via INTRAMUSCULAR

## 2024-02-13 NOTE — Progress Notes (Cosign Needed)
 Patient presented for B 12 injection to left deltoid per patient request. Patient voiced no concerns nor showed any signs of distress during injection.

## 2024-02-19 DIAGNOSIS — E041 Nontoxic single thyroid nodule: Secondary | ICD-10-CM | POA: Diagnosis not present

## 2024-02-19 DIAGNOSIS — Z1331 Encounter for screening for depression: Secondary | ICD-10-CM | POA: Diagnosis not present

## 2024-02-26 IMAGING — CT CT CHEST LUNG CANCER SCREENING LOW DOSE W/O CM
2 of 5 series · 15 of 40 positions shown, 18 images · non-contrast
Comparison: 10/18/2020

CLINICAL DATA: Lung cancer screening. 64 pack-year history of
smoking.



[Series 3: lung 1.00 · axial · 0.57mm/px · z∈[-1212,-912]mm · 12 of 330 slices shown, 15 images]
[im 15/330  mediastinal]
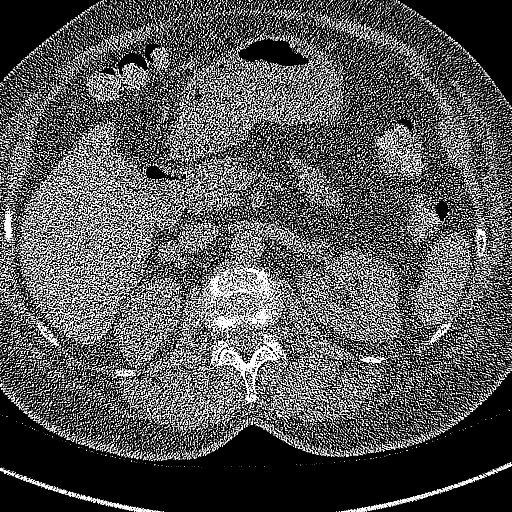
[im 15/330  lung]
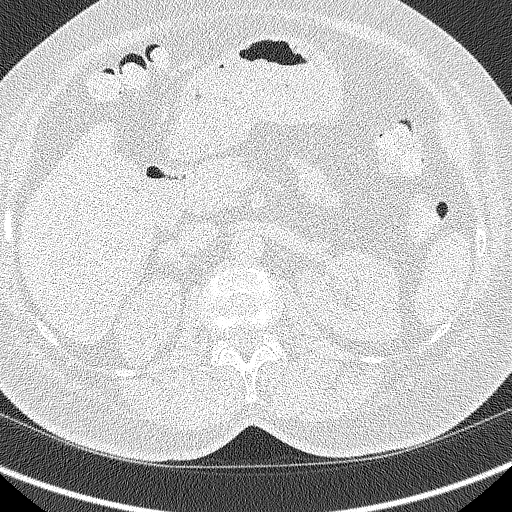
[im 60/330  lung]
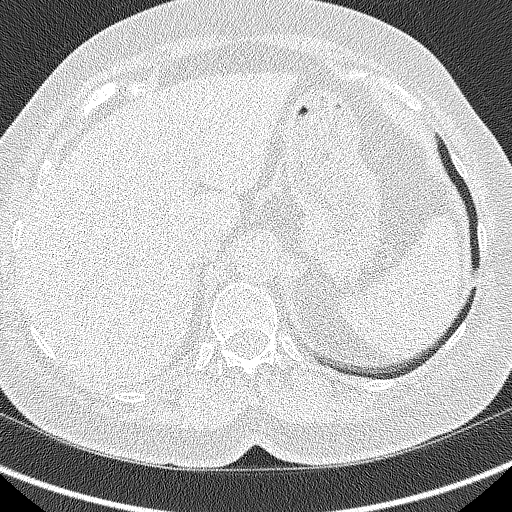
[im 90/330  lung]
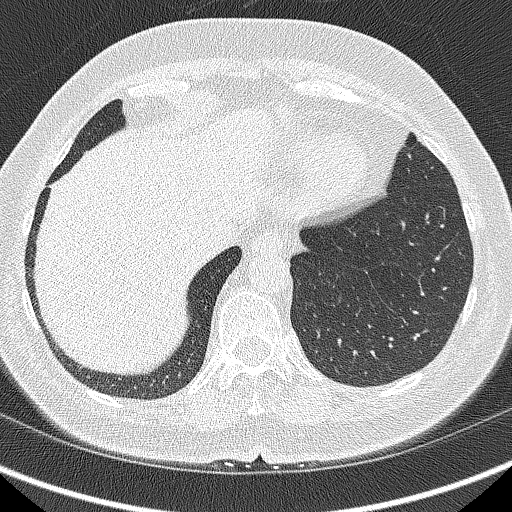
[im 105/330  lung]
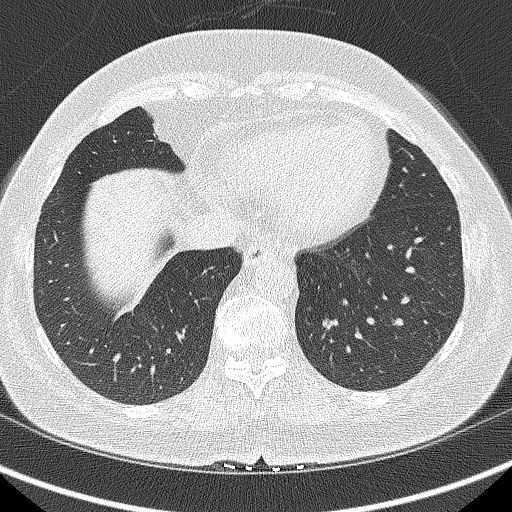
[im 135/330  mediastinal]
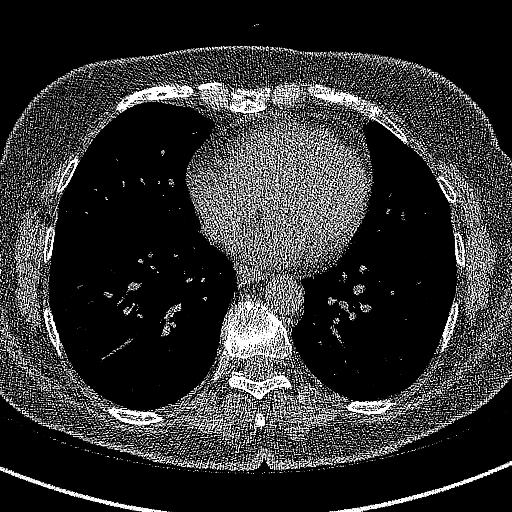
[im 135/330  lung]
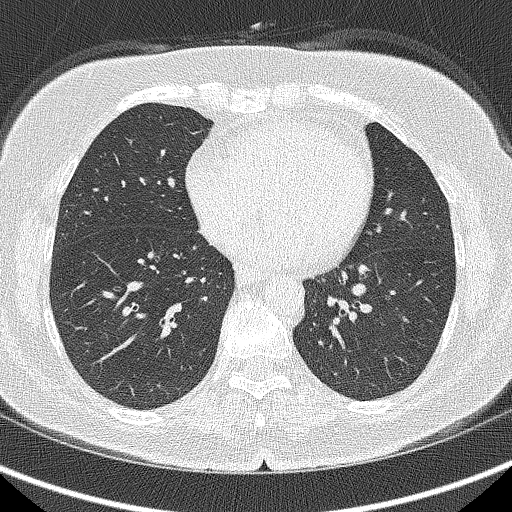
[im 165/330  lung]
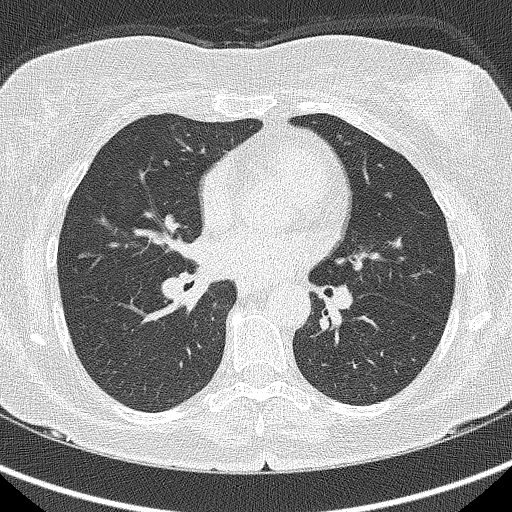
[im 180/330  lung]
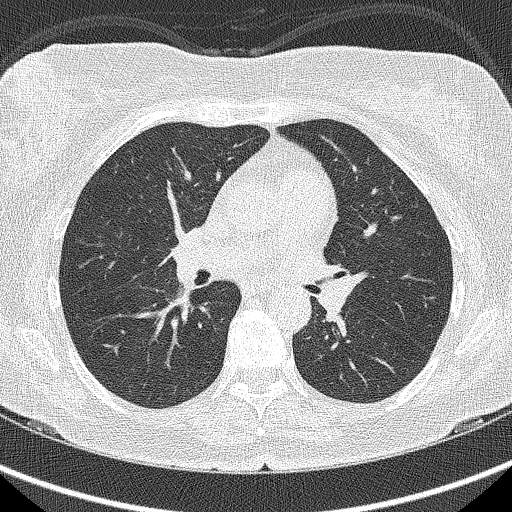
[im 210/330  lung]
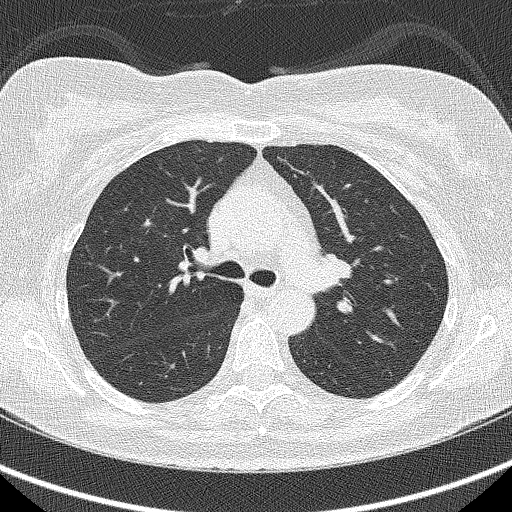
[im 240/330  mediastinal]
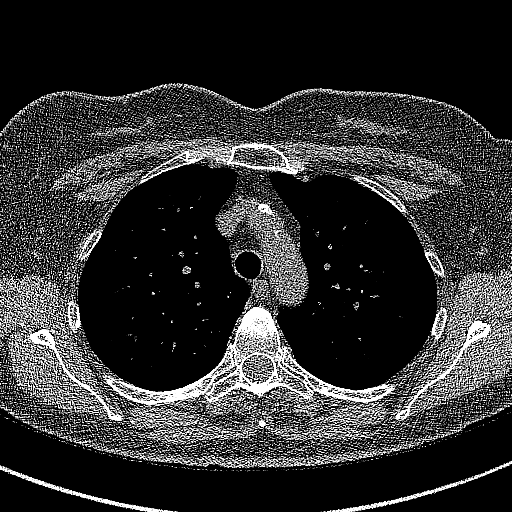
[im 240/330  lung]
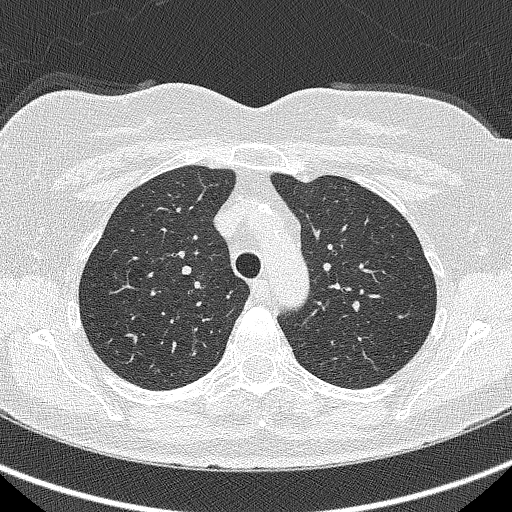
[im 255/330  lung]
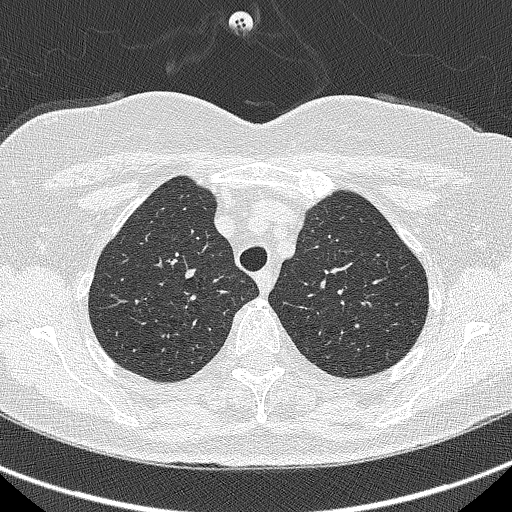
[im 285/330  lung]
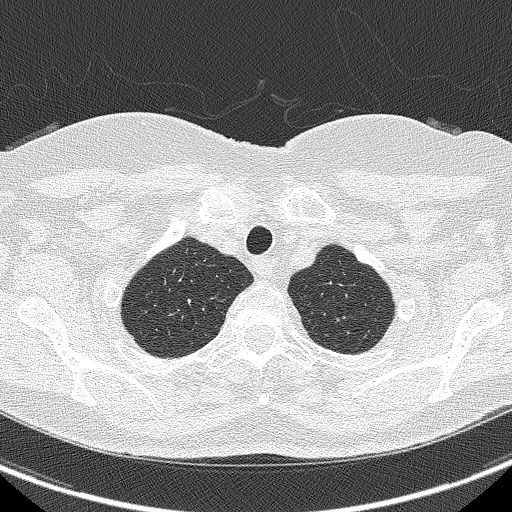
[im 315/330  lung]
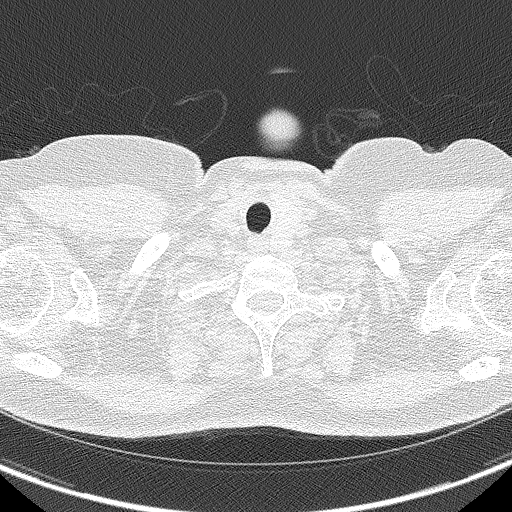

[Series 5: coronals lung 1.00 cor · coronal · 0.57mm/px · 3 of 272 slices shown]
[im 55/272  lung]
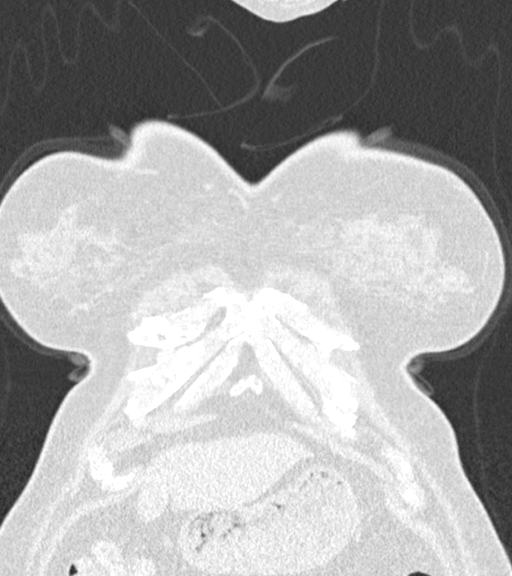
[im 109/272  lung]
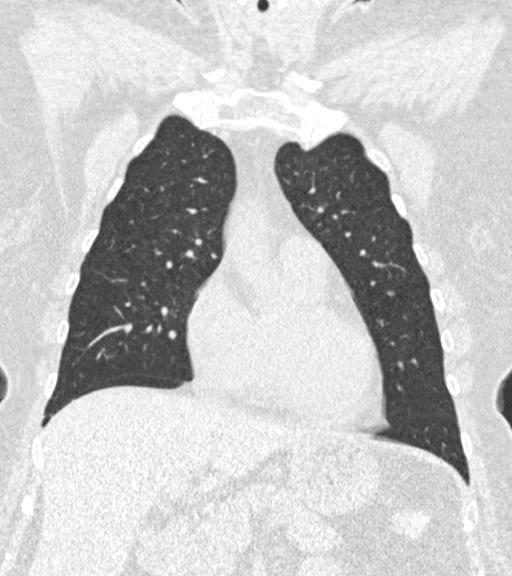
[im 163/272  lung]
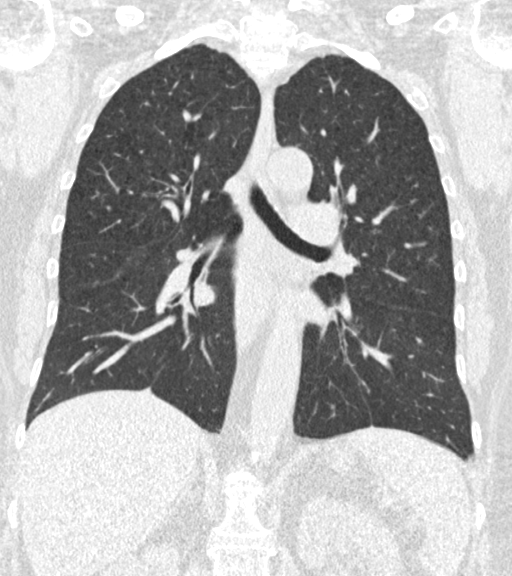

[15 of 40 positions shown; findings below may reference images not displayed]

FINDINGS: Cardiovascular: The heart size is normal. No substantial pericardial
effusion. No thoracic aortic aneurysm.

Mediastinum/Nodes: No mediastinal lymphadenopathy. Exophytic 2.2 cm
left thyroid nodule posteriorly. No evidence for gross hilar
lymphadenopathy although assessment is limited by the lack of
intravenous contrast on the current study. The esophagus has normal
imaging features. There is no axillary lymphadenopathy.

Lungs/Pleura: Centrilobular emphsyema noted. Scattered tiny
calcified granulomas identified previously are stable. There is no
new suspicious pulmonary nodule or mass. No focal airspace
consolidation. No pleural effusion.

Upper Abdomen: Unremarkable.

Musculoskeletal: No worrisome lytic or sclerotic osseous
abnormality.
IMPRESSION: 1. Lung-RADS 1, negative. Continue annual screening with low-dose
chest CT without contrast in 12 months.
2. 2.2 cm exophytic left thyroid nodule. Recommend thyroid US (ref:
[HOSPITAL]. [DATE]): 143-50).
3.  Emphysema. ([id number])

## 2024-02-27 ENCOUNTER — Ambulatory Visit
Admission: RE | Admit: 2024-02-27 | Discharge: 2024-02-27 | Disposition: A | Discharge status: Home / Self Care | Source: Ambulatory Visit | Attending: Family | Admitting: Family

## 2024-02-27 DIAGNOSIS — Z1231 Encounter for screening mammogram for malignant neoplasm of breast: Secondary | ICD-10-CM | POA: Insufficient documentation

## 2024-03-02 ENCOUNTER — Other Ambulatory Visit: Payer: Self-pay | Admitting: Family

## 2024-03-02 DIAGNOSIS — K219 Gastro-esophageal reflux disease without esophagitis: Secondary | ICD-10-CM

## 2024-03-16 ENCOUNTER — Ambulatory Visit (INDEPENDENT_AMBULATORY_CARE_PROVIDER_SITE_OTHER)

## 2024-03-16 DIAGNOSIS — E538 Deficiency of other specified B group vitamins: Secondary | ICD-10-CM

## 2024-03-16 MED ORDER — CYANOCOBALAMIN 1000 MCG/ML IJ SOLN
1000.0000 ug | Freq: Once | INTRAMUSCULAR | Status: AC
  Administered 2024-03-16: 1000 ug via INTRAMUSCULAR

## 2024-03-16 NOTE — Progress Notes (Signed)
 Pt presented for their vitamin B12 injection. Pt was identified through two identifiers. Pt tolerated shot well in their left deltoid.

## 2024-03-26 IMAGING — US US THYROID
2 series · 13 of 25 positions shown · non-contrast
Comparison: 11/21/2009

CLINICAL DATA: Remote right thyroidectomy

EXAM:
THYROID ULTRASOUND
TECHNIQUE: Ultrasound examination of the thyroid gland and adjacent soft
tissues was performed.

[Series 1: us thyroid · 12 of 61 slices shown (1 of 2)]
[im 1/61]
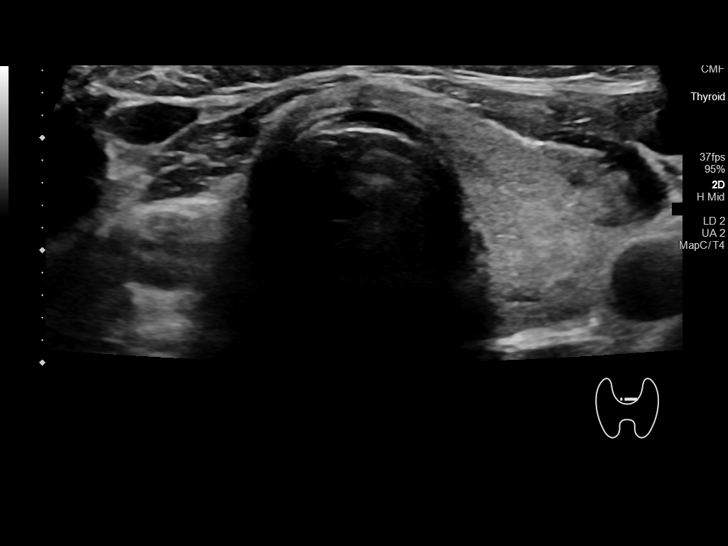
[im 6/61]
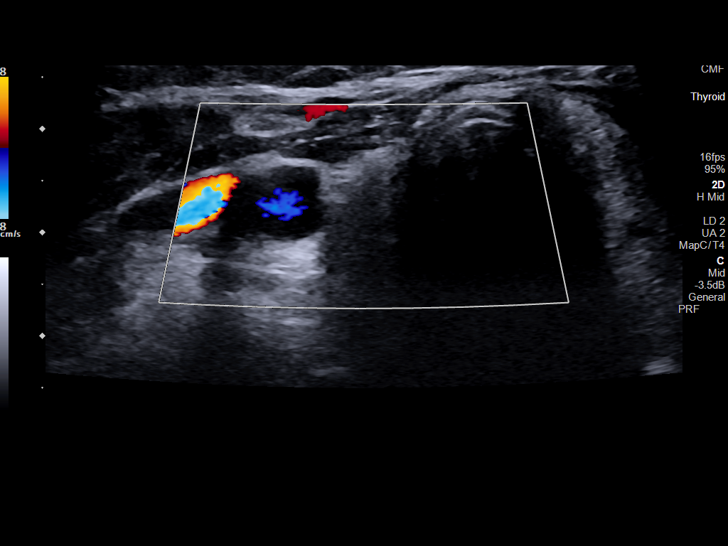
[im 11/61]
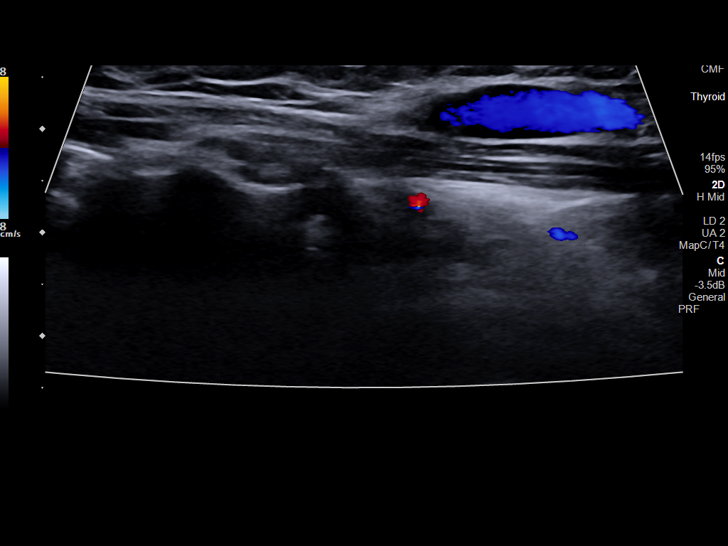
[im 17/61]
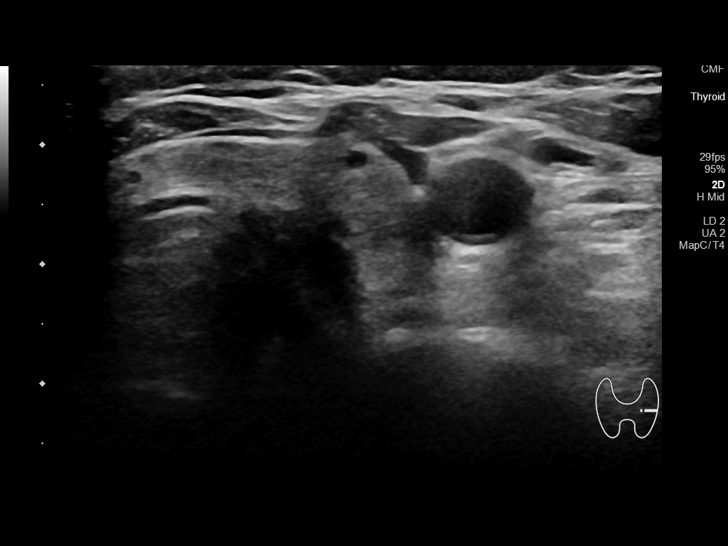
[im 22/61]
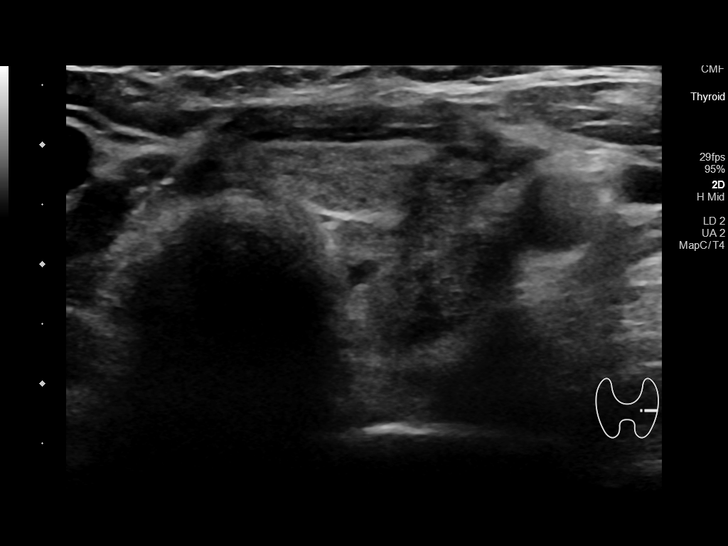
[im 28/61]
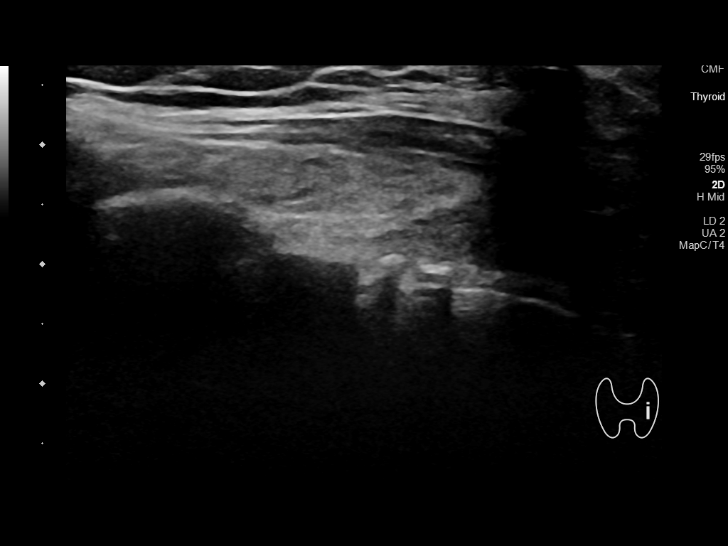
[im 33/61]
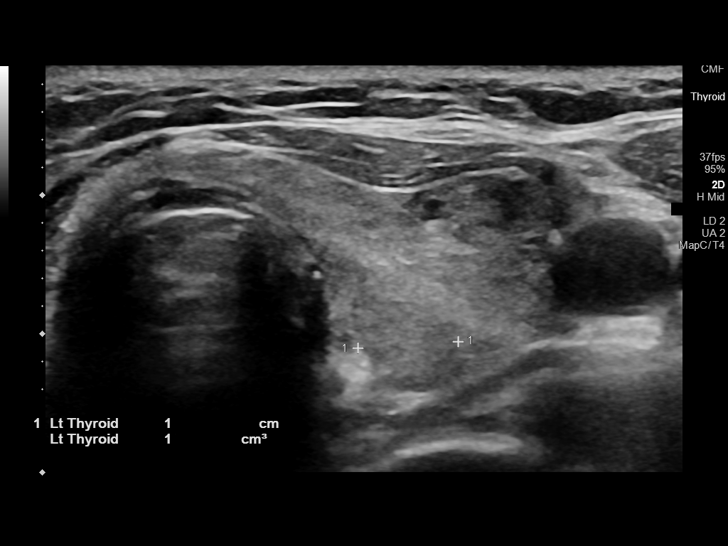
[im 39/61]
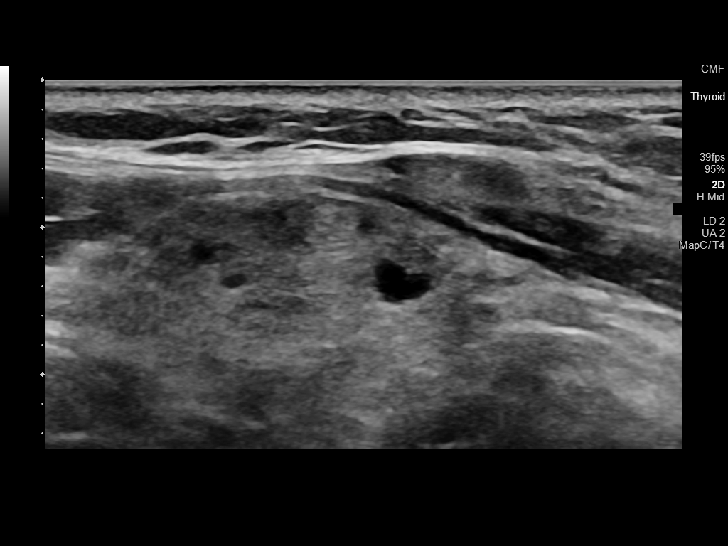
[im 44/61]
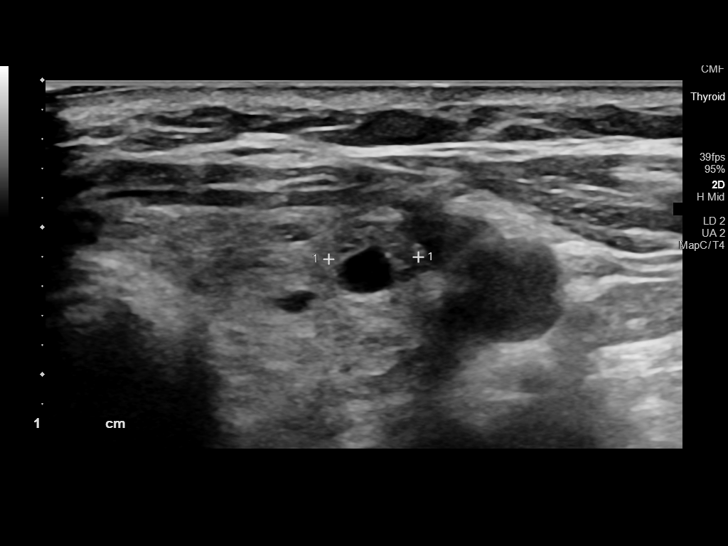
[im 50/61]
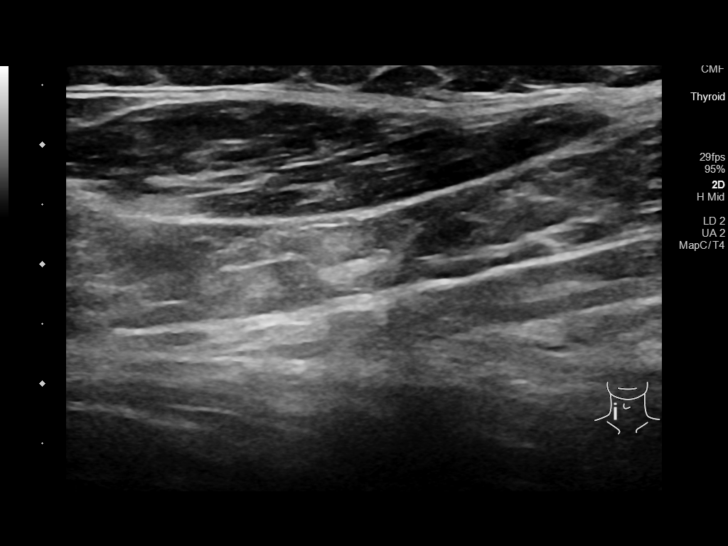
[im 55/61]
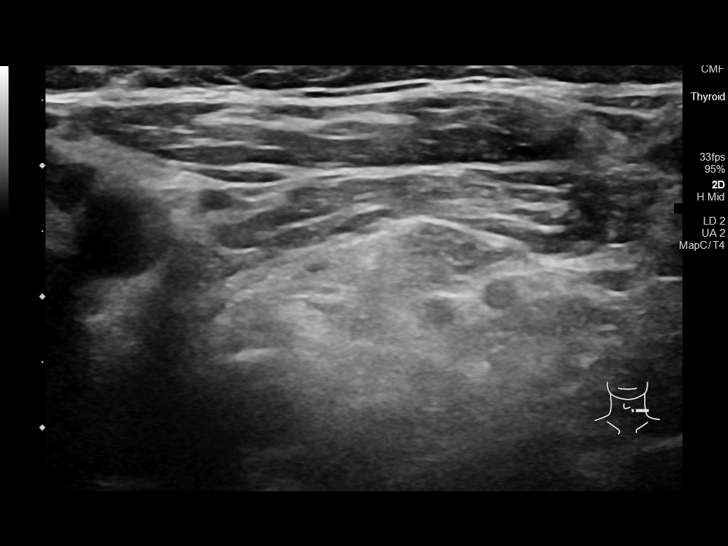
[im 61/61]
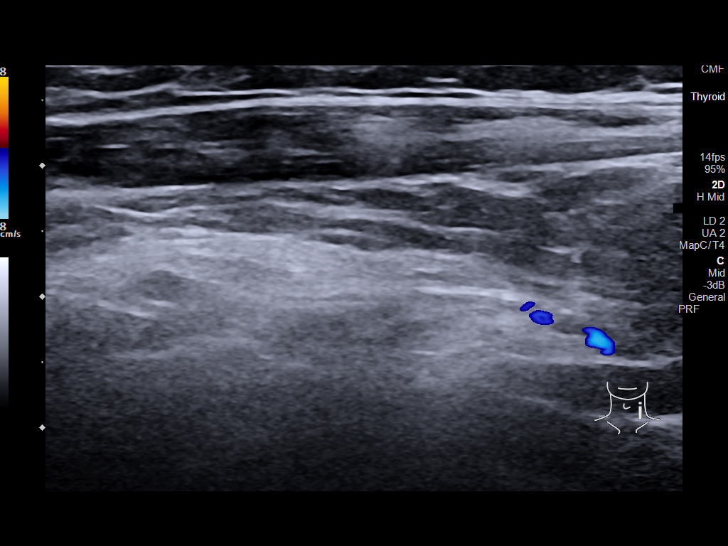

[Series 1001: us thyroid · 1 of 5 slices shown (2 of 2)]
[im 5/5]
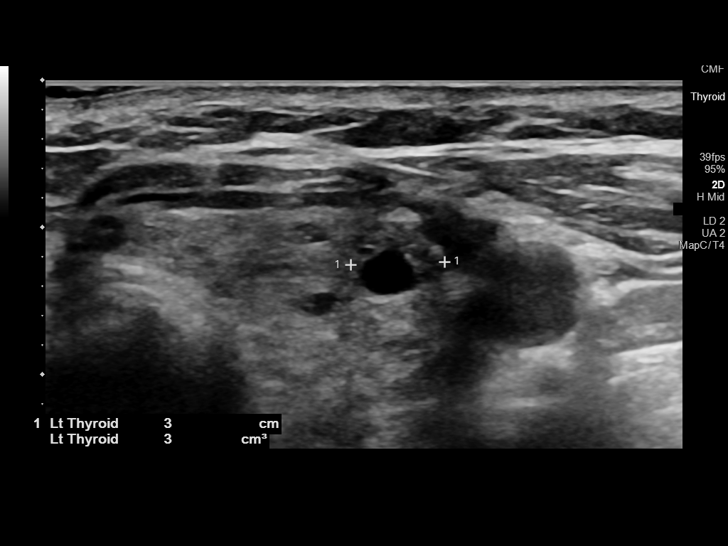

[13 of 25 positions shown; findings below may reference images not displayed]

FINDINGS: Parenchymal Echotexture: Moderately heterogenous

Isthmus: 3 mm

Right lobe: Surgically absent

Left lobe: 3.9 x 1.8 x 2.0 cm

_________________________________________________________

Estimated total number of nodules >/= 1 cm: 1

Number of spongiform nodules >/=  2 cm not described below (TR1): 0

Number of mixed cystic and solid nodules >/= 1.5 cm not described
below (TR2): 0

_________________________________________________________

Nodule # 2:

Location: Left; Mid

Maximum size: 1.3 cm; Other 2 dimensions: 0.4 x 1.0 cm

Composition: solid/almost completely solid (2)

Echogenicity: hypoechoic (2)

Shape: not taller-than-wide (0)

Margins: ill-defined (0)

Echogenic foci: none (0)

ACR TI-RADS total points: 4.

ACR TI-RADS risk category: TR4 (4-6 points).

ACR TI-RADS recommendations:

*Given size (>/= 1 - 1.4 cm) and appearance, a follow-up ultrasound
in 1 year should be considered based on TI-RADS criteria.

_________________________________________________________

There are additional subcentimeter left thyroid hypoechoic and mixed
cystic/solid nodules noted, all measuring 8 mm or less in size.
These would not meet criteria for any biopsy or follow-up. Dominant
nodule detailed above.

Remote right thyroidectomy. No hypervascularity. No regional
adenopathy.
IMPRESSION: 1.3 cm left mid thyroid TR 4 nodule meets criteria follow-up in 1
year.

Remote right thyroidectomy.

Additional left subcentimeter nodules noted.

The above is in keeping with the ACR TI-RADS recommendations - [HOSPITAL] 0465;[DATE].

## 2024-04-01 DIAGNOSIS — D0439 Carcinoma in situ of skin of other parts of face: Secondary | ICD-10-CM | POA: Diagnosis not present

## 2024-04-16 ENCOUNTER — Ambulatory Visit

## 2024-04-16 DIAGNOSIS — E538 Deficiency of other specified B group vitamins: Secondary | ICD-10-CM

## 2024-04-16 MED ORDER — CYANOCOBALAMIN 1000 MCG/ML IJ SOLN
1000.0000 ug | Freq: Once | INTRAMUSCULAR | Status: AC
  Administered 2024-04-16: 1000 ug via INTRAMUSCULAR

## 2024-04-16 NOTE — Progress Notes (Signed)
 Patient is in office today for a nurse visit for B12 Injection. Patient Injection was given in the  Left deltoid. Patient tolerated injection well.

## 2024-04-30 NOTE — Progress Notes (Unsigned)
  HPI:      Ms. Megan Harper is a 63 y.o. [phone] who presents today for her pessary follow up and examination related to her pelvic floor weakening.  Pt reports tolerating the pessary well with  no vaginal bleeding and  no vaginal discharge.  Symptoms of pelvic floor weakening have greatly improved. She is voiding and defecating without difficulty. She currently has a size 3 ring with support  pessary.  PMHx: She  has a past medical history of Abnormal SPEP, Anxiety, Arthralgia, Cervical dysplasia, COPD (chronic obstructive pulmonary disease) (HCC), Emphysema of lung (HCC), Endometriosis, Fibrocystic breast disease, GERD (gastroesophageal reflux disease), Osteoporosis, Thyroid  nodule, and Vitamin D  insufficiency. Also,  has a past surgical history that includes laproscopy for endometriosis; Thyroid  lobectomy (Right, 03/08/2010); and Tubal ligation (01/23/1986)., family history includes Bladder Cancer in her sister; Colon cancer in her maternal grandfather; Diabetes in her paternal grandmother; Esophageal cancer in her maternal aunt; Heart disease in her maternal grandmother, maternal uncle, and paternal grandmother; Hypertension in her maternal grandfather and maternal grandmother; Kidney cancer in her maternal grandfather and maternal uncle; Lung cancer in her father; Lymphoma in her maternal aunt; Prostate cancer in her maternal grandfather and maternal uncle; Throat cancer in her maternal grandmother; Thyroid  disease in her maternal grandmother; Uterine cancer in her mother.,  reports that she quit smoking about 14 years ago. Her smoking use included cigarettes. She started smoking about 45 years ago. She has a 77.5 pack-year smoking history. She has never used smokeless tobacco. She reports that she does not drink alcohol and does not use drugs.  She has a current medication list which includes the following prescription(s): albuterol , cholecalciferol, fluticasone , loratadine , meloxicam , mometasone ,  omeprazole , and trimo-san. Also, is allergic to ceclor [cefaclor], influenza vaccines, and pantoprazole .  ROS  Objective: BP (!) 113/51   Pulse 63   Ht 5' 6.5 (1.689 m)   Wt 168 lb (76.2 kg)   BMI 26.71 kg/m  OBGyn Exam  Pessary Care Pessary removed and cleaned.  Vagina checked - without erosions - pessary replaced.  A/P:  ***Pessary was cleaned and replaced today. Instructions given for care. Concerning symptoms to observe for are counseled to patient. Follow up scheduled for 3 months with annual  ***Pessary holiday advised.  Will leave out and follow symptoms closely.  Replace in 4 weeks.  Estil Mangle, MD  [facility], [facility] 05/05/2024  3:07 PM

## 2024-05-05 ENCOUNTER — Encounter: Payer: Self-pay | Admitting: Obstetrics

## 2024-05-05 ENCOUNTER — Ambulatory Visit (INDEPENDENT_AMBULATORY_CARE_PROVIDER_SITE_OTHER): Admitting: Obstetrics

## 2024-05-05 VITALS — BP 113/51 | HR 63 | Ht 66.5 in | Wt 168.0 lb

## 2024-05-05 DIAGNOSIS — N812 Incomplete uterovaginal prolapse: Secondary | ICD-10-CM

## 2024-05-05 DIAGNOSIS — Z4689 Encounter for fitting and adjustment of other specified devices: Secondary | ICD-10-CM | POA: Diagnosis not present

## 2024-05-13 DIAGNOSIS — H3554 Dystrophies primarily involving the retinal pigment epithelium: Secondary | ICD-10-CM | POA: Diagnosis not present

## 2024-05-18 ENCOUNTER — Ambulatory Visit (INDEPENDENT_AMBULATORY_CARE_PROVIDER_SITE_OTHER)

## 2024-05-18 ENCOUNTER — Ambulatory Visit

## 2024-05-18 DIAGNOSIS — E538 Deficiency of other specified B group vitamins: Secondary | ICD-10-CM

## 2024-05-18 MED ORDER — CYANOCOBALAMIN 1000 MCG/ML IJ SOLN
1000.0000 ug | Freq: Once | INTRAMUSCULAR | Status: AC
  Administered 2024-05-18: 1000 ug via INTRAMUSCULAR

## 2024-05-18 NOTE — Progress Notes (Signed)
Pt received B12 injection in right deltoid muscle. Pt tolerated it well with no complaints or concerns.  

## 2024-06-17 ENCOUNTER — Ambulatory Visit (INDEPENDENT_AMBULATORY_CARE_PROVIDER_SITE_OTHER)

## 2024-06-17 DIAGNOSIS — E538 Deficiency of other specified B group vitamins: Secondary | ICD-10-CM

## 2024-06-17 MED ORDER — CYANOCOBALAMIN 1000 MCG/ML IJ SOLN
1000.0000 ug | Freq: Once | INTRAMUSCULAR | Status: AC
  Administered 2024-06-17: 1000 ug via INTRAMUSCULAR

## 2024-06-17 NOTE — Progress Notes (Signed)
 Pt received B12 injection in Left  deltoid muscle. Pt tolerated it well with no complaints or concerns.

## 2024-07-08 DIAGNOSIS — D2272 Melanocytic nevi of left lower limb, including hip: Secondary | ICD-10-CM | POA: Diagnosis not present

## 2024-07-08 DIAGNOSIS — D485 Neoplasm of uncertain behavior of skin: Secondary | ICD-10-CM | POA: Diagnosis not present

## 2024-07-08 DIAGNOSIS — D2261 Melanocytic nevi of right upper limb, including shoulder: Secondary | ICD-10-CM | POA: Diagnosis not present

## 2024-07-08 DIAGNOSIS — Z85828 Personal history of other malignant neoplasm of skin: Secondary | ICD-10-CM | POA: Diagnosis not present

## 2024-07-08 DIAGNOSIS — D2262 Melanocytic nevi of left upper limb, including shoulder: Secondary | ICD-10-CM | POA: Diagnosis not present

## 2024-07-08 DIAGNOSIS — D225 Melanocytic nevi of trunk: Secondary | ICD-10-CM | POA: Diagnosis not present

## 2024-07-20 ENCOUNTER — Ambulatory Visit (INDEPENDENT_AMBULATORY_CARE_PROVIDER_SITE_OTHER)

## 2024-07-20 DIAGNOSIS — E538 Deficiency of other specified B group vitamins: Secondary | ICD-10-CM

## 2024-07-20 MED ORDER — CYANOCOBALAMIN 1000 MCG/ML IJ SOLN
1000.0000 ug | Freq: Once | INTRAMUSCULAR | Status: AC
  Administered 2024-07-20: 1000 ug via INTRAMUSCULAR

## 2024-07-20 NOTE — Progress Notes (Signed)
 Pt received B12 injection in Left  deltoid muscle. Pt tolerated it well with no complaints or concerns.

## 2024-07-31 ENCOUNTER — Ambulatory Visit: Admitting: Family

## 2024-08-21 ENCOUNTER — Ambulatory Visit

## 2024-08-25 ENCOUNTER — Ambulatory Visit: Admitting: Family

## 2024-08-25 ENCOUNTER — Encounter: Payer: Self-pay | Admitting: Family

## 2024-08-25 VITALS — BP 112/70 | HR 63 | Temp 98.0°F | Ht 66.5 in | Wt 165.0 lb

## 2024-08-25 DIAGNOSIS — E538 Deficiency of other specified B group vitamins: Secondary | ICD-10-CM

## 2024-08-25 DIAGNOSIS — J439 Emphysema, unspecified: Secondary | ICD-10-CM | POA: Diagnosis not present

## 2024-08-25 DIAGNOSIS — Z136 Encounter for screening for cardiovascular disorders: Secondary | ICD-10-CM

## 2024-08-25 DIAGNOSIS — L299 Pruritus, unspecified: Secondary | ICD-10-CM | POA: Diagnosis not present

## 2024-08-25 DIAGNOSIS — D099 Carcinoma in situ, unspecified: Secondary | ICD-10-CM | POA: Insufficient documentation

## 2024-08-25 DIAGNOSIS — Z1211 Encounter for screening for malignant neoplasm of colon: Secondary | ICD-10-CM

## 2024-08-25 DIAGNOSIS — D472 Monoclonal gammopathy: Secondary | ICD-10-CM | POA: Diagnosis not present

## 2024-08-25 DIAGNOSIS — Z13220 Encounter for screening for lipoid disorders: Secondary | ICD-10-CM

## 2024-08-25 DIAGNOSIS — J4489 Other specified chronic obstructive pulmonary disease: Secondary | ICD-10-CM

## 2024-08-25 DIAGNOSIS — K219 Gastro-esophageal reflux disease without esophagitis: Secondary | ICD-10-CM | POA: Diagnosis not present

## 2024-08-25 DIAGNOSIS — C4491 Basal cell carcinoma of skin, unspecified: Secondary | ICD-10-CM | POA: Insufficient documentation

## 2024-08-25 DIAGNOSIS — M25511 Pain in right shoulder: Secondary | ICD-10-CM | POA: Diagnosis not present

## 2024-08-25 LAB — LIPID PANEL
Cholesterol: 188 mg/dL (ref 28–200)
HDL: 61.8 mg/dL
LDL Cholesterol: 116 mg/dL — ABNORMAL HIGH (ref 10–99)
NonHDL: 125.91
Total CHOL/HDL Ratio: 3
Triglycerides: 52 mg/dL (ref 10.0–149.0)
VLDL: 10.4 mg/dL (ref 0.0–40.0)

## 2024-08-25 LAB — B12 AND FOLATE PANEL
Folate: 11.3 ng/mL
Vitamin B-12: 458 pg/mL (ref 211–911)

## 2024-08-25 MED ORDER — MELOXICAM 7.5 MG PO TABS: 7.5000 mg | ORAL_TABLET | Freq: Every day | ORAL | 1 refills | Status: AC | PRN

## 2024-08-25 MED ORDER — OMEPRAZOLE 20 MG PO CPDR: 20.0000 mg | DELAYED_RELEASE_CAPSULE | Freq: Every day | ORAL | 1 refills | Status: AC | PRN

## 2024-08-25 MED ORDER — TRIAMCINOLONE ACETONIDE 0.025 % EX OINT: 1.0000 | TOPICAL_OINTMENT | Freq: Two times a day (BID) | CUTANEOUS | 0 refills | Status: AC

## 2024-08-25 NOTE — Assessment & Plan Note (Signed)
 Overdue for follow up with Dr Rennie. Patient understands to call to schedule follow up.

## 2024-08-25 NOTE — Progress Notes (Unsigned)
" ° °  Assessment & Plan:  There are no diagnoses linked to this encounter.   Return precautions given.   Risks, benefits, and alternatives of the medications and treatment plan prescribed today were discussed, and patient expressed understanding.   Education regarding symptom management and diagnosis given to patient on AVS either electronically or printed.  No follow-ups on file.  Rollene Northern, FNP  Subjective:    Patient ID: Megan Harper, female    DOB: [birth year], 63 y.o.   MRN: [id number]  CC: Megan Harper is a 63 y.o. female who presents today for follow up.   HPI: She feels well today No new complaints  She continues to get b12 IM monthly   She continues to have right shoulder pain She takes mobic  prn. She doesn't take daily Painful to sleep on right shoulder and to reach.     01/01/24 Xr cervical spine Minimal degenerative joint changes of lower cervical spine.  Xr right shoulder Mild AC joint degenerative change.   Mammogram UTD  Allergies: Ceclor [cefaclor], Influenza vaccines, and Pantoprazole  Medications Ordered Prior to Encounter[1]  Review of Systems    Objective:    BP 112/70   Pulse 63   Temp 98 F (36.7 C) (Oral)   Ht 5' 6.5 (1.689 m)   Wt 165 lb (74.8 kg)   SpO2 98%   BMI 26.23 kg/m  BP Readings from Last 3 Encounters:  08/25/24 112/70  05/05/24 (!) 113/51  01/30/24 130/70   Wt Readings from Last 3 Encounters:  08/25/24 165 lb (74.8 kg)  05/05/24 168 lb (76.2 kg)  01/30/24 174 lb 9.6 oz (79.2 kg)    Physical Exam        [1]  Current Outpatient Medications on File Prior to Visit  Medication Sig Dispense Refill   albuterol  (VENTOLIN  HFA) 108 (90 Base) MCG/ACT inhaler Inhale 2 puffs into the lungs every 6 (six) hours as needed for wheezing or shortness of breath. 18 g 3   cholecalciferol (VITAMIN D3) 25 MCG (1000 UNIT) tablet Take 1,000 Units by mouth daily.     fluticasone  (FLONASE ) 50 MCG/ACT nasal spray Place 2 sprays into  both nostrils daily. 16 g 6   loratadine  (CLARITIN ) 10 MG tablet Take 1 tablet (10 mg total) by mouth daily. 90 tablet 3   meloxicam  (MOBIC ) 7.5 MG tablet Take 1 tablet (7.5 mg total) by mouth daily as needed for pain. 30 tablet 1   mometasone  (ELOCON ) 0.1 % cream Use pea-sized amount in bilateral ear for itching as needed 15 g 1   omeprazole  (PRILOSEC) 20 MG capsule TAKE 1 CAPSULE BY MOUTH DAILY AS NEEDED 90 capsule 1   Oxyquinoline-Sod Lauryl Sulf (TRIMO-SAN) 0.025-0.01 % GEL Place 1 Applicatorful vaginally once a week. 113.4 g 3   No current facility-administered medications on file prior to visit.   "

## 2024-08-25 NOTE — Patient Instructions (Addendum)
 Overdue for follow up with Dr Rennie; please call to schedule. Referral placed.   Due for colonoscopy 09/2024 Referral placed to GI, Dr Norleen Kiang  I also placed referral to pulmonology, dermatology to continue ongoing care.  Please call to schedule these appointments.   Nice to see you today.

## 2024-08-26 ENCOUNTER — Ambulatory Visit: Payer: Self-pay | Admitting: Family

## 2024-08-26 ENCOUNTER — Encounter: Payer: Self-pay | Admitting: Family

## 2024-08-26 ENCOUNTER — Other Ambulatory Visit: Payer: Self-pay | Admitting: Family

## 2024-08-26 DIAGNOSIS — J302 Other seasonal allergic rhinitis: Secondary | ICD-10-CM

## 2024-08-26 NOTE — Assessment & Plan Note (Signed)
 Chronic, stable.  Referral back to pulmonology for ongoing surveillance.

## 2024-08-26 NOTE — Assessment & Plan Note (Signed)
 Chronic, unchanged.  Concern for rotator pathology which I shared with patient. She requires further evaluation with orthopedic. Referral placed .  Continue as needed use of meloxicam .

## 2024-08-26 NOTE — Assessment & Plan Note (Signed)
 B12 remains > 400.  Advise reasonable to move from IM to oral b12 1000mcg to sustain optimal levels of B12.

## 2024-09-22 ENCOUNTER — Ambulatory Visit: Admitting: Pulmonary Disease

## 2024-10-15 ENCOUNTER — Ambulatory Visit: Admitting: Pulmonary Disease

## 2024-11-13 ENCOUNTER — Other Ambulatory Visit

## 2024-11-27 ENCOUNTER — Ambulatory Visit: Admitting: Internal Medicine
